# Patient Record
Sex: Female | Born: 1935 | Race: White | Hispanic: No | Smoking: Former smoker
Health system: Southern US, Community
[De-identification: ages and names within clinical notes are randomized; demographics above are authoritative.]

## PROBLEM LIST (undated history)

## (undated) DIAGNOSIS — K219 Gastro-esophageal reflux disease without esophagitis: Secondary | ICD-10-CM

## (undated) DIAGNOSIS — I714 Abdominal aortic aneurysm, without rupture, unspecified: Secondary | ICD-10-CM

## (undated) DIAGNOSIS — I10 Essential (primary) hypertension: Secondary | ICD-10-CM

## (undated) DIAGNOSIS — I2510 Atherosclerotic heart disease of native coronary artery without angina pectoris: Secondary | ICD-10-CM

## (undated) HISTORY — PX: CHOLECYSTECTOMY: SHX55

## (undated) HISTORY — DX: Atherosclerotic heart disease of native coronary artery without angina pectoris: I25.10

## (undated) HISTORY — DX: Gastro-esophageal reflux disease without esophagitis: K21.9

## (undated) HISTORY — DX: Abdominal aortic aneurysm, without rupture, unspecified: I71.40

## (undated) HISTORY — PX: SMALL BOWEL REPAIR: SHX6447

## (undated) HISTORY — DX: Abdominal aortic aneurysm, without rupture: I71.4

---

## 2013-08-25 ENCOUNTER — Other Ambulatory Visit: Payer: Self-pay | Admitting: Family Medicine

## 2013-08-25 DIAGNOSIS — I714 Abdominal aortic aneurysm, without rupture, unspecified: Secondary | ICD-10-CM

## 2013-08-30 ENCOUNTER — Ambulatory Visit
Admission: RE | Admit: 2013-08-30 | Discharge: 2013-08-30 | Disposition: A | Discharge status: Home / Self Care | Payer: Medicare Other | Source: Ambulatory Visit | Attending: Family Medicine | Admitting: Family Medicine

## 2013-08-30 DIAGNOSIS — I714 Abdominal aortic aneurysm, without rupture, unspecified: Secondary | ICD-10-CM

## 2014-08-01 ENCOUNTER — Other Ambulatory Visit: Payer: Self-pay | Admitting: Family Medicine

## 2014-08-01 DIAGNOSIS — I714 Abdominal aortic aneurysm, without rupture, unspecified: Secondary | ICD-10-CM

## 2014-08-05 ENCOUNTER — Ambulatory Visit
Admission: RE | Admit: 2014-08-05 | Discharge: 2014-08-05 | Disposition: A | Discharge status: Home / Self Care | Payer: Medicare Other | Source: Ambulatory Visit | Attending: Family Medicine | Admitting: Family Medicine

## 2014-08-05 ENCOUNTER — Other Ambulatory Visit: Payer: Self-pay | Admitting: Family Medicine

## 2014-08-05 DIAGNOSIS — I714 Abdominal aortic aneurysm, without rupture, unspecified: Secondary | ICD-10-CM

## 2014-08-09 ENCOUNTER — Telehealth: Payer: Self-pay | Admitting: Internal Medicine

## 2014-08-09 NOTE — Telephone Encounter (Signed)
Received records from [facility] for appointment with Dr Rennis GoldenHilty on 09/08/14.  Records given to [employer] [facility] (medical records) for Dr [facility]'s schedule on 09/08/14. lp

## 2014-08-18 ENCOUNTER — Encounter: Payer: Self-pay | Admitting: Vascular Surgery

## 2014-09-05 ENCOUNTER — Encounter: Payer: Self-pay | Admitting: Vascular Surgery

## 2014-09-06 ENCOUNTER — Encounter: Payer: Self-pay | Admitting: Vascular Surgery

## 2014-09-06 ENCOUNTER — Ambulatory Visit (INDEPENDENT_AMBULATORY_CARE_PROVIDER_SITE_OTHER): Payer: Medicare Other | Admitting: Vascular Surgery

## 2014-09-06 VITALS — BP 178/80 | HR 49 | Ht 61.0 in | Wt 137.6 lb

## 2014-09-06 DIAGNOSIS — I714 Abdominal aortic aneurysm, without rupture, unspecified: Secondary | ICD-10-CM | POA: Insufficient documentation

## 2014-09-06 NOTE — Addendum Note (Signed)
Addended by: Sharee PimpleMCCHESNEY, MARILYN K on: 09/06/2014 03:01 PM   Modules accepted: Orders

## 2014-09-06 NOTE — Progress Notes (Signed)
Subjective:     Patient ID: Krystal Patrick, female   DOB: 08-09-1920, 81 y.o.   MRN: [id number]  HPI this 81 year old female was referred for evaluation of abdominal aortic aneurysm. This has been followed on ultrasound exams for the past 2 years. Apparently the diameter has increased from 3.1 cm in 2015 to 4.5 cm in April 2016. She had previously had an ultrasound in [place] which revealed the aneurysm to be 3.1 cm. She has no recent history of increasing abdominal pain. She does have chronic back pain due to osteoarthritis. She ambulates with a walker.  Past Medical History  Diagnosis Date  . AAA (abdominal aortic aneurysm)   . GERD (gastroesophageal reflux disease)   . CAD (coronary artery disease)     History  Substance Use Topics  . Smoking status: Former Smoker    Types: Cigarettes    Quit date: 09/05/1989  . Smokeless tobacco: Not on file  . Alcohol Use: No    Family History  Problem Relation Age of Onset  . Heart disease Father   . Heart attack Father   . Diabetes Daughter   . Hypertension Daughter     Allergies  Allergen Reactions  . Penicillins Rash     Current outpatient prescriptions:  .  alendronate (FOSAMAX) 70 MG tablet, Take 70 mg by mouth once a week. Take with a full glass of water on an empty stomach., Disp: , Rfl:  .  amLODipine (NORVASC) 5 MG tablet, Take 5 mg by mouth daily., Disp: , Rfl:  .  atorvastatin (LIPITOR) 10 MG tablet, Take 10 mg by mouth daily., Disp: , Rfl:  .  citalopram (CELEXA) 10 MG tablet, Take 10 mg by mouth daily. 1 or 2 tablets daily for depression, Disp: , Rfl:  .  furosemide (LASIX) 20 MG tablet, Take 20 mg by mouth daily., Disp: , Rfl:  .  lisinopril (PRINIVIL,ZESTRIL) 40 MG tablet, Take 40 mg by mouth daily., Disp: , Rfl:  .  metoprolol succinate (TOPROL-XL) 25 MG 24 hr tablet, Take 25 mg by mouth daily., Disp: , Rfl:   Filed Vitals:   09/06/14 0952  BP: 178/80  Pulse: 49  Height:  (1.549 m)  Weight: 137 lb 9.6 oz  (62.415 kg)  SpO2: 96%    Body mass index is 26.01 kg/(m^2).         Review of Systems denies chest pain. It is unstable with ambulation. Has not smoked in 25 years. Has no history of lateralizing weakness, a fascia, amaurosis fugax, diplopia, or syncope. Has 2 remote myocardial infarctions many years ago but has not had stents or surgery. Other systems negative and a complete review of systems     Objective:   Physical Exam BP 178/80 mmHg  Pulse 49  Ht  (1.549 m)  Wt 137 lb 9.6 oz (62.415 kg)  BMI 26.01 kg/m2  SpO2 96%  Gen.-alert and oriented x3 in no apparent distress-elderly-walks with a walker HEENT normal for age Lungs no rhonchi or wheezing Cardiovascular regular rhythm no murmurs carotid pulses 3+ palpable no bruits audible Abdomen soft nontender no palpable masses Musculoskeletal free of  major deformities Skin clear -no rashes Neurologic normal Lower extremities 3+ femoral and dorsalis pedis pulses palpable bilaterally with no edema  Today I reviewed the abdominal ultrasound which was performed a few weeks ago and compared this to the results of the study done in April 2015. Ultrasound now reveals aneurysm to be 4.5 cm in maximum diameter and  the report states that it was 3.1 cm in April 2015.       Assessment:     4.5 cm abdominal aortic aneurysm by ultrasound-has enlarged from 3.1 cm one year ago according to previous ultrasound report Coronary artery disease with remote history of 2 myocardial infarctions Ambulates with walker because of chronic back discomfort and osteoarthritis    Plan:     Discuss situation at length with patient and her daughter Have decided to have her return in 6 months with duplex scan of abdominal aortic aneurysm in our office and see nurse practitioner to look for any enlargement Have not recommended any surgical treatment but patient has stated that she does not want open surgery which I would agree with If enlargement  occurs to a significant amount may obtain a CT angiogram with contrast to see if she is candidate for stent graft but we'll have to await to see the results of duplex scan in 6 months Patient and daughter are in agreement with this plan

## 2014-09-08 ENCOUNTER — Ambulatory Visit: Payer: Medicare Other | Admitting: Internal Medicine

## 2015-02-08 ENCOUNTER — Other Ambulatory Visit: Payer: Self-pay | Admitting: Family Medicine

## 2015-02-08 DIAGNOSIS — G459 Transient cerebral ischemic attack, unspecified: Secondary | ICD-10-CM

## 2015-02-09 ENCOUNTER — Ambulatory Visit
Admission: RE | Admit: 2015-02-09 | Discharge: 2015-02-09 | Disposition: A | Discharge status: Home / Self Care | Payer: Medicare Other | Source: Ambulatory Visit | Attending: Family Medicine | Admitting: Family Medicine

## 2015-02-09 DIAGNOSIS — G459 Transient cerebral ischemic attack, unspecified: Secondary | ICD-10-CM

## 2015-02-12 ENCOUNTER — Ambulatory Visit
Admission: RE | Admit: 2015-02-12 | Discharge: 2015-02-12 | Disposition: A | Discharge status: Home / Self Care | Payer: Medicare Other | Source: Ambulatory Visit | Attending: Family Medicine | Admitting: Family Medicine

## 2015-02-12 DIAGNOSIS — G459 Transient cerebral ischemic attack, unspecified: Secondary | ICD-10-CM

## 2015-02-16 ENCOUNTER — Encounter: Payer: Self-pay | Admitting: Family

## 2015-02-21 ENCOUNTER — Encounter: Payer: Self-pay | Admitting: Vascular Surgery

## 2015-02-21 ENCOUNTER — Ambulatory Visit (INDEPENDENT_AMBULATORY_CARE_PROVIDER_SITE_OTHER): Payer: Medicare Other | Admitting: Vascular Surgery

## 2015-02-21 VITALS — BP 170/67 | HR 72 | Temp 98.6°F | Resp 14 | Ht 63.0 in | Wt 130.0 lb

## 2015-02-21 DIAGNOSIS — G459 Transient cerebral ischemic attack, unspecified: Secondary | ICD-10-CM

## 2015-02-21 NOTE — Progress Notes (Signed)
Subjective:     Patient ID: Krystal Patrick, female   DOB: 21-May-1920, 81 y.o.   MRN: [id number]  HPI This 81 year old female is known to me having seen her recently for Krystal  Small abdominal aortic aneurysm. She is due to return next month for further ultrasound exam. Several days ago she had an episode of transient Krystal Patrick with garbled speech lasting only Krystal few minutes according to her daughter. She had no lateralizing weakness or loss of vision or syncopal episodes or other symptoms. Her symptoms cleared completely. She was taking one aspirin per day at that time. She has no history of previous stroke or TIA. She had an MRI scan which revealed no evidence of acute stroke. She also had carotid ultrasound which revealed some mild bilateral carotid occlusive disease in the 20-50% range.  Past Medical History  Diagnosis Date  . AAA (abdominal aortic aneurysm) (HCC)   . GERD (gastroesophageal reflux disease)   . CAD (coronary artery disease)     Social History  Substance Use Topics  . Smoking status: Former Smoker    Types: Cigarettes    Quit date: 09/05/1989  . Smokeless tobacco: Never Used  . Alcohol Use: No    Family History  Problem Relation Age of Onset  . Heart disease Father   . Heart attack Father   . Diabetes Daughter   . Hypertension Daughter     Allergies  Allergen Reactions  . Penicillins Rash     Current outpatient prescriptions:  .  alendronate (FOSAMAX) 70 MG tablet, Take 70 mg by mouth once Krystal week. Take with Krystal full glass of water on an empty stomach., Disp: , Rfl:  .  amLODipine (NORVASC) 5 MG tablet, Take 5 mg by mouth daily., Disp: , Rfl:  .  atorvastatin (LIPITOR) 10 MG tablet, Take 10 mg by mouth daily., Disp: , Rfl:  .  citalopram (CELEXA) 10 MG tablet, Take 10 mg by mouth daily. 1 or 2 tablets daily for depression, Disp: , Rfl:  .  furosemide (LASIX) 20 MG tablet, Take 20 mg by mouth daily., Disp: , Rfl:  .  lisinopril (PRINIVIL,ZESTRIL) 40 MG  tablet, Take 40 mg by mouth daily., Disp: , Rfl:  .  metoprolol succinate (TOPROL-XL) 25 MG 24 hr tablet, Take 25 mg by mouth daily., Disp: , Rfl:   Filed Vitals:   02/21/15 1144 02/21/15 1147  BP: 140/101 170/67  Pulse: 53 72  Temp: 98.6 F (37 C)   Resp: 14   Height:  (1.6 m)   Weight: 130 lb (58.968 kg)   SpO2: 97%     Body mass index is 23.03 kg/(m^2).            Review of Systems Denies chest pain, dyspnea on exertion, PND, orthopnea, hemoptysis     Objective:   Physical Exam BP 170/67 mmHg  Pulse 72  Temp(Src) 98.6 F (37 C)  Resp 14  Ht  (1.6 m)  Wt 130 lb (58.968 kg)  BMI 23.03 kg/m2  SpO2 97%  Gen.-alert and oriented x3 in no apparent distress HEENT normal for age Lungs no rhonchi or wheezing Cardiovascular regular rhythm no murmurs carotid pulses 3+ palpable no bruits audible Abdomen soft nontender no palpable masses Musculoskeletal free of  major deformities Skin clear -no rashes Neurologic normal Lower extremities 3+ femoral and dorsalis pedis pulses palpable bilaterally with no edema   today I reviewed the clinical records supplied by Dr. Jeannetta Nap including the report  of the carotid ultrasound and the MRI study.   the results of these is outlined in the history of present illness      Assessment:      suspected left brain TIA lasting only Krystal few minutes which consisted of garbled speech which rapidly cleared -etiology unknown Mild bilateral carotid occlusive disease   Small abdominal aortic aneurysm    Plan:      certainly not Krystal lot of carotid occlusive disease to implicate left carotid stenosis and this left brain TIA. It is possible that she embolized Krystal small piece of debris from plaque in her left carotid artery which is not severely stenotic   I would recommend continuing daily aspirin. If she has another TIA I would add Plavix 75 mg pe day.  She will return to our office in 1 month for continued follow-up of the small  abdominal aortic aneurysm.

## 2015-03-07 ENCOUNTER — Encounter: Payer: Medicare Other | Admitting: Vascular Surgery

## 2015-03-10 ENCOUNTER — Encounter: Payer: Self-pay | Admitting: Family

## 2015-03-14 ENCOUNTER — Ambulatory Visit (INDEPENDENT_AMBULATORY_CARE_PROVIDER_SITE_OTHER): Payer: Medicare Other | Admitting: Family

## 2015-03-14 ENCOUNTER — Encounter: Payer: Self-pay | Admitting: Family

## 2015-03-14 ENCOUNTER — Ambulatory Visit (HOSPITAL_COMMUNITY)
Admission: RE | Admit: 2015-03-14 | Discharge: 2015-03-14 | Disposition: A | Discharge status: Home / Self Care | Payer: Medicare Other | Source: Ambulatory Visit | Attending: Family | Admitting: Family

## 2015-03-14 VITALS — BP 185/80 | HR 47 | Ht 63.0 in | Wt 131.0 lb

## 2015-03-14 DIAGNOSIS — I714 Abdominal aortic aneurysm, without rupture, unspecified: Secondary | ICD-10-CM

## 2015-03-14 DIAGNOSIS — G459 Transient cerebral ischemic attack, unspecified: Secondary | ICD-10-CM

## 2015-03-14 NOTE — Progress Notes (Signed)
[facility] [place]  Established Abdominal Aortic Aneurysm  History of Present Illness  Krystal Patrick is a 81 y.o. ([birth year]) female patient of Dr. Hart Rochester who is monitored for a small abdominal aortic aneurysm. She returns today for follow up. She last saw Dr. Hart Rochester in October 2016.  Several days prior to this visit she had an episode of transient ischemic attack with garbled speech lasting only a few minutes according to her daughter. She had no lateralizing weakness or loss of vision or syncopal episodes or other symptoms. Her symptoms cleared completely. She was taking one aspirin per day at that time. She has no history of previous stroke or TIA. She had an MRI scan which revealed no evidence of acute stroke. She also had carotid ultrasound which revealed some mild bilateral carotid occlusive disease in the 20-50% range.  Pt and daughter in law deny any subsequent TIA's.  Daughter in law states her mother in law's blood pressure runs 140/60 approximately. Pt has osteoporosis and chronic back pain from this. She denies abdominal pain.  Pt Diabetic: No Pt smoker: former smoker, quit about 1980  She takes a daily ASA and a statin.  Past Medical History  Diagnosis Date  . AAA (abdominal aortic aneurysm) (HCC)   . GERD (gastroesophageal reflux disease)   . CAD (coronary artery disease)    Past Surgical History  Procedure Laterality Date  . Cholecystectomy    . Small bowel repair     Social History Social History   Social History  . Marital Status: Widowed    Spouse Name: N/A  . Number of Children: N/A  . Years of Education: N/A   Occupational History  . Not on file.   Social History Main Topics  . Smoking status: Former Smoker    Types: Cigarettes    Quit date: 09/05/1989  . Smokeless tobacco: Never Used  . Alcohol Use: No  . Drug Use: No  . Sexual Activity: Not on file   Other Topics Concern  . Not on file   Social History Narrative    Family History Family History  Problem Relation Age of Onset  . Heart disease Father   . Heart attack Father   . Diabetes Daughter   . Hypertension Daughter     Current Outpatient Prescriptions on File Prior to Visit  Medication Sig Dispense Refill  . alendronate (FOSAMAX) 70 MG tablet Take 70 mg by mouth once a week. Take with a full glass of water on an empty stomach.    Marland Kitchen amLODipine (NORVASC) 5 MG tablet Take 5 mg by mouth daily.    Marland Kitchen atorvastatin (LIPITOR) 10 MG tablet Take 10 mg by mouth daily.    . citalopram (CELEXA) 10 MG tablet Take 10 mg by mouth daily. 1 or 2 tablets daily for depression    . furosemide (LASIX) 20 MG tablet Take 20 mg by mouth daily.    Marland Kitchen lisinopril (PRINIVIL,ZESTRIL) 40 MG tablet Take 40 mg by mouth daily.    . metoprolol succinate (TOPROL-XL) 25 MG 24 hr tablet Take 25 mg by mouth daily.     No current facility-administered medications on file prior to visit.   Allergies  Allergen Reactions  . Penicillins Rash    ROS: See HPI for pertinent positives and negatives.  Physical Examination  Filed Vitals:   03/14/15 0840  BP: 219/85  Pulse: 47  Height:  (1.6 m)  Weight: 131 lb (59.421 kg)  SpO2: 95%  Body mass index is 23.21 kg/(m^2).  General: A&O x 3, WD. Appears considerably younger than stated age.  Pulmonary: Sym exp, good air movt, CTAB, no rales, rhonchi, or wheezing.  Cardiac: RRR, Nl S1, S2, no detected murmur.   Carotid Bruits Right Left   Negative Negative   Aorta is not palpable Radial pulses are 2+ palpable and =.                          VASCULAR EXAM:                                                                                                         LE Pulses Right Left       FEMORAL  2+ palpable  2+ palpable        POPLITEAL  not palpable   not palpable       POSTERIOR TIBIAL  not palpable   2+ palpable        DORSALIS PEDIS      ANTERIOR TIBIAL not palpable  not palpable      Gastrointestinal:  soft, NTND, -G/R, - HSM, - masses palpated, - CVAT B.  Musculoskeletal: M/S 4/5 throughout extremities without ischemic changes.  Neurologic: CN 2-12 intact except is hard of hearing, Pain and light touch intact in extremities are intact, Motor exam as listed above.   Non-Invasive Vascular Imaging  AAA Duplex (03/14/2015) ABDOMINAL AORTA DUPLEX EVALUATION    INDICATION: Evaluation of known abdominal aortic aneurysm    PREVIOUS INTERVENTION(S):     DUPLEX EXAM:     LOCATION DIAMETER AP (cm) DIAMETER TRANSVERSE (cm) VELOCITIES (cm/sec)  Aorta Proximal 2.63 2.09 66  Aorta Mid 3.44 3.36 126  Aorta Distal 3.09 3.17 32  Right Common Iliac Artery 0.9 1.29 176  Left Common Iliac Artery 1.0 0.8 126    Previous max aortic diameter:   Date:      ADDITIONAL FINDINGS: Difficult exam due to significant bowel gas.    IMPRESSION: Abdominal aortic aneurysm measuring 3.44 cm AP x 3.36 cm in the largest transverse measurement.      Compared to the previous exam:  There are no previous exam.     Medical Decision Making  The patient is a 81 y.o. female who presents with asymptomatic small AAA, largest diameter measuring 3.44 cm.  At pt's October 2016 visit she had a carotid duplex performed.  Dr. Hart Rochester indicated there was not a lot of carotid occlusive disease to implicate left carotid stenosis and this left brain TIA. It is possible that she embolized a small piece of debris from plaque in her left carotid artery which is not severely stenotic.  Dr. Hart Rochester recommended continuing daily aspirin. If she has another TIA he would add Plavix 75 mg pe day. No further carotid studies recommended.  I advised pt and her daughter in law that pt needs to see her PCP ASAP re her elevated blood pressure.   Based on this patient's exam and diagnostic studies, the patient will follow up in  1 year  with the following studies: AAA duplex.  Consideration for repair of AAA would be made when the size is 5.5  cm, growth > 1 cm/yr, and symptomatic status.  I emphasized the importance of maximal medical management including strict control of blood pressure, blood glucose, and lipid levels, antiplatelet agents, obtaining regular exercise, and continued cessation of smoking.   The patient was given information about AAA including signs, symptoms, treatment, and how to minimize the risk of enlargement and rupture of aneurysms.    The patient was advised to call 911 should the patient experience sudden onset abdominal or back pain.   Thank you for allowing us to participate in this patient's care.  Charisse MarchSuzanne Nickel, RN, MSN, FNP-C [facility] [place] Office: [phone]  Clinic Physician: Hart RochesterLawson  03/14/2015, 8:50 AM    \

## 2015-03-14 NOTE — Patient Instructions (Signed)
Abdominal Aortic Aneurysm An aneurysm is a weakened or damaged part of an artery wall that bulges from the normal force of blood pumping through the body. An abdominal aortic aneurysm is an aneurysm that occurs in the lower part of the aorta, the main artery of the body.  The major concern with an abdominal aortic aneurysm is that it can enlarge and burst (rupture) or blood can flow between the layers of the wall of the aorta through a tear (aorticdissection). Both of these conditions can cause bleeding inside the body and can be life threatening unless diagnosed and treated promptly. CAUSES  The exact cause of an abdominal aortic aneurysm is unknown. Some contributing factors are:   A hardening of the arteries caused by the buildup of fat and other substances in the lining of a blood vessel (arteriosclerosis).  Inflammation of the walls of an artery (arteritis).   Connective tissue diseases, such as Marfan syndrome.   Abdominal trauma.   An infection, such as syphilis or staphylococcus, in the wall of the aorta (infectious aortitis) caused by bacteria. RISK FACTORS  Risk factors that contribute to an abdominal aortic aneurysm may include:  Age older than 60 years.   High blood pressure (hypertension).  Female gender.  Ethnicity (white race).  Obesity.  Family history of aneurysm (first degree relatives only).  Tobacco use. PREVENTION  The following healthy lifestyle habits may help decrease your risk of abdominal aortic aneurysm:  Quitting smoking. Smoking can raise your blood pressure and cause arteriosclerosis.  Limiting or avoiding alcohol.  Keeping your blood pressure, blood sugar level, and cholesterol levels within normal limits.  Decreasing your salt intake. In somepeople, too much salt can raise blood pressure and increase your risk of abdominal aortic aneurysm.  Eating a diet low in saturated fats and cholesterol.  Increasing your fiber intake by including  whole grains, vegetables, and fruits in your diet. Eating these foods may help lower blood pressure.  Maintaining a healthy weight.  Staying physically active and exercising regularly. SYMPTOMS  The symptoms of abdominal aortic aneurysm may vary depending on the size and rate of growth of the aneurysm.Most grow slowly and do not have any symptoms. When symptoms do occur, they may include:  Pain (abdomen, side, lower back, or groin). The pain may vary in intensity. A sudden onset of severe pain may indicate that the aneurysm has ruptured.  Feeling full after eating only small amounts of food.  Nausea or vomiting or both.  Feeling a pulsating lump in the abdomen.  Feeling faint or passing out. DIAGNOSIS  Since most unruptured abdominal aortic aneurysms have no symptoms, they are often discovered during diagnostic exams for other conditions. An aneurysm may be found during the following procedures:  Ultrasonography (A one-time screening for abdominal aortic aneurysm by ultrasonography is also recommended for all men aged 65-75 years who have ever smoked).  X-ray exams.  A computed tomography (CT).  Magnetic resonance imaging (MRI).  Angiography or arteriography. TREATMENT  Treatment of an abdominal aortic aneurysm depends on the size of your aneurysm, your age, and risk factors for rupture. Medication to control blood pressure and pain may be used to manage aneurysms smaller than 6 cm. Regular monitoring for enlargement may be recommended by your caregiver if:  The aneurysm is 3-4 cm in size (an annual ultrasonography may be recommended).  The aneurysm is 4-4.5 cm in size (an ultrasonography every 6 months may be recommended).  The aneurysm is larger than 4.5 cm in   size (your caregiver may ask that you be examined by a vascular surgeon). If your aneurysm is larger than 6 cm, surgical repair may be recommended. There are two main methods for repair of an aneurysm:   Endovascular  repair (a minimally invasive surgery). This is done most often.  Open repair. This method is used if an endovascular repair is not possible.   This information is not intended to replace advice given to you by your health care provider. Make sure you discuss any questions you have with your health care provider.   Document Released: 01/30/2005 Document Revised: 08/17/2012 Document Reviewed: 05/22/2012 Elsevier Interactive Patient Education 2016 [employer]  

## 2016-03-13 ENCOUNTER — Encounter: Payer: Self-pay | Admitting: Family

## 2016-03-15 ENCOUNTER — Encounter: Payer: Self-pay | Admitting: Family

## 2016-03-19 ENCOUNTER — Ambulatory Visit (INDEPENDENT_AMBULATORY_CARE_PROVIDER_SITE_OTHER): Payer: Medicare Other | Admitting: Family

## 2016-03-19 ENCOUNTER — Encounter: Payer: Self-pay | Admitting: Family

## 2016-03-19 ENCOUNTER — Ambulatory Visit: Payer: Medicare Other | Admitting: Family

## 2016-03-19 ENCOUNTER — Ambulatory Visit (HOSPITAL_COMMUNITY)
Admission: RE | Admit: 2016-03-19 | Discharge: 2016-03-19 | Disposition: A | Discharge status: Home / Self Care | Payer: Medicare Other | Source: Ambulatory Visit | Attending: Family | Admitting: Family

## 2016-03-19 ENCOUNTER — Other Ambulatory Visit (HOSPITAL_COMMUNITY): Payer: Medicare Other

## 2016-03-19 VITALS — BP 139/69 | HR 61 | Temp 97.7°F | Resp 18 | Ht 63.0 in | Wt 128.8 lb

## 2016-03-19 DIAGNOSIS — I714 Abdominal aortic aneurysm, without rupture, unspecified: Secondary | ICD-10-CM

## 2016-03-19 DIAGNOSIS — G459 Transient cerebral ischemic attack, unspecified: Secondary | ICD-10-CM | POA: Diagnosis present

## 2016-03-19 NOTE — Addendum Note (Signed)
Addended by: Sharee PimpleMCCHESNEY, MARILYN K on: 03/19/2016 04:27 PM   Modules accepted: Orders

## 2016-03-19 NOTE — Progress Notes (Signed)
[facility]   CC: Follow up Abdominal Aortic Aneurysm  History of Present Illness  Krystal SavoyBetty Patrick is a 82 y.o. (Jul 23, 1920) female patient of Dr. Hart RochesterLawson who is monitored for a small abdominal aortic aneurysm. She returns today for follow up. She last saw Dr. Hart RochesterLawson in October 2016.  Several days prior to this visit she had an episode of transient ischemic attack with garbled speech lasting only a few minutes according to her daughter. She had no lateralizing weakness or loss of vision or syncopal episodes or other symptoms. Her symptoms cleared completely. She was taking one aspirin per day at that time. She has no history of previous stroke or TIA. She had an MRI scan which revealed no evidence of acute stroke. She also had carotid ultrasound which revealed some mild bilateral carotid occlusive disease in the 20-50% range.  Pt and daughter in law deny any subsequent TIA's.  Daughter in law states her mother in law's blood pressure runs 140/60 approximately. Pt has osteoporosis and chronic back pain from this. She denies abdominal pain.  Pt Diabetic: No Pt smoker: former smoker, quit about 1980  She takes a daily ASA and a statin.    Past Medical History:  Diagnosis Date  . AAA (abdominal aortic aneurysm) (HCC)   . CAD (coronary artery disease)   . GERD (gastroesophageal reflux disease)    Past Surgical History:  Procedure Laterality Date  . CHOLECYSTECTOMY    . SMALL BOWEL REPAIR     Social History Social History   Social History  . Marital status: Widowed    Spouse name: N/A  . Number of children: N/A  . Years of education: N/A   Occupational History  . Not on file.   Social History Main Topics  . Smoking status: Former Smoker    Types: Cigarettes    Quit date: 09/05/1989  . Smokeless tobacco: Never Used  . Alcohol use No  . Drug use: No  . Sexual activity: Not on file   Other Topics Concern  . Not on file   Social History  Narrative  . No narrative on file   Family History Family History  Problem Relation Age of Onset  . Heart disease Father   . Heart attack Father   . Diabetes Daughter   . Hypertension Daughter     Current Outpatient Prescriptions on File Prior to Visit  Medication Sig Dispense Refill  . alendronate (FOSAMAX) 70 MG tablet Take 70 mg by mouth once a week. Take with a full glass of water on an empty stomach.    Marland Kitchen. amLODipine (NORVASC) 5 MG tablet Take 5 mg by mouth daily.    Marland Kitchen. atorvastatin (LIPITOR) 10 MG tablet Take 10 mg by mouth daily.    . citalopram (CELEXA) 10 MG tablet Take 10 mg by mouth daily. 1 or 2 tablets daily for depression    . furosemide (LASIX) 20 MG tablet Take 20 mg by mouth daily.    Marland Kitchen. lisinopril (PRINIVIL,ZESTRIL) 40 MG tablet Take 40 mg by mouth daily.    . metoprolol succinate (TOPROL-XL) 25 MG 24 hr tablet Take 25 mg by mouth daily.     No current facility-administered medications on file prior to visit.    Allergies  Allergen Reactions  . Penicillins Rash    ROS: See HPI for pertinent positives and negatives.  Physical Examination  Vitals:   03/19/16 0936  BP: 139/69  Pulse: 61  Resp: 18  Temp: 97.7  F (36.5 C)  TempSrc: Oral  SpO2: 96%  Weight: 128 lb 12.8 oz (58.4 kg)  Height: 5\' 3"  (1.6 m)   Body mass index is 22.82 kg/m.  General: A&O x 3, WD. Appears considerably younger than stated age.  Pulmonary: Sym exp, respirations are non labored, good air movt, CTAB, no rales, rhonchi, or wheezing.  Cardiac: RRR, Nl S1, S2, no detected murmur.   Carotid Bruits Right Left   positive Negative   Aorta is not palpable Radial pulses are 2+ palpable and =.                          VASCULAR EXAM:                                                                                                                                           LE Pulses Right Left       FEMORAL  2+ palpable  2+ palpable       POPLITEAL  not palpable  not palpable        POSTERIOR TIBIAL  not palpable  1+ palpable       DORSALIS PEDIS      ANTERIOR TIBIAL not palpable not palpable     Gastrointestinal: soft, NTND, -G/R, - HSM, - masses palpated, - CVAT B.  Musculoskeletal: M/S 4/5 throughout extremities without ischemic changes.  Neurologic: CN 2-12 intact except is hard of hearing, Pain and light touch intact in extremities are intact, Motor exam as listed above.    Non-Invasive Vascular Imaging  AAA Duplex (03/19/2016)  Previous size: 3.44 cm (Date: 03-14-15)  Current size:  3.5 cm (Date: 03-19-16) Decreased visualization due to overlying bowel gas and patient body habitus.   No significant stenosis of the abdominal aorta and bilateral proximal common iliac arteries. Normal diameters of the bilateral CIA's.   Medical Decision Making  The patient is a 82 y.o. female who presents with asymptomatic AAA with no increase in size, largest diameter of abdominal aorta is 3.5 cm, based on limited visualization. Pt and daughter in las with that pt return in a year instead of 18 months.   At pt's October 2016 visit she had a carotid duplex performed.  Dr. Hart RochesterLawson indicated there was not a lot of carotid occlusive disease to implicate left carotid stenosis and this left brain TIA. It is possible that she embolized a small piece of debris from plaque in her left carotid artery which is not severely stenotic.  Dr. Hart RochesterLawson recommended continuing daily aspirin. If she has another TIA he would add Plavix 75 mg pe day. No further carotid studies recommended.    Based on this patient's exam and diagnostic studies, the patient will follow up in 1 year  with the following studies: AAA duplex.  Consideration for repair of AAA would be made when the size  is 5.0 cm, growth > 1 cm/yr, and symptomatic status.  I emphasized the importance of maximal medical management including strict control of blood pressure, blood glucose, and lipid levels, antiplatelet agents,  obtaining regular exercise, and continued cessation of smoking.   The patient was given information about AAA including signs, symptoms, treatment, and how to minimize the risk of enlargement and rupture of aneurysms.    The patient was advised to call 911 should the patient experience sudden onset abdominal or back pain.   Thank you for allowing [place] to participate in this patient's care.  Charisse March, RN, MSN, FNP-C [facility] Office: [phone]  Clinic Physician: Edilia Bo on call  03/19/2016, 9:40 AM

## 2016-03-19 NOTE — Patient Instructions (Addendum)

## 2016-04-08 ENCOUNTER — Ambulatory Visit
Admission: RE | Admit: 2016-04-08 | Discharge: 2016-04-08 | Disposition: A | Discharge status: Home / Self Care | Payer: Medicare Other | Source: Ambulatory Visit | Attending: Family Medicine | Admitting: Family Medicine

## 2016-04-08 ENCOUNTER — Other Ambulatory Visit: Payer: Self-pay | Admitting: Family Medicine

## 2016-04-08 DIAGNOSIS — M549 Dorsalgia, unspecified: Secondary | ICD-10-CM

## 2017-03-25 ENCOUNTER — Encounter: Payer: Self-pay | Admitting: Family

## 2017-03-25 ENCOUNTER — Ambulatory Visit (HOSPITAL_COMMUNITY)
Admission: RE | Admit: 2017-03-25 | Discharge: 2017-03-25 | Disposition: A | Discharge status: Home / Self Care | Payer: Medicare Other | Source: Ambulatory Visit | Attending: Family | Admitting: Family

## 2017-03-25 ENCOUNTER — Ambulatory Visit (INDEPENDENT_AMBULATORY_CARE_PROVIDER_SITE_OTHER): Payer: Medicare Other | Admitting: Family

## 2017-03-25 VITALS — BP 180/76 | HR 53 | Temp 97.3°F | Resp 17 | Wt 132.9 lb

## 2017-03-25 DIAGNOSIS — I714 Abdominal aortic aneurysm, without rupture, unspecified: Secondary | ICD-10-CM

## 2017-03-25 DIAGNOSIS — I7409 Other arterial embolism and thrombosis of abdominal aorta: Secondary | ICD-10-CM | POA: Diagnosis not present

## 2017-03-25 NOTE — Progress Notes (Signed)
[facility] [place]   CC: Follow up Abdominal Aortic Aneurysm  History of Present Illness  Krystal Patrick is a 83 y.o. (December 20, 1920) female whom Dr. Hart RochesterLawson had been monitoring for a small abdominal aortic aneurysm. She returns today for follow up. She last saw Dr. Hart RochesterLawson in October 2016. Several days prior to this visit she had an episode of transient ischemic attack with garbled speech lasting only a few minutes according to her daughter. She had no lateralizing weakness or loss of vision or syncopal episodes or other symptoms. Her symptoms cleared completely. She was taking one aspirin per day at that time. She has no history of previous stroke or TIA. She had an MRI scan which revealed no evidence of acute stroke. She also had carotid ultrasound which revealed some mild bilateral carotid occlusive disease in the 20-50% range.   Pt and daughter in law deny any subsequent TIA's.  Daughter in law states her mother in law's blood pressure runs 140/60 approximately. Pt states she feels anxious today.  Pt has osteoporosis and chronic back pain from this. She denies abdominal pain.  Pt Diabetic: No Pt smoker: former smoker, quit about 1980  She takes a daily ASA and a statin   Past Medical History:  Diagnosis Date  . AAA (abdominal aortic aneurysm) (HCC)   . CAD (coronary artery disease)   . GERD (gastroesophageal reflux disease)    Past Surgical History:  Procedure Laterality Date  . CHOLECYSTECTOMY    . SMALL BOWEL REPAIR     Social History Social History   Socioeconomic History  . Marital status: Widowed    Spouse name: Not on file  . Number of children: Not on file  . Years of education: Not on file  . Highest education level: Not on file  Social Needs  . Financial resource strain: Not on file  . Food insecurity - worry: Not on file  . Food insecurity - inability: Not on file  . Transportation needs - medical: Not on file  . Transportation  needs - non-medical: Not on file  Occupational History  . Not on file  Tobacco Use  . Smoking status: Former Smoker    Types: Cigarettes    Last attempt to quit: 09/05/1989    Years since quitting: 27.5  . Smokeless tobacco: Never Used  Substance and Sexual Activity  . Alcohol use: No    Alcohol/week: 0.0 oz  . Drug use: No  . Sexual activity: Not on file  Other Topics Concern  . Not on file  Social History Narrative  . Not on file   Family History Family History  Problem Relation Age of Onset  . Heart disease Father   . Heart attack Father   . Diabetes Daughter   . Hypertension Daughter     Current Outpatient Medications on File Prior to Visit  Medication Sig Dispense Refill  . alendronate (FOSAMAX) 70 MG tablet Take 70 mg by mouth once a week. Take with a full glass of water on an empty stomach.    Marland Kitchen. amLODipine (NORVASC) 5 MG tablet Take 5 mg by mouth daily.    Marland Kitchen. atorvastatin (LIPITOR) 10 MG tablet Take 10 mg by mouth daily.    . citalopram (CELEXA) 10 MG tablet Take 10 mg by mouth daily. 1 or 2 tablets daily for depression    . furosemide (LASIX) 20 MG tablet Take 20 mg by mouth daily.    Marland Kitchen. lisinopril (PRINIVIL,ZESTRIL) 40 MG tablet Take  40 mg by mouth daily.    . metoprolol succinate (TOPROL-XL) 25 MG 24 hr tablet Take 25 mg by mouth daily.     No current facility-administered medications on file prior to visit.    Allergies  Allergen Reactions  . Penicillins Rash    ROS: See HPI for pertinent positives and negatives.  Physical Examination  Vitals:   03/25/17 0843 03/25/17 0849  BP: (!) 170/80 (!) 180/76  Pulse: (!) 53   Resp: 17   Temp: (!) 97.3 F (36.3 C)   TempSrc: Oral   SpO2: 97%   Weight: 132 lb 14.4 oz (60.3 kg)    Body mass index is 23.54 kg/m.  General: A&O x 3, WD. Appears considerably younger than stated age.  Pulmonary: Sym exp, respirations are non labored, good air movt, CTAB, few scattered rales in bases, no rhonchi, or  wheezing.  Cardiac: RRR, Nl S1, S2, no detected murmur.   Carotid Bruits Right Left   negative Negative   Abdominal aortic pulse is not palpable Radial pulses are 2+ palpable and =.   VASCULAR EXAM:  LE Pulses Right Left  FEMORAL 2+ palpable 2+ palpable  POPLITEAL not palpable not palpable  POSTERIOR TIBIAL 1+palpable 1+ palpable  DORSALIS PEDIS ANTERIOR TIBIAL 2+ palpable not palpable     Gastrointestinal: soft, NTND, -G/R, - HSM, - masses palpated, - CVAT B.  Musculoskeletal: M/S 4/5 throughout extremities without ischemic changes.  Skin: No rashes, no cellulitis, no ulcers noted.   Neurologic: CN 2-12 intact except is hard of hearing, Pain and light touch intact in extremities are intact, Motor exam as listed above    DATA  AAA Duplex (03/25/2017):  Previous size: 3.5 cm (Date: 03-19-16) Decreased visualization due to overlying bowel gas and patient body habitus.   No significant stenosis of the abdominal aorta and bilateral proximal common iliac arteries. Normal diameters of the bilateral CIA's.    Current size:  3.1 cm (Date: 03/25/17), Right CIA: 1.1; Left CIA: 1.1. Decreased visualization of the abdominal vasculature due to overlying bowel gas and patient body habitus. Thrombus visualized in the distal aorta.   No significant change compared to the exam on 03-19-16.   Medical Decision Making  The patient is a 83 y.o. female who presents with asymptomatic small AAA with no increase in size, based on limited visualization, 3.1 cm today. .  Pt reports she has had a cough "for a long time", denies dyspnea, lung sounds with scattered rales in bases. I advised pt and her daughter that pt see her PCP about her lung congestion, cough, and elevated blood pressure.    Based on this patient's exam and diagnostic studies, the patient will follow up in 1 year  with the following studies: AAA  duplex.  Consideration for repair of AAA would be made when the size is 5.0 cm, growth > 1 cm/yr, and symptomatic status.        The patient was given information about AAA including signs, symptoms, treatment, and how to minimize the risk of enlargement and rupture of aneurysms.    I emphasized the importance of maximal medical management including strict control of blood pressure, blood glucose, and lipid levels, antiplatelet agents, obtaining regular exercise, and continued  cessation of smoking.   The patient was advised to call 911 should the patient experience sudden onset abdominal or back pain.   Thank you for allowing [place] to participate in this patient's care.  Rosalita Chessman Shaunak Kreis, RN, MSN, FNP-C [facility] [place] Office: [phone]  Clinic Physician: Myra GianottiBrabham on call  03/25/2017, 8:55 AM

## 2017-03-25 NOTE — Patient Instructions (Addendum)
Before your next abdominal ultrasound:  Take two Extra-Strength Gas-X capsules at bedtime the night before the test. Take another two Extra-Strength Gas-X capsules 3 hours before the test.  Avoid gas forming foods the day before the test.       Abdominal Aortic Aneurysm Blood pumps away from the heart through tubes (blood vessels) called arteries. Aneurysms are weak or damaged places in the wall of an artery. It bulges out like a balloon. An abdominal aortic aneurysm happens in the main artery of the body (aorta). It can burst or tear, causing bleeding inside the body. This is an emergency. It needs treatment right away. What are the causes? The exact cause is unknown. Things that could cause this problem include:  Fat and other substances building up in the lining of a tube.  Swelling of the walls of a blood vessel.  Certain tissue diseases.  Belly (abdominal) trauma.  An infection in the main artery of the body.  What increases the risk? There are things that make it more likely for you to have an aneurysm. These include:  Being over the age of 81 years old.  Having high blood pressure (hypertension).  Being a female.  Being white.  Being very overweight (obese).  Having a family history of aneurysm.  Using tobacco products.  What are the signs or symptoms? Symptoms depend on the size of the aneurysm and how fast it grows. There may not be symptoms. If symptoms occur, they can include:  Pain (belly, side, lower back, or groin).  Feeling full after eating a small amount of food.  Feeling sick to your stomach (nauseous), throwing up (vomiting), or both.  Feeling a lump in your belly that feels like it is beating (pulsating).  Feeling like you will pass out (faint).  How is this treated?  Medicine to control blood pressure and pain.  Imaging tests to see if the aneurysm gets bigger.  Surgery. How is this prevented? To lessen your chance of getting this  condition:  Stop smoking. Stop chewing tobacco.  Limit or avoid alcohol.  Keep your blood pressure, blood sugar, and cholesterol within normal limits.  Eat less salt.  Eat foods low in saturated fats and cholesterol. These are found in animal and whole dairy products.  Eat more fiber. Fiber is found in whole grains, vegetables, and fruits.  Keep a healthy weight.  Stay active and exercise often.  This information is not intended to replace advice given to you by your health care provider. Make sure you discuss any questions you have with your health care provider. Document Released: 08/17/2012 Document Revised: 09/28/2015 Document Reviewed: 05/22/2012 Elsevier Interactive Patient Education  2017 [employer]  

## 2017-05-30 ENCOUNTER — Emergency Department (HOSPITAL_COMMUNITY): Payer: Medicare Other

## 2017-05-30 ENCOUNTER — Encounter (HOSPITAL_COMMUNITY): Payer: Self-pay

## 2017-05-30 ENCOUNTER — Inpatient Hospital Stay (HOSPITAL_COMMUNITY)
Admission: EM | Admit: 2017-05-30 | Discharge: 2017-06-03 | DRG: 689 | Disposition: A | Discharge status: Home Health | Payer: Medicare Other | Attending: Internal Medicine | Admitting: Internal Medicine

## 2017-05-30 DIAGNOSIS — R269 Unspecified abnormalities of gait and mobility: Secondary | ICD-10-CM | POA: Diagnosis present

## 2017-05-30 DIAGNOSIS — N390 Urinary tract infection, site not specified: Secondary | ICD-10-CM | POA: Diagnosis not present

## 2017-05-30 DIAGNOSIS — I160 Hypertensive urgency: Secondary | ICD-10-CM | POA: Diagnosis present

## 2017-05-30 DIAGNOSIS — Z880 Allergy status to penicillin: Secondary | ICD-10-CM

## 2017-05-30 DIAGNOSIS — E875 Hyperkalemia: Secondary | ICD-10-CM | POA: Diagnosis present

## 2017-05-30 DIAGNOSIS — F329 Major depressive disorder, single episode, unspecified: Secondary | ICD-10-CM | POA: Diagnosis present

## 2017-05-30 DIAGNOSIS — I10 Essential (primary) hypertension: Secondary | ICD-10-CM | POA: Diagnosis present

## 2017-05-30 DIAGNOSIS — I714 Abdominal aortic aneurysm, without rupture: Secondary | ICD-10-CM | POA: Diagnosis present

## 2017-05-30 DIAGNOSIS — M545 Low back pain: Secondary | ICD-10-CM | POA: Diagnosis present

## 2017-05-30 DIAGNOSIS — L8990 Pressure ulcer of unspecified site, unspecified stage: Secondary | ICD-10-CM

## 2017-05-30 DIAGNOSIS — B9689 Other specified bacterial agents as the cause of diseases classified elsewhere: Secondary | ICD-10-CM | POA: Diagnosis present

## 2017-05-30 DIAGNOSIS — Z9181 History of falling: Secondary | ICD-10-CM

## 2017-05-30 DIAGNOSIS — N3000 Acute cystitis without hematuria: Secondary | ICD-10-CM | POA: Diagnosis not present

## 2017-05-30 DIAGNOSIS — Z79899 Other long term (current) drug therapy: Secondary | ICD-10-CM

## 2017-05-30 DIAGNOSIS — F32A Depression, unspecified: Secondary | ICD-10-CM | POA: Diagnosis present

## 2017-05-30 DIAGNOSIS — R110 Nausea: Secondary | ICD-10-CM | POA: Diagnosis present

## 2017-05-30 DIAGNOSIS — Z7982 Long term (current) use of aspirin: Secondary | ICD-10-CM

## 2017-05-30 DIAGNOSIS — G9341 Metabolic encephalopathy: Secondary | ICD-10-CM | POA: Diagnosis not present

## 2017-05-30 DIAGNOSIS — R52 Pain, unspecified: Secondary | ICD-10-CM

## 2017-05-30 DIAGNOSIS — I2510 Atherosclerotic heart disease of native coronary artery without angina pectoris: Secondary | ICD-10-CM | POA: Diagnosis present

## 2017-05-30 DIAGNOSIS — K219 Gastro-esophageal reflux disease without esophagitis: Secondary | ICD-10-CM | POA: Insufficient documentation

## 2017-05-30 DIAGNOSIS — M810 Age-related osteoporosis without current pathological fracture: Secondary | ICD-10-CM | POA: Diagnosis present

## 2017-05-30 DIAGNOSIS — Z87891 Personal history of nicotine dependence: Secondary | ICD-10-CM

## 2017-05-30 HISTORY — DX: Essential (primary) hypertension: I10

## 2017-05-30 LAB — DIFFERENTIAL
Basophils Absolute: 0 10*3/uL (ref 0.0–0.1)
Basophils Relative: 0 %
Eosinophils Absolute: 0.1 10*3/uL (ref 0.0–0.7)
Eosinophils Relative: 1 %
Lymphocytes Relative: 26 %
Lymphs Abs: 2.6 10*3/uL (ref 0.7–4.0)
Monocytes Absolute: 0.8 10*3/uL (ref 0.1–1.0)
Monocytes Relative: 8 %
Neutro Abs: 6.4 10*3/uL (ref 1.7–7.7)
Neutrophils Relative %: 65 %

## 2017-05-30 LAB — COMPREHENSIVE METABOLIC PANEL
ALT: 9 U/L — ABNORMAL LOW (ref 14–54)
AST: 18 U/L (ref 15–41)
Albumin: 4 g/dL (ref 3.5–5.0)
Alkaline Phosphatase: 75 U/L (ref 38–126)
Anion gap: 13 (ref 5–15)
BUN: 16 mg/dL (ref 6–20)
CO2: 21 mmol/L — ABNORMAL LOW (ref 22–32)
Calcium: 9.6 mg/dL (ref 8.9–10.3)
Chloride: 102 mmol/L (ref 101–111)
Creatinine, Ser: 0.81 mg/dL (ref 0.44–1.00)
GFR calc Af Amer: >60 mL/min (ref >60)
GFR calc non Af Amer: >60 mL/min (ref >60)
Glucose, Bld: 133 mg/dL — ABNORMAL HIGH (ref 65–99)
Potassium: 4 mmol/L (ref 3.5–5.1)
Sodium: 136 mmol/L (ref 135–145)
Total Bilirubin: 0.7 mg/dL (ref 0.3–1.2)
Total Protein: 7.5 g/dL (ref 6.5–8.1)

## 2017-05-30 LAB — URINALYSIS, ROUTINE W REFLEX MICROSCOPIC
Bilirubin Urine: NEGATIVE
Glucose, UA: NEGATIVE mg/dL
Hgb urine dipstick: NEGATIVE
Ketones, ur: NEGATIVE mg/dL
Nitrite: NEGATIVE
Protein, ur: NEGATIVE mg/dL
Specific Gravity, Urine: 1.009 (ref 1.005–1.030)
pH: 6 (ref 5.0–8.0)

## 2017-05-30 LAB — CBC
HCT: 48.7 % — ABNORMAL HIGH (ref 36.0–46.0)
Hemoglobin: 15.9 g/dL — ABNORMAL HIGH (ref 12.0–15.0)
MCH: 30.8 pg (ref 26.0–34.0)
MCHC: 32.6 g/dL (ref 30.0–36.0)
MCV: 94.2 fL (ref 78.0–100.0)
Platelets: 281 10*3/uL (ref 150–400)
RBC: 5.17 MIL/uL — ABNORMAL HIGH (ref 3.87–5.11)
RDW: 13.4 % (ref 11.5–15.5)
WBC: 9.9 10*3/uL (ref 4.0–10.5)

## 2017-05-30 LAB — PROTIME-INR
INR: 0.94
Prothrombin Time: 12.5 seconds (ref 11.4–15.2)

## 2017-05-30 LAB — I-STAT CHEM 8, ED
BUN: 18 mg/dL (ref 6–20)
Calcium, Ion: 1.2 mmol/L (ref 1.15–1.40)
Chloride: 101 mmol/L (ref 101–111)
Creatinine, Ser: 0.8 mg/dL (ref 0.44–1.00)
Glucose, Bld: 135 mg/dL — ABNORMAL HIGH (ref 65–99)
HCT: 49 % — ABNORMAL HIGH (ref 36.0–46.0)
Hemoglobin: 16.7 g/dL — ABNORMAL HIGH (ref 12.0–15.0)
Potassium: 4 mmol/L (ref 3.5–5.1)
Sodium: 139 mmol/L (ref 135–145)
TCO2: 26 mmol/L (ref 22–32)

## 2017-05-30 LAB — APTT: aPTT: 31 seconds (ref 24–36)

## 2017-05-30 LAB — I-STAT TROPONIN, ED: Troponin i, poc: 0.01 ng/mL (ref 0.00–0.08)

## 2017-05-30 MED ORDER — LABETALOL HCL 5 MG/ML IV SOLN
20.0000 mg | Freq: Once | INTRAVENOUS | Status: AC
  Administered 2017-05-30: 20 mg via INTRAVENOUS
  Filled 2017-05-30: qty 4

## 2017-05-30 MED ORDER — POLYVINYL ALCOHOL 1.4 % OP SOLN: 1.0000 [drp] | Freq: Four times a day (QID) | OPHTHALMIC | Status: DC | PRN

## 2017-05-30 MED ORDER — ACETAMINOPHEN 650 MG RE SUPP: 650.0000 mg | Freq: Four times a day (QID) | RECTAL | Status: DC | PRN

## 2017-05-30 MED ORDER — METOPROLOL SUCCINATE ER 25 MG PO TB24
25.0000 mg | ORAL_TABLET | Freq: Two times a day (BID) | ORAL | Status: DC
  Administered 2017-06-01 – 2017-06-03 (×5): 25 mg via ORAL
  Filled 2017-05-30 (×7): qty 1

## 2017-05-30 MED ORDER — ASPIRIN EC 81 MG PO TBEC
81.0000 mg | DELAYED_RELEASE_TABLET | Freq: Every day | ORAL | Status: DC
  Administered 2017-06-01 – 2017-06-03 (×3): 81 mg via ORAL
  Filled 2017-05-30 (×4): qty 1

## 2017-05-30 MED ORDER — VITAMIN D 1000 UNITS PO TABS
1000.0000 [IU] | ORAL_TABLET | Freq: Every day | ORAL | Status: DC
  Administered 2017-06-01 – 2017-06-03 (×3): 1000 [IU] via ORAL
  Filled 2017-05-30 (×4): qty 1

## 2017-05-30 MED ORDER — CITALOPRAM HYDROBROMIDE 10 MG PO TABS
10.0000 mg | ORAL_TABLET | Freq: Two times a day (BID) | ORAL | Status: DC
  Administered 2017-06-01 – 2017-06-03 (×5): 10 mg via ORAL
  Filled 2017-05-30 (×6): qty 1

## 2017-05-30 MED ORDER — ACETAMINOPHEN 325 MG PO TABS
650.0000 mg | ORAL_TABLET | Freq: Four times a day (QID) | ORAL | Status: DC | PRN
  Administered 2017-06-01 – 2017-06-03 (×4): 650 mg via ORAL
  Filled 2017-05-30 (×4): qty 2

## 2017-05-30 MED ORDER — LISINOPRIL 20 MG PO TABS
40.0000 mg | ORAL_TABLET | Freq: Every day | ORAL | Status: DC
  Filled 2017-05-30: qty 2

## 2017-05-30 MED ORDER — ONDANSETRON HCL 4 MG/2ML IJ SOLN: 4.0000 mg | Freq: Four times a day (QID) | INTRAMUSCULAR | Status: DC | PRN

## 2017-05-30 MED ORDER — POLYETHYLENE GLYCOL 3350 17 G PO PACK
17.0000 g | PACK | Freq: Every day | ORAL | Status: DC | PRN
  Administered 2017-06-02: 17 g via ORAL
  Filled 2017-05-30: qty 1

## 2017-05-30 MED ORDER — ZOLPIDEM TARTRATE 5 MG PO TABS: 5.0000 mg | ORAL_TABLET | Freq: Every evening | ORAL | Status: DC | PRN

## 2017-05-30 MED ORDER — TRAMADOL HCL 50 MG PO TABS
50.0000 mg | ORAL_TABLET | Freq: Four times a day (QID) | ORAL | Status: DC | PRN
  Administered 2017-06-02 – 2017-06-03 (×2): 50 mg via ORAL
  Filled 2017-05-30 (×2): qty 1

## 2017-05-30 MED ORDER — CIPROFLOXACIN IN D5W 400 MG/200ML IV SOLN: 400.0000 mg | Freq: Once | INTRAVENOUS | Status: DC

## 2017-05-30 MED ORDER — SODIUM CHLORIDE 0.9 % IV BOLUS (SEPSIS)
1000.0000 mL | Freq: Once | INTRAVENOUS | Status: AC
  Administered 2017-05-30: 1000 mL via INTRAVENOUS

## 2017-05-30 MED ORDER — DEXTROSE 5 % IV SOLN
1.0000 g | Freq: Three times a day (TID) | INTRAVENOUS | Status: DC
  Administered 2017-05-31 – 2017-06-02 (×8): 1 g via INTRAVENOUS
  Filled 2017-05-30 (×10): qty 1

## 2017-05-30 MED ORDER — HYDRALAZINE HCL 20 MG/ML IJ SOLN
5.0000 mg | INTRAMUSCULAR | Status: DC | PRN
  Administered 2017-05-31 – 2017-06-02 (×2): 5 mg via INTRAVENOUS
  Filled 2017-05-30 (×2): qty 1

## 2017-05-30 MED ORDER — CAPSAICIN 0.025 % EX CREA
1.0000 "application " | TOPICAL_CREAM | Freq: Every day | CUTANEOUS | Status: DC
  Administered 2017-06-01 – 2017-06-03 (×3): 1 via TOPICAL
  Filled 2017-05-30: qty 60

## 2017-05-30 MED ORDER — CYANOCOBALAMIN 500 MCG PO TABS
500.0000 ug | ORAL_TABLET | Freq: Every day | ORAL | Status: DC
  Administered 2017-06-01 – 2017-06-03 (×3): 500 ug via ORAL
  Filled 2017-05-30 (×3): qty 1

## 2017-05-30 MED ORDER — ONDANSETRON HCL 4 MG PO TABS: 4.0000 mg | ORAL_TABLET | Freq: Four times a day (QID) | ORAL | Status: DC | PRN

## 2017-05-30 MED ORDER — ENOXAPARIN SODIUM 40 MG/0.4ML ~~LOC~~ SOLN
40.0000 mg | SUBCUTANEOUS | Status: DC
  Administered 2017-05-31 – 2017-06-03 (×4): 40 mg via SUBCUTANEOUS
  Filled 2017-05-30 (×4): qty 0.4

## 2017-05-30 MED ORDER — FUROSEMIDE 20 MG PO TABS
20.0000 mg | ORAL_TABLET | ORAL | Status: DC
  Administered 2017-06-02: 20 mg via ORAL
  Filled 2017-05-30: qty 1

## 2017-05-30 NOTE — ED Notes (Signed)
EDP aware of pt BP 

## 2017-05-30 NOTE — ED Notes (Signed)
EDP notified pt BP still elevated

## 2017-05-30 NOTE — ED Notes (Signed)
Notified Dr. Juleen ChinaKohut about pt's pressure at this time.

## 2017-05-30 NOTE — ED Notes (Signed)
Pt states "is someone singing? Sounds like a man singing." No singing happening at this time

## 2017-05-30 NOTE — H&P (Signed)
History and Physical    Krystal SavoyBetty Patrick ZOX:[id number] DOB: 23-Sep-1920 DOA: 05/30/2017  Referring MD/NP/PA:   PCP: Kaleen MaskElkins, Wilson Oliver, MD   Patient coming from:  The patient is coming from home.  At baseline, pt is partially dependent for most of ADL. \\  Chief Complaint: Altered mental state, hallucination  HPI: Krystal SavoyBetty Patrick is a 84 y.o. female with medical history significant of hypertension, GERD, depression, CAD, AAA, who presents with altered mental status, hallucination.  Patient has AMS and is unable to provide accurate medical history, therefore, most of the history is obtained by discussing the case with ED physician, per EMS report, and with the nursing staff.  Per report, pt was confused in this AM. Per Triage notes," amily noticed pt having trouble using fork this morning, having new onset gait disturbance, and auditory and visual hallucinations". Pt reports she couldn't figure out why the "firemen were wearing lipstick and cowboy hats." LSN was 8 PM last night per EMS. When I saw pt in ED, she is confused. She knows her own name. She denies any pain anywhere. Patient does not have facial droop, slurred speech. Patient moves all extremities normally. No active cough, respiratory distress, nausea, vomiting, diarrhea noted. Not sure if patient has any symptoms of UTI. Per EMS, pt had elevated blood pressure 230/100, which improved to 140/80 after giving 20 mg of labetalol IV in ED.   ED Course: pt was found to have positive urinalysis with moderate amount of leukocytes, WBC 9.9, electrolytes renal function okay, temperature normal, bradycardia, O2 sat 90-93% on room air. CT head is negative for acute intracranial abnormalities.  Review of Systems: Could not be reviewed accurately due to altered mental status.   Allergy:  Allergies  Allergen Reactions  . Penicillins Rash    Past Medical History:  Diagnosis Date  . AAA (abdominal aortic aneurysm) (HCC)   . CAD  (coronary artery disease)   . GERD (gastroesophageal reflux disease)   . Hypertension     Past Surgical History:  Procedure Laterality Date  . CHOLECYSTECTOMY    . SMALL BOWEL REPAIR      Social History:  reports that she quit smoking about 27 years ago. Her smoking use included cigarettes. she has never used smokeless tobacco. She reports that she does not drink alcohol or use drugs.  Family History:  Family History  Problem Relation Age of Onset  . Heart disease Father   . Heart attack Father   . Diabetes Daughter   . Hypertension Daughter      Prior to Admission medications   Medication Sig Start Date End Date Taking? Authorizing Provider  aspirin EC 81 MG tablet Take 81 mg by mouth daily.   Yes [provider]  capsaicin (ARTHRITIS PAIN RELIEF) 0.025 % cream Apply 1 application topically daily.   Yes [provider]  carboxymethylcellul-glycerin ([employer] LUBRICATING/DRY EYE) 0.5-0.9 % ophthalmic solution Place 1 drop into both eyes as needed for dry eyes.   Yes [provider]  Cholecalciferol (VITAMIN D3) 10000 units TABS Take 1,000 Units by mouth daily.   Yes [provider]  citalopram (CELEXA) 10 MG tablet Take 10 mg by mouth 2 (two) times daily. 1 or 2 tablets daily for depression    Yes [provider]  furosemide (LASIX) 20 MG tablet Take 20 mg by mouth 3 (three) times a week.    Yes [provider]  lisinopril (PRINIVIL,ZESTRIL) 40 MG tablet Take 40 mg by mouth daily.  Yes [provider]  metoprolol succinate (TOPROL-XL) 25 MG 24 hr tablet Take 25 mg by mouth 2 (two) times daily.    Yes [provider]  traMADol (ULTRAM) 50 MG tablet Take 50 mg by mouth every 6 (six) hours as needed.   Yes [provider]  vitamin B-12 (CYANOCOBALAMIN) 500 MCG tablet Take 500 mcg by mouth daily.   Yes [provider]    Physical Exam: Vitals:   05/30/17 2220 05/30/17 2240 05/30/17 2304 05/30/17  2309  BP:   (!) 175/109   Pulse: 67 65 69 65  Resp: 18 16 19  (!) 27  Temp:      TempSrc:      SpO2: 94% 93% 92% 95%  Weight:      Height:       General: Not in acute distress HEENT:       Eyes: PERRL, EOMI, no scleral icterus.       ENT: No discharge from the ears and nose, no pharynx injection, no tonsillar enlargement.        Neck: No JVD, no bruit, no mass felt. Heme: No neck lymph node enlargement. Cardiac: S1/S2, RRR, No murmurs, No gallops or rubs. Respiratory: No rales, wheezing, rhonchi or rubs. GI: Soft, nondistended, nontender, no organomegaly, BS present. GU: No hematuria Ext: No pitting leg edema bilaterally. 2+DP/PT pulse bilaterally. Musculoskeletal: No joint deformities, No joint redness or warmth, no limitation of ROM in spin. Skin: No rashes.  Neuro: confused, knows her own name, not oriented X3, cranial nerves II-XII grossly intact, moves all extremities normally. Psych: Patient is not psychotic, no suicidal or hemocidal ideation.  Labs on Admission: I have personally reviewed following labs and imaging studies  CBC: Recent Labs  Lab 05/30/17 1614 05/30/17 1629  WBC 9.9  --   NEUTROABS 6.4  --   HGB 15.9* 16.7*  HCT 48.7* 49.0*  MCV 94.2  --   PLT 281  --    Basic Metabolic Panel: Recent Labs  Lab 05/30/17 1614 05/30/17 1629  NA 136 139  K 4.0 4.0  CL 102 101  CO2 21*  --   GLUCOSE 133* 135*  BUN 16 18  CREATININE 0.81 0.80  CALCIUM 9.6  --    GFR: Estimated Creatinine Clearance: 35.6 mL/min (by C-G formula based on SCr of 0.8 mg/dL). Liver Function Tests: Recent Labs  Lab 05/30/17 1614  AST 18  ALT 9*  ALKPHOS 75  BILITOT 0.7  PROT 7.5  ALBUMIN 4.0   No results for input(s): LIPASE, AMYLASE in the last 168 hours. No results for input(s): AMMONIA in the last 168 hours. Coagulation Profile: Recent Labs  Lab 05/30/17 1614  INR 0.94   Cardiac Enzymes: No results for input(s): CKTOTAL, CKMB, CKMBINDEX, TROPONINI in the last 168  hours. BNP (last 3 results) No results for input(s): PROBNP in the last 8760 hours. HbA1C: No results for input(s): HGBA1C in the last 72 hours. CBG: No results for input(s): GLUCAP in the last 168 hours. Lipid Profile: No results for input(s): CHOL, HDL, LDLCALC, TRIG, CHOLHDL, LDLDIRECT in the last 72 hours. Thyroid Function Tests: No results for input(s): TSH, T4TOTAL, FREET4, T3FREE, THYROIDAB in the last 72 hours. Anemia Panel: No results for input(s): VITAMINB12, FOLATE, FERRITIN, TIBC, IRON, RETICCTPCT in the last 72 hours. Urine analysis:    Component Value Date/Time   COLORURINE YELLOW 05/30/2017 1748   APPEARANCEUR CLEAR 05/30/2017 1748   LABSPEC 1.009 05/30/2017 1748   PHURINE 6.0 05/30/2017  1748   GLUCOSEU NEGATIVE 05/30/2017 1748   HGBUR NEGATIVE 05/30/2017 1748   BILIRUBINUR NEGATIVE 05/30/2017 1748   KETONESUR NEGATIVE 05/30/2017 1748   PROTEINUR NEGATIVE 05/30/2017 1748   NITRITE NEGATIVE 05/30/2017 1748   LEUKOCYTESUR MODERATE (A) 05/30/2017 1748   Sepsis Labs: @LABRCNTIP (procalcitonin:4,lacticidven:4) )No results found for this or any previous visit (from the past 240 hour(s)).   Radiological Exams on Admission: Ct Head Wo Contrast  Result Date: 05/30/2017 CLINICAL DATA:  Hypertension with neurologic deficits. EXAM: CT HEAD WITHOUT CONTRAST TECHNIQUE: Contiguous axial images were obtained from the base of the skull through the vertex without intravenous contrast. COMPARISON:  MRI of the brain 02/12/2015 FINDINGS: Brain: No evidence of acute infarction, hemorrhage, hydrocephalus, extra-axial collection or mass lesion/mass effect. Moderate brain parenchymal volume loss and deep white matter microangiopathy. Vascular: Calcific atherosclerotic disease of the intra cavernous internal carotid arteries. Skull: Normal. Negative for fracture or focal lesion. Sinuses/Orbits: Right maxillary and ethmoid sinusitis. Other: None. IMPRESSION: No acute intracranial abnormality.  Atrophy, chronic microvascular disease. Electronically Signed   By: Ted Mcalpine M.D.   On: 05/30/2017 17:01     EKG: Independently reviewed. Sinus rhythm, QTC 465, LAD, biphasic T-wave in V2 to V3  Assessment/Plan Principal Problem:   UTI (urinary tract infection) Active Problems:   Hypertension   CAD (coronary artery disease)   Acute metabolic encephalopathy   Hypertensive urgency   Depression   UTI (urinary tract infection): Urinalysis is positive with moderate amount of leukocyte.  -  Place on med-surg bed for obs -  Aztreo but givennam by IV - Follow up results of urine and blood cx and amend antibiotic regimen if needed per sensitivity results - prn Zofran for nausea  Acute metabolic encephalopathy and hallucination: CT head is negative for acute intracranial abnormalities. Most likely due to UTI. Potential differential diagnosiss is TIA/stroke, but patient moves all extremities normally. No focal neurological findings on physical examination. Low suspicion for stroke. Will treat patient as UTI now, if no improvement, will consider to get MRI of her brain. -Antibiotics as above for UTI -Frequent neuro checks  Hypertensive urgency:  pt had elevated blood pressure 230/100, which improved to 140/80 after giving 20 mg of labetalol IV in ED.  -will continue home metoprolol, Lasix, lisinopril -IV hydralazine when necessary  Hx of CAD (coronary artery disease): No CP -continue aspirin and Lipitor  Depression: -Continue Celexa  DVT ppx: SQ Lovenox Code Status: Full code (cannot be discussed due to altered mental status, will temporarily put full code. This will need to be readdressed in the morning with family). Family Communication: None at bed side. Disposition Plan:  Anticipate discharge back to previous home environment Consults called:  none Admission status: Obs / tele    Date of Service 05/30/2017    Lorretta Harp [facility] Pager [phone]  If  7PM-7AM, please contact night-coverage [email or web address] Password [facility] 05/30/2017, 11:18 PM

## 2017-05-30 NOTE — ED Notes (Signed)
Upon rounding, pt not in room. Pt in CT at this time.

## 2017-05-30 NOTE — ED Notes (Signed)
Pt reports she took her BP medication this AM

## 2017-05-30 NOTE — ED Triage Notes (Signed)
Pt from home via EMS for HTN and neuro deficits. LSN 8 PM last night. Per EMS, family noticed pt having trouble using fork this morning, having new onset gait disturbance, and auditory and visual hallucinations. Pt reports she couldn't figure out why the "firemen were wearing lipstick and cowboy hats." Pt A&Ox4 despite hallucinations. EMS VS: 230/100, 64 HR, 16 RR, 98% on RA, CBG 135.

## 2017-05-30 NOTE — ED Notes (Signed)
Pt reports she is still hearing music

## 2017-05-31 DIAGNOSIS — I10 Essential (primary) hypertension: Secondary | ICD-10-CM | POA: Diagnosis not present

## 2017-05-31 DIAGNOSIS — E119 Type 2 diabetes mellitus without complications: Secondary | ICD-10-CM | POA: Diagnosis not present

## 2017-05-31 DIAGNOSIS — N3000 Acute cystitis without hematuria: Secondary | ICD-10-CM | POA: Diagnosis not present

## 2017-05-31 DIAGNOSIS — I2510 Atherosclerotic heart disease of native coronary artery without angina pectoris: Secondary | ICD-10-CM

## 2017-05-31 DIAGNOSIS — R262 Difficulty in walking, not elsewhere classified: Secondary | ICD-10-CM

## 2017-05-31 DIAGNOSIS — I160 Hypertensive urgency: Secondary | ICD-10-CM

## 2017-05-31 DIAGNOSIS — K219 Gastro-esophageal reflux disease without esophagitis: Secondary | ICD-10-CM | POA: Diagnosis not present

## 2017-05-31 DIAGNOSIS — N390 Urinary tract infection, site not specified: Secondary | ICD-10-CM | POA: Diagnosis not present

## 2017-05-31 DIAGNOSIS — L8990 Pressure ulcer of unspecified site, unspecified stage: Secondary | ICD-10-CM

## 2017-05-31 DIAGNOSIS — G9341 Metabolic encephalopathy: Secondary | ICD-10-CM | POA: Diagnosis not present

## 2017-05-31 DIAGNOSIS — E669 Obesity, unspecified: Secondary | ICD-10-CM

## 2017-05-31 LAB — BASIC METABOLIC PANEL
Anion gap: 11 (ref 5–15)
BUN: 12 mg/dL (ref 6–20)
CHLORIDE: 105 mmol/L (ref 101–111)
CO2: 20 mmol/L — ABNORMAL LOW (ref 22–32)
Calcium: 8.7 mg/dL — ABNORMAL LOW (ref 8.9–10.3)
Creatinine, Ser: 0.72 mg/dL (ref 0.44–1.00)
GFR calc Af Amer: >60 mL/min (ref >60)
GFR calc non Af Amer: >60 mL/min (ref >60)
GLUCOSE: 123 mg/dL — AB (ref 65–99)
POTASSIUM: 5.9 mmol/L — AB (ref 3.5–5.1)
Sodium: 136 mmol/L (ref 135–145)

## 2017-05-31 LAB — CBC
HEMATOCRIT: 45 % (ref 36.0–46.0)
Hemoglobin: 15.2 g/dL — ABNORMAL HIGH (ref 12.0–15.0)
MCH: 31.5 pg (ref 26.0–34.0)
MCHC: 33.8 g/dL (ref 30.0–36.0)
MCV: 93.2 fL (ref 78.0–100.0)
Platelets: 280 10*3/uL (ref 150–400)
RBC: 4.83 MIL/uL (ref 3.87–5.11)
RDW: 13.5 % (ref 11.5–15.5)
WBC: 10.2 10*3/uL (ref 4.0–10.5)

## 2017-05-31 LAB — CBG MONITORING, ED: Glucose-Capillary: 137 mg/dL — ABNORMAL HIGH (ref 65–99)

## 2017-05-31 MED ORDER — SODIUM POLYSTYRENE SULFONATE 15 GM/60ML PO SUSP
15.0000 g | Freq: Once | ORAL | Status: DC
  Filled 2017-05-31 (×3): qty 60

## 2017-05-31 NOTE — Progress Notes (Signed)
Patient refuses to wear cardiac monitor as ordered.  Becomes more and more agitated.  Have elected not to place it on her until her mentation clears.  Daughter in attendance and has spoken with Dr. Margo AyeHall.  She states questions were answered to her satisfaction.

## 2017-05-31 NOTE — Progress Notes (Signed)
Paged NP Blount regarding Kexalate enema. Will await response.

## 2017-05-31 NOTE — Progress Notes (Signed)
PROGRESS NOTE  Krystal Patrick ZOX:[id number] DOB: 1920-09-24 DOA: 05/30/2017 PCP: Kaleen Mask, MD  HPI/Recap of past 24 hours: Krystal Patrick is a 84 y.o. female with medical history significant of hypertension, GERD, depression, CAD, AAA, who presents with altered mental status, auditory and visual hallucinations. Admitted for acute metabolic encephalopathy 2/2 to UTI, poa.  05/31/17: seen with daughter at her bedside. Still very confused. Unable to obtain ROS.   Assessment/Plan: Principal Problem:   UTI (urinary tract infection) Active Problems:   Hypertension   CAD (coronary artery disease)   Acute metabolic encephalopathy   Hypertensive urgency   Depression  UTI (urinary tract infection), poa:  -  U/A positive - urine cx, blood cx in process - IV aztreonam -allergy to pCN  Acute metabolic encephalopathy/auditory and visual hallucination:  -CT head is negative for acute intracranial abnormalities.  -Most likely due to UTI.  -Will treat patient as UTI now, if no improvement, will consider to get MRI of her brain. -Antibiotics as above for UTI -Frequent neuro checks -keep NPO to prevent aspiration in the setting of acute encephalopathy  Hypertensive urgency:  -elevated blood pressure 230/100, which improved to 140/80 after giving 20 mg of labetalol IV in ED.  -will continue home metoprolol, Lasix, lisinopril -IV hydralazine prn  Hyperkalemia -K+ 5.9 -EKG no peaked Twaves -kayexalate rectally -BMP am  Hx of CAD (coronary artery disease): -continue aspirin and Lipitor  Depression: -Continue Celexa   Code Status: full   Family Communication: daughter at bedside. All questions answered to her satisfaction.  Disposition Plan: will stay another midnight to continue IV antibiotics.    Consultants:  none  Procedures:  none  Antimicrobials:  azetreonam  DVT prophylaxis:  sq lovenox  Objective: Vitals:   05/30/17 2304 05/30/17  2309 05/31/17 0603 05/31/17 0700  BP: (!) 175/109  (!) 185/75 (!) 176/80  Pulse: 69 65    Resp: 19 (!) 27 19 19   Temp:      TempSrc:      SpO2: 92% 95%    Weight:      Height:        Intake/Output Summary (Last 24 hours) at 05/31/2017 0917 Last data filed at 05/30/2017 2141 Gross per 24 hour  Intake 999 ml  Output -  Net 999 ml   Filed Weights   05/30/17 1607  Weight: 59.9 kg (132 lb)    Exam:   General:  84 yo CF WD WN confused   Cardiovascular: RRR no rubs or gallops   Respiratory: CTA no wheezes or rales   Abdomen: soft NT ND NBS x4  Musculoskeletal: non focal  Skin: no rash  Psychiatry: unable to assess due to confusion    Data Reviewed: CBC: Recent Labs  Lab 05/30/17 1614 05/30/17 1629 05/31/17 0336  WBC 9.9  --  10.2  NEUTROABS 6.4  --   --   HGB 15.9* 16.7* 15.2*  HCT 48.7* 49.0* 45.0  MCV 94.2  --  93.2  PLT 281  --  280   Basic Metabolic Panel: Recent Labs  Lab 05/30/17 1614 05/30/17 1629 05/31/17 0336  NA 136 139 136  K 4.0 4.0 5.9*  CL 102 101 105  CO2 21*  --  20*  GLUCOSE 133* 135* 123*  BUN 16 18 12   CREATININE 0.81 0.80 0.72  CALCIUM 9.6  --  8.7*   GFR: Estimated Creatinine Clearance: 35.6 mL/min (by C-G formula based on SCr of 0.72 mg/dL). Liver Function Tests: Recent Labs  Lab 05/30/17 1614  AST 18  ALT 9*  ALKPHOS 75  BILITOT 0.7  PROT 7.5  ALBUMIN 4.0   No results for input(s): LIPASE, AMYLASE in the last 168 hours. No results for input(s): AMMONIA in the last 168 hours. Coagulation Profile: Recent Labs  Lab 05/30/17 1614  INR 0.94   Cardiac Enzymes: No results for input(s): CKTOTAL, CKMB, CKMBINDEX, TROPONINI in the last 168 hours. BNP (last 3 results) No results for input(s): PROBNP in the last 8760 hours. HbA1C: No results for input(s): HGBA1C in the last 72 hours. CBG: No results for input(s): GLUCAP in the last 168 hours. Lipid Profile: No results for input(s): CHOL, HDL, LDLCALC, TRIG, CHOLHDL,  LDLDIRECT in the last 72 hours. Thyroid Function Tests: No results for input(s): TSH, T4TOTAL, FREET4, T3FREE, THYROIDAB in the last 72 hours. Anemia Panel: No results for input(s): VITAMINB12, FOLATE, FERRITIN, TIBC, IRON, RETICCTPCT in the last 72 hours. Urine analysis:    Component Value Date/Time   COLORURINE YELLOW 05/30/2017 1748   APPEARANCEUR CLEAR 05/30/2017 1748   LABSPEC 1.009 05/30/2017 1748   PHURINE 6.0 05/30/2017 1748   GLUCOSEU NEGATIVE 05/30/2017 1748   HGBUR NEGATIVE 05/30/2017 1748   BILIRUBINUR NEGATIVE 05/30/2017 1748   KETONESUR NEGATIVE 05/30/2017 1748   PROTEINUR NEGATIVE 05/30/2017 1748   NITRITE NEGATIVE 05/30/2017 1748   LEUKOCYTESUR MODERATE (A) 05/30/2017 1748   Sepsis Labs: @LABRCNTIP (procalcitonin:4,lacticidven:4)  )No results found for this or any previous visit (from the past 240 hour(s)).    Studies: Ct Head Wo Contrast  Result Date: 05/30/2017 CLINICAL DATA:  Hypertension with neurologic deficits. EXAM: CT HEAD WITHOUT CONTRAST TECHNIQUE: Contiguous axial images were obtained from the base of the skull through the vertex without intravenous contrast. COMPARISON:  MRI of the brain 02/12/2015 FINDINGS: Brain: No evidence of acute infarction, hemorrhage, hydrocephalus, extra-axial collection or mass lesion/mass effect. Moderate brain parenchymal volume loss and deep white matter microangiopathy. Vascular: Calcific atherosclerotic disease of the intra cavernous internal carotid arteries. Skull: Normal. Negative for fracture or focal lesion. Sinuses/Orbits: Right maxillary and ethmoid sinusitis. Other: None. IMPRESSION: No acute intracranial abnormality. Atrophy, chronic microvascular disease. Electronically Signed   By: Ted Mcalpineobrinka  Dimitrova M.D.   On: 05/30/2017 17:01    Scheduled Meds: . aspirin EC  81 mg Oral Daily  . capsaicin  1 application Topical Daily  . cholecalciferol  1,000 Units Oral Daily  . citalopram  10 mg Oral BID  . enoxaparin  (LOVENOX) injection  40 mg Subcutaneous Q24H  . [START ON 06/02/2017] furosemide  20 mg Oral Once per day on Mon Wed Fri  . lisinopril  40 mg Oral Daily  . metoprolol succinate  25 mg Oral BID  . vitamin B-12  500 mcg Oral Daily    Continuous Infusions: . aztreonam Stopped (05/31/17 0518)     LOS: 0 days     Darlin Droparole N Hall, MD [facility] Pager [phone]  If 7PM-7AM, please contact night-coverage [email or web address] Password [email or web address] 05/31/2017, 9:17 AM

## 2017-05-31 NOTE — Progress Notes (Signed)
NP Blount returned page. She said that if patient is refusing and that it might cause agitation, the enema could wait till tomorrow. "It is not that critical" Patient is resting. Will continue to closely monitor patient.

## 2017-05-31 NOTE — Progress Notes (Addendum)
Patient increasingly restless / delusional / hallucinating.  Incontinent of urine / able to have small stool in bedpan.  Purewick in use with good effect.  Patient has very poor memory and thinks she is at her home.  Calls out for her daughter, who was here, but has since left.  MD aware of her delirium.  Is being treated with antibiotics.  Bed alarm on for safety and tele-monitor sitter will be placed in room.  Bed in low position.

## 2017-05-31 NOTE — Progress Notes (Signed)
Received report on patient, who has been restless, agitated and has delirium.  Spent much time with patient and established good repor. Patient does not see well, and cannot use call bell effectively, unless lying on her side and presses the one on the bed. She actually called with an appropriate request. She forgets easily, so she is constantly needing to be redirected and calmed down. Purewick removed from her, she does "not like it between my legs." she is incontinent. Cleaned up, dryed. Foot care done. Clean linen. Encouragement. When I explained she needed an enema, she said "oh, no. I don't want that" It has not come from pharmacy. Will speak to MD to see if this can be held till morning, as she probably sundowns at night and it could make it worse. Patient safety maintained with tele sitter and bed alarm on. Sitting close to room, also.

## 2017-05-31 NOTE — ED Notes (Signed)
Pt assisted off bedpan. Pt repositioned in bed

## 2017-05-31 NOTE — ED Notes (Signed)
Pt moved to Pod F for holding. Noted that IV to R AC infiltrated with normal saline, IV removed. Pt also c/o that sheets are wet. Purewick found on the floor. Pericare completed, linens changed, new purewick in place. Pt moved over to a hospital bed. Bed alarm set

## 2017-06-01 DIAGNOSIS — G9341 Metabolic encephalopathy: Secondary | ICD-10-CM | POA: Diagnosis present

## 2017-06-01 DIAGNOSIS — Z9181 History of falling: Secondary | ICD-10-CM | POA: Diagnosis not present

## 2017-06-01 DIAGNOSIS — M545 Low back pain: Secondary | ICD-10-CM | POA: Diagnosis present

## 2017-06-01 DIAGNOSIS — N3000 Acute cystitis without hematuria: Secondary | ICD-10-CM | POA: Diagnosis not present

## 2017-06-01 DIAGNOSIS — Z87891 Personal history of nicotine dependence: Secondary | ICD-10-CM | POA: Diagnosis not present

## 2017-06-01 DIAGNOSIS — M810 Age-related osteoporosis without current pathological fracture: Secondary | ICD-10-CM | POA: Diagnosis present

## 2017-06-01 DIAGNOSIS — Z880 Allergy status to penicillin: Secondary | ICD-10-CM | POA: Diagnosis not present

## 2017-06-01 DIAGNOSIS — F329 Major depressive disorder, single episode, unspecified: Secondary | ICD-10-CM | POA: Diagnosis present

## 2017-06-01 DIAGNOSIS — I714 Abdominal aortic aneurysm, without rupture: Secondary | ICD-10-CM | POA: Diagnosis present

## 2017-06-01 DIAGNOSIS — B9689 Other specified bacterial agents as the cause of diseases classified elsewhere: Secondary | ICD-10-CM | POA: Diagnosis present

## 2017-06-01 DIAGNOSIS — N390 Urinary tract infection, site not specified: Secondary | ICD-10-CM | POA: Diagnosis present

## 2017-06-01 DIAGNOSIS — I2510 Atherosclerotic heart disease of native coronary artery without angina pectoris: Secondary | ICD-10-CM | POA: Diagnosis present

## 2017-06-01 DIAGNOSIS — I10 Essential (primary) hypertension: Secondary | ICD-10-CM | POA: Diagnosis present

## 2017-06-01 DIAGNOSIS — L8990 Pressure ulcer of unspecified site, unspecified stage: Secondary | ICD-10-CM | POA: Diagnosis present

## 2017-06-01 DIAGNOSIS — K219 Gastro-esophageal reflux disease without esophagitis: Secondary | ICD-10-CM | POA: Diagnosis present

## 2017-06-01 DIAGNOSIS — Z7982 Long term (current) use of aspirin: Secondary | ICD-10-CM | POA: Diagnosis not present

## 2017-06-01 DIAGNOSIS — Z79899 Other long term (current) drug therapy: Secondary | ICD-10-CM | POA: Diagnosis not present

## 2017-06-01 DIAGNOSIS — R269 Unspecified abnormalities of gait and mobility: Secondary | ICD-10-CM | POA: Diagnosis present

## 2017-06-01 DIAGNOSIS — I160 Hypertensive urgency: Secondary | ICD-10-CM | POA: Diagnosis present

## 2017-06-01 DIAGNOSIS — R52 Pain, unspecified: Secondary | ICD-10-CM | POA: Diagnosis not present

## 2017-06-01 DIAGNOSIS — E875 Hyperkalemia: Secondary | ICD-10-CM | POA: Diagnosis present

## 2017-06-01 DIAGNOSIS — R110 Nausea: Secondary | ICD-10-CM | POA: Diagnosis present

## 2017-06-01 LAB — URINE CULTURE: Culture: 50000 — AB

## 2017-06-01 LAB — BASIC METABOLIC PANEL
Anion gap: 14 (ref 5–15)
BUN: 17 mg/dL (ref 6–20)
CHLORIDE: 102 mmol/L (ref 101–111)
CO2: 20 mmol/L — ABNORMAL LOW (ref 22–32)
CREATININE: 0.97 mg/dL (ref 0.44–1.00)
Calcium: 9.1 mg/dL (ref 8.9–10.3)
GFR calc Af Amer: 56 mL/min — ABNORMAL LOW (ref >60)
GFR, EST NON AFRICAN AMERICAN: 48 mL/min — AB (ref >60)
GLUCOSE: 132 mg/dL — AB (ref 65–99)
Potassium: 3.5 mmol/L (ref 3.5–5.1)
SODIUM: 136 mmol/L (ref 135–145)

## 2017-06-01 MED ORDER — KETOROLAC TROMETHAMINE 15 MG/ML IJ SOLN: 15.0000 mg | Freq: Once | INTRAMUSCULAR | Status: DC

## 2017-06-01 MED ORDER — SODIUM CHLORIDE 0.9% FLUSH
3.0000 mL | Freq: Two times a day (BID) | INTRAVENOUS | Status: DC
  Administered 2017-06-01 – 2017-06-03 (×4): 3 mL via INTRAVENOUS

## 2017-06-01 MED ORDER — LISINOPRIL 10 MG PO TABS
10.0000 mg | ORAL_TABLET | Freq: Every day | ORAL | Status: DC
  Administered 2017-06-01 – 2017-06-03 (×3): 10 mg via ORAL
  Filled 2017-06-01 (×3): qty 1

## 2017-06-01 MED ORDER — KETOROLAC TROMETHAMINE 15 MG/ML IJ SOLN
7.5000 mg | Freq: Once | INTRAMUSCULAR | Status: AC
  Administered 2017-06-01: 7.5 mg via INTRAVENOUS
  Filled 2017-06-01: qty 1

## 2017-06-01 MED ORDER — SODIUM CHLORIDE 0.9% FLUSH: 3.0000 mL | INTRAVENOUS | Status: DC | PRN

## 2017-06-01 NOTE — Progress Notes (Signed)
On initial assessment, patient found to be alert and oriented to self, person, place, and situation.  Daughter in attendance at bedside.  Patient states she remembers "clouds coming up from the earth" yesterday and that she was fearful.  Otherwise, she remembers little else.  Pleasant and talkative with nurse and daughter.  States she is hungry.  MAEW.  NIH scale = 1 at this time.

## 2017-06-01 NOTE — Progress Notes (Signed)
Upon assessment, patient is quite uncomfortable, tylenol last given approximately 6pm. Paged NP Blount. Awaiting call back.

## 2017-06-01 NOTE — ED Provider Notes (Signed)
[facility] PROGRESSIVE CARE Provider Note   CSN: [id number] Arrival date & time: 05/30/17  1559     History   Chief Complaint Chief Complaint  Patient presents with  . Hypertension  . Gait Problem    HPI Krystal Patrick is a 84 y.o. female.  HPI   Patient has AMS and is unable to provide accurate medical history, therefore, most of the history is obtained by discussing the case with ED physician, per EMS report, and with the nursing staff.  Per report, pt was confused in this AM. Per Triage notes," family noticed pt having trouble using fork this morning, having new onset gait disturbance, and auditory and visual hallucinations". Pt reports she couldn't figure out why the "firemen were wearing lipstick and cowboy hats." LSN was 8 PM last night per EMS. When I saw pt in ED, she is confused. She knows her own name. She denies any pain anywhere    Past Medical History:  Diagnosis Date  . AAA (abdominal aortic aneurysm) (HCC)   . CAD (coronary artery disease)   . GERD (gastroesophageal reflux disease)   . Hypertension     Patient Active Problem List   Diagnosis Date Noted  . Pressure injury of skin 05/31/2017  . UTI (urinary tract infection) 05/30/2017  . Acute metabolic encephalopathy 05/30/2017  . Hypertensive urgency 05/30/2017  . Depression 05/30/2017  . Hypertension   . CAD (coronary artery disease)   . GERD (gastroesophageal reflux disease)   . AAA (abdominal aortic aneurysm) without rupture (HCC) 09/06/2014    Past Surgical History:  Procedure Laterality Date  . CHOLECYSTECTOMY    . SMALL BOWEL REPAIR      OB History    No data available       Home Medications    Prior to Admission medications   Medication Sig Start Date End Date Taking? Authorizing Provider  aspirin EC 81 MG tablet Take 81 mg by mouth daily.   Yes [provider]  capsaicin (ARTHRITIS PAIN RELIEF) 0.025 % cream Apply 1 application topically daily.   Yes [provider]  carboxymethylcellul-glycerin ([employer] LUBRICATING/DRY EYE) 0.5-0.9 % ophthalmic solution Place 1 drop into both eyes as needed for dry eyes.   Yes [provider]  Cholecalciferol (VITAMIN D3) 10000 units TABS Take 1,000 Units by mouth daily.   Yes [provider]  citalopram (CELEXA) 10 MG tablet Take 10 mg by mouth 2 (two) times daily. 1 or 2 tablets daily for depression    Yes [provider]  furosemide (LASIX) 20 MG tablet Take 20 mg by mouth 3 (three) times a week.    Yes [provider]  lisinopril (PRINIVIL,ZESTRIL) 40 MG tablet Take 40 mg by mouth daily.   Yes [provider]  metoprolol succinate (TOPROL-XL) 25 MG 24 hr tablet Take 25 mg by mouth 2 (two) times daily.    Yes [provider]  traMADol (ULTRAM) 50 MG tablet Take 50 mg by mouth every 6 (six) hours as needed.   Yes [provider]  vitamin B-12 (CYANOCOBALAMIN) 500 MCG tablet Take 500 mcg by mouth daily.   Yes [provider]    Family History Family History  Problem Relation Age of Onset  . Heart disease Father   . Heart attack Father   . Diabetes Daughter   . Hypertension Daughter     Social History Social History   Tobacco Use  . Smoking status: Former Smoker    Types:  Cigarettes    Last attempt to quit: 09/05/1989    Years since quitting: 27.7  . Smokeless tobacco: Never Used  Substance Use Topics  . Alcohol use: No    Alcohol/week: 0.0 oz  . Drug use: No     Allergies   Penicillins   Review of Systems Review of Systems  All systems reviewed and negative, other than as noted in HPI.  Physical Exam Updated Vital Signs BP (!) 151/110 (BP Location: Left Arm) Comment: let rn know   Pulse 76   Temp 98.2 F (36.8 C) (Oral)   Resp 18   Ht 5\' 3"  (1.6 m)   Wt 59.9 kg (132 lb)   SpO2 96%   BMI 23.38 kg/m   Physical Exam  Constitutional: She appears well-developed and well-nourished. No distress.  HENT:  Head:  Normocephalic and atraumatic.  Eyes: Conjunctivae are normal. Right eye exhibits no discharge. Left eye exhibits no discharge.  Neck: Neck supple.  Cardiovascular: Normal rate, regular rhythm and normal heart sounds. Exam reveals no gallop and no friction rub.  No murmur heard. Pulmonary/Chest: Effort normal and breath sounds normal. No respiratory distress.  Abdominal: Soft. She exhibits no distension. There is no tenderness.  Musculoskeletal: She exhibits no edema or tenderness.  Neurological: She is alert. No cranial nerve deficit. She exhibits normal muscle tone. Coordination normal.  Disoriented to time  Skin: Skin is warm and dry.  Psychiatric: Her behavior is normal. Thought content normal.  Nursing note and vitals reviewed.    ED Treatments / Results  Labs (all labs ordered are listed, but only abnormal results are displayed) Labs Reviewed  URINE CULTURE - Abnormal; Notable for the following components:      Result Value   Culture 50,000 COLONIES/mL CORYNEBACTERIUM SPECIES (*)    All other components within normal limits  CBC - Abnormal; Notable for the following components:   RBC 5.17 (*)    Hemoglobin 15.9 (*)    HCT 48.7 (*)    All other components within normal limits  COMPREHENSIVE METABOLIC PANEL - Abnormal; Notable for the following components:   CO2 21 (*)    Glucose, Bld 133 (*)    ALT 9 (*)    All other components within normal limits  URINALYSIS, ROUTINE W REFLEX MICROSCOPIC - Abnormal; Notable for the following components:   Leukocytes, UA MODERATE (*)    Bacteria, UA RARE (*)    Squamous Epithelial / LPF 0-5 (*)    All other components within normal limits  BASIC METABOLIC PANEL - Abnormal; Notable for the following components:   Potassium 5.9 (*)    CO2 20 (*)    Glucose, Bld 123 (*)    Calcium 8.7 (*)    All other components within normal limits  CBC - Abnormal; Notable for the following components:   Hemoglobin 15.2 (*)    All other components  within normal limits  BASIC METABOLIC PANEL - Abnormal; Notable for the following components:   CO2 20 (*)    Glucose, Bld 132 (*)    GFR calc non Af Amer 48 (*)    GFR calc Af Amer 56 (*)    All other components within normal limits  CBG MONITORING, ED - Abnormal; Notable for the following components:   Glucose-Capillary 137 (*)    All other components within normal limits  I-STAT CHEM 8, ED - Abnormal; Notable for the following components:   Glucose, Bld 135 (*)    Hemoglobin 16.7 (*)  HCT 49.0 (*)    All other components within normal limits  CULTURE, BLOOD (ROUTINE X 2)  CULTURE, BLOOD (ROUTINE X 2)  PROTIME-INR  APTT  DIFFERENTIAL  I-STAT TROPONIN, ED    EKG  EKG Interpretation  Date/Time:  Friday May 30 2017 16:01:53 EST Ventricular Rate:  68 PR Interval:    QRS Duration: 101 QT Interval:  437 QTC Calculation: 465 R Axis:   -25 Text Interpretation:  Sinus rhythm Consider left atrial enlargement Left ventricular hypertrophy No STEMI. No old tracing for comparison.  Confirmed by Alona BeneLong, Joshua [phone]([id number]) on 05/31/2017 9:57:01 AM       Radiology No results found.  Procedures Procedures (including critical care time)  Medications Ordered in ED Medications  aspirin EC tablet 81 mg (81 mg Oral Given 06/01/17 0944)  capsaicin (ZOSTRIX) 0.025 % cream 1 application (1 application Topical Given 06/01/17 0900)  polyvinyl alcohol (LIQUIFILM TEARS) 1.4 % ophthalmic solution 1 drop (not administered)  cholecalciferol (VITAMIN D) tablet 1,000 Units (1,000 Units Oral Given 06/01/17 0943)  citalopram (CELEXA) tablet 10 mg (10 mg Oral Given 06/01/17 1748)  furosemide (LASIX) tablet 20 mg (not administered)  metoprolol succinate (TOPROL-XL) 24 hr tablet 25 mg (25 mg Oral Given 06/01/17 1748)  traMADol (ULTRAM) tablet 50 mg (not administered)  cyanocobalamin tablet 500 mcg (500 mcg Oral Given 06/01/17 0943)  enoxaparin (LOVENOX) injection 40 mg (40 mg Subcutaneous Given 06/01/17  0944)  acetaminophen (TYLENOL) tablet 650 mg (650 mg Oral Given 06/01/17 1748)    Or  acetaminophen (TYLENOL) suppository 650 mg ( Rectal See Alternative 06/01/17 1748)  ondansetron (ZOFRAN) tablet 4 mg (not administered)    Or  ondansetron (ZOFRAN) injection 4 mg (not administered)  polyethylene glycol (MIRALAX / GLYCOLAX) packet 17 g (not administered)  hydrALAZINE (APRESOLINE) injection 5 mg (5 mg Intravenous Given 05/31/17 0613)  zolpidem (AMBIEN) tablet 5 mg (not administered)  aztreonam (AZACTAM) 1 g in dextrose 5 % 50 mL IVPB (0 g Intravenous Stopped 06/01/17 1708)  sodium chloride flush (NS) 0.9 % injection 3 mL (3 mLs Intravenous Given 06/01/17 0945)  sodium chloride flush (NS) 0.9 % injection 3 mL (not administered)  lisinopril (PRINIVIL,ZESTRIL) tablet 10 mg (10 mg Oral Given 06/01/17 0943)  labetalol (NORMODYNE,TRANDATE) injection 20 mg (20 mg Intravenous Given 05/30/17 1908)  sodium chloride 0.9 % bolus 1,000 mL (0 mLs Intravenous Stopped 05/30/17 2141)     Initial Impression / Assessment and Plan / ED Course  I have reviewed the triage vital signs and the nursing notes.  Pertinent labs & imaging results that were available during my care of the patient were reviewed by me and considered in my medical decision making (see chart for details).     89yF with confusion. May be from uti. Consider hypertensive urgency. W/u fairly unremarkable otherwise.   Final Clinical Impressions(s) / ED Diagnoses   Final diagnoses:  Essential hypertension  Gastroesophageal reflux disease without esophagitis  Confusion UTI  ED Discharge Orders    None       Raeford RazorKohut, Bereket Gernert, MD 06/01/17 2209

## 2017-06-01 NOTE — Progress Notes (Signed)
PROGRESS NOTE  Krystal Patrick ZOX:[id number] DOB: May 23, 1920 DOA: 05/30/2017 PCP: Kaleen Mask, MD  HPI/Recap of past 24 hours: Krystal Patrick is a 84 y.o. female with medical history significant of hypertension, GERD, depression, CAD, AAA, who presents with altered mental status, auditory and visual hallucinations. Admitted for acute metabolic encephalopathy 2/2 to UTI, poa.  06/01/17: seen and examined. Alert and oriented x 3. No new complaints. Passed bedside swallow eval by speech therapist.   Assessment/Plan: Principal Problem:   UTI (urinary tract infection) Active Problems:   Hypertension   CAD (coronary artery disease)   Acute metabolic encephalopathy   Hypertensive urgency   Depression   Pressure injury of skin  UTI (urinary tract infection), poa:  -  U/A positive - urine cx, blood cx in process - IV aztreonam -allergy to pCN  Acute metabolic encephalopathy/auditory and visual hallucination, resolved:  -CT head is negative for acute intracranial abnormalities.  -Most likely due to UTI.  -Antibiotics as above for UTI -Frequent neuro checks -resume diet  Hypertensive urgency, improving:  -elevated blood pressure 230/100, which improved to 140/80 after giving 20 mg of labetalol IV in ED.  -will continue home metoprolol, Lasix, lisinopril -IV hydralazine prn  Hyperkalemia, resolved -K+ 3.5 from 5.9 -EKG no peaked Twaves -BMP am  Hx of CAD (coronary artery disease): -continue aspirin and Lipitor  Depression: -Continue Celexa   Code Status: full   Family Communication: daughter at bedside. All questions answered to her satisfaction.  Disposition Plan: will stay another midnight to continue IV antibiotics.    Consultants:  none  Procedures:  none  Antimicrobials:  azetreonam day#2  DVT prophylaxis:  sq lovenox  Objective: Vitals:   05/31/17 1407 05/31/17 1810 05/31/17 2132 06/01/17 0115  BP: (!) 167/79 129/76 (!)  154/83 (!) 90/58  Pulse: 86 87 92 80  Resp: 18 16 18 16   Temp: 97.9 F (36.6 C) 99.1 F (37.3 C) 97.6 F (36.4 C)   TempSrc: Oral Axillary Oral   SpO2: 93% 93% 97% 96%  Weight:      Height:        Intake/Output Summary (Last 24 hours) at 06/01/2017 0806 Last data filed at 05/31/2017 1452 Gross per 24 hour  Intake 150 ml  Output 650 ml  Net -500 ml   Filed Weights   05/30/17 1607  Weight: 59.9 kg (132 lb)    Exam: 06/01/17 patient seen and examined.   General:  84 yo CF WD WN confused   Cardiovascular: RRR no rubs or gallops   Respiratory: CTA no wheezes or rales   Abdomen: soft NT ND NBS x4  Musculoskeletal: non focal  Skin: no rash  Psychiatry: unable to assess due to confusion    Data Reviewed: CBC: Recent Labs  Lab 05/30/17 1614 05/30/17 1629 05/31/17 0336  WBC 9.9  --  10.2  NEUTROABS 6.4  --   --   HGB 15.9* 16.7* 15.2*  HCT 48.7* 49.0* 45.0  MCV 94.2  --  93.2  PLT 281  --  280   Basic Metabolic Panel: Recent Labs  Lab 05/30/17 1614 05/30/17 1629 05/31/17 0336  NA 136 139 136  K 4.0 4.0 5.9*  CL 102 101 105  CO2 21*  --  20*  GLUCOSE 133* 135* 123*  BUN 16 18 12   CREATININE 0.81 0.80 0.72  CALCIUM 9.6  --  8.7*   GFR: Estimated Creatinine Clearance: 35.6 mL/min (by C-G formula based on SCr of 0.72 mg/dL). Liver Function  Tests: Recent Labs  Lab 05/30/17 1614  AST 18  ALT 9*  ALKPHOS 75  BILITOT 0.7  PROT 7.5  ALBUMIN 4.0   No results for input(s): LIPASE, AMYLASE in the last 168 hours. No results for input(s): AMMONIA in the last 168 hours. Coagulation Profile: Recent Labs  Lab 05/30/17 1614  INR 0.94   Cardiac Enzymes: No results for input(s): CKTOTAL, CKMB, CKMBINDEX, TROPONINI in the last 168 hours. BNP (last 3 results) No results for input(s): PROBNP in the last 8760 hours. HbA1C: No results for input(s): HGBA1C in the last 72 hours. CBG: Recent Labs  Lab 05/31/17 1113  GLUCAP 137*   Lipid Profile: No  results for input(s): CHOL, HDL, LDLCALC, TRIG, CHOLHDL, LDLDIRECT in the last 72 hours. Thyroid Function Tests: No results for input(s): TSH, T4TOTAL, FREET4, T3FREE, THYROIDAB in the last 72 hours. Anemia Panel: No results for input(s): VITAMINB12, FOLATE, FERRITIN, TIBC, IRON, RETICCTPCT in the last 72 hours. Urine analysis:    Component Value Date/Time   COLORURINE YELLOW 05/30/2017 1748   APPEARANCEUR CLEAR 05/30/2017 1748   LABSPEC 1.009 05/30/2017 1748   PHURINE 6.0 05/30/2017 1748   GLUCOSEU NEGATIVE 05/30/2017 1748   HGBUR NEGATIVE 05/30/2017 1748   BILIRUBINUR NEGATIVE 05/30/2017 1748   KETONESUR NEGATIVE 05/30/2017 1748   PROTEINUR NEGATIVE 05/30/2017 1748   NITRITE NEGATIVE 05/30/2017 1748   LEUKOCYTESUR MODERATE (A) 05/30/2017 1748   Sepsis Labs: @LABRCNTIP (procalcitonin:4,lacticidven:4)  )No results found for this or any previous visit (from the past 240 hour(s)).    Studies: No results found.  Scheduled Meds: . aspirin EC  81 mg Oral Daily  . capsaicin  1 application Topical Daily  . cholecalciferol  1,000 Units Oral Daily  . citalopram  10 mg Oral BID  . cyanocobalamin  500 mcg Oral Daily  . enoxaparin (LOVENOX) injection  40 mg Subcutaneous Q24H  . [START ON 06/02/2017] furosemide  20 mg Oral Once per day on Mon Wed Fri  . lisinopril  40 mg Oral Daily  . metoprolol succinate  25 mg Oral BID  . sodium chloride flush  3 mL Intravenous Q12H    Continuous Infusions: . aztreonam 1 g (06/01/17 0736)     LOS: 0 days     Darlin Droparole N Lorelei Heikkila, MD [facility] Pager [phone]  If 7PM-7AM, please contact night-coverage [email or web address] Password [email or web address] 06/01/2017, 8:06 AM

## 2017-06-01 NOTE — Evaluation (Signed)
Clinical/Bedside Swallow Evaluation Patient Details  Name: Krystal Patrick MRN: [id number] Date of Birth: 02/18/1921  Today's Date: 06/01/2017 Time: SLP Start Time (ACUTE ONLY): 0855 SLP Stop Time (ACUTE ONLY): 0908 SLP Time Calculation (min) (ACUTE ONLY): 13 min  Past Medical History:  Past Medical History:  Diagnosis Date  . AAA (abdominal aortic aneurysm) (HCC)   . CAD (coronary artery disease)   . GERD (gastroesophageal reflux disease)   . Hypertension    Past Surgical History:  Past Surgical History:  Procedure Laterality Date  . CHOLECYSTECTOMY    . SMALL BOWEL REPAIR     HPI:  Krystal Patrick a 84 y.o.femalewith medical history significant ofhypertension, GERD, depression, CAD, AAA, who presents with altered mental status,auditory and visualhallucinations.Head CT with no acute findings. Admitted for acute metabolic encephalopathy 2/2 to UTI, poa. Per RN pt with ? aspiration yesterday. Referred for swallow evaluation.   Assessment / Plan / Recommendation Clinical Impression    Patient presents with oropharyngeal swallow which appears at bedside to be within functional limits with adequate airway protection. No overt signs of aspiration observed despite challenging with consecutive straw sips of thin liquids in excess of 3oz. Mastication, clearance adequate for solids. Pt, family deny history of dysphagia. Recommend regular diet with thin liquids, medications whole with liquid. No further skilled ST needs identified. She is at mild aspiration risk due to history of GERD. Will s/o.   SLP Visit Diagnosis: Dysphagia, unspecified (R13.10)    Aspiration Risk  Mild aspiration risk    Diet Recommendation Regular;Thin liquid   Liquid Administration via: Cup;Straw Medication Administration: Whole meds with liquid Supervision: Patient able to self feed Compensations: Slow rate;Small sips/bites Postural Changes: Seated upright at 90 degrees    Other  Recommendations  Oral Care Recommendations: Oral care BID   Follow up Recommendations None      Frequency and Duration            Prognosis Prognosis for Safe Diet Advancement: Good      Swallow Study   General Date of Onset: 05/30/17 HPI: Krystal Patrick a 84 y.o.femalewith medical history significant ofhypertension, GERD, depression, CAD, AAA, who presents with altered mental status,auditory and visualhallucinations.Head CT with no acute findings. Admitted for acute metabolic encephalopathy 2/2 to UTI, poa. Per RN pt with ? aspiration yesterday. Referred for swallow evaluation. Type of Study: Bedside Swallow Evaluation Previous Swallow Assessment: none on file Diet Prior to this Study: NPO Temperature Spikes Noted: No Respiratory Status: Room air History of Recent Intubation: No Behavior/Cognition: Alert;Cooperative;Pleasant mood Oral Cavity Assessment: Within Functional Limits Oral Care Completed by SLP: No Oral Cavity - Dentition: Adequate natural dentition Vision: Functional for self-feeding Self-Feeding Abilities: Able to feed self;Needs set up Patient Positioning: Upright in bed Baseline Vocal Quality: Normal Volitional Cough: Strong Volitional Swallow: Able to elicit    Oral/Motor/Sensory Function Overall Oral Motor/Sensory Function: Within functional limits   Ice Chips Ice chips: Not tested   Thin Liquid Thin Liquid: Within functional limits Presentation: Cup;Self Fed;Straw    Nectar Thick Nectar Thick Liquid: Not tested   Honey Thick Honey Thick Liquid: Not tested   Puree Puree: Within functional limits Presentation: Self Fed;Spoon   Solid   GO   Solid: Within functional limits Presentation: Self Fed       Rondel BatonMary Beth Clella Mckeel, MS, CCC-SLP Speech-Language Pathologist [phone]  Krystal Patrick 06/01/2017,9:11 AM

## 2017-06-02 ENCOUNTER — Inpatient Hospital Stay (HOSPITAL_COMMUNITY): Payer: Medicare Other

## 2017-06-02 DIAGNOSIS — R52 Pain, unspecified: Secondary | ICD-10-CM

## 2017-06-02 LAB — GLUCOSE, CAPILLARY: Glucose-Capillary: 79 mg/dL (ref 65–99)

## 2017-06-02 MED ORDER — CEPHALEXIN 250 MG PO CAPS
250.0000 mg | ORAL_CAPSULE | Freq: Two times a day (BID) | ORAL | Status: DC
  Administered 2017-06-02 – 2017-06-03 (×3): 250 mg via ORAL
  Filled 2017-06-02 (×3): qty 1

## 2017-06-02 MED ORDER — DICLOFENAC SODIUM 1 % TD GEL
2.0000 g | Freq: Three times a day (TID) | TRANSDERMAL | Status: DC
  Administered 2017-06-02 – 2017-06-03 (×4): 2 g via TOPICAL
  Filled 2017-06-02: qty 100

## 2017-06-02 NOTE — Plan of Care (Signed)
  Progressing Education: Knowledge of General Education information will improve 06/02/2017 1005 - Progressing by Quentin CornwallMadison, Nzinga Ferran, RN Health Behavior/Discharge Planning: Ability to manage health-related needs will improve 06/02/2017 1005 - Progressing by Quentin CornwallMadison, Elaijah Munoz, RN Clinical Measurements: Ability to maintain clinical measurements within normal limits will improve 06/02/2017 1005 - Progressing by Quentin CornwallMadison, Cashius Grandstaff, RN Will remain free from infection 06/02/2017 1005 - Progressing by Quentin CornwallMadison, Naomii Kreger, RN Diagnostic test results will improve 06/02/2017 1005 - Progressing by Quentin CornwallMadison, Randie Bloodgood, RN Respiratory complications will improve 06/02/2017 1005 - Progressing by Quentin CornwallMadison, Adyn Serna, RN Cardiovascular complication will be avoided 06/02/2017 1005 - Progressing by Quentin CornwallMadison, Duvall Comes, RN Activity: Risk for activity intolerance will decrease 06/02/2017 1005 - Progressing by Quentin CornwallMadison, Shanyn Preisler, RN Nutrition: Adequate nutrition will be maintained 06/02/2017 1005 - Progressing by Quentin CornwallMadison, Itai Barbian, RN Coping: Level of anxiety will decrease 06/02/2017 1005 - Progressing by Quentin CornwallMadison, Morgane Joerger, RN Elimination: Will not experience complications related to bowel motility 06/02/2017 1005 - Progressing by Quentin CornwallMadison, Fount Bahe, RN Will not experience complications related to urinary retention 06/02/2017 1005 - Progressing by Quentin CornwallMadison, Anushree Dorsi, RN Pain Managment: General experience of comfort will improve 06/02/2017 1005 - Progressing by Quentin CornwallMadison, Myrene Bougher, RN Safety: Ability to remain free from injury will improve 06/02/2017 1005 - Progressing by Quentin CornwallMadison, Blue Winther, RN Skin Integrity: Risk for impaired skin integrity will decrease 06/02/2017 1005 - Progressing by Quentin CornwallMadison, Delante Karapetyan, RN

## 2017-06-02 NOTE — Progress Notes (Signed)
PROGRESS NOTE  Krystal Patrick XBJ:[id number] DOB: [birth year] DOA: 05/30/2017 PCP: Kaleen Mask, MD  HPI/Recap of past 24 hours: Krystal Patrick is a 84 y.o. female with medical history significant of hypertension, GERD, depression, CAD, AAA, who presents with altered mental status, auditory and visual hallucinations. Admitted for acute metabolic encephalopathy 2/2 to UTI, poa.  06/02/17: Reports persistent generalized weakness and fatigue. Also reports lower back pain. Per her daughter she fell twice prior to her presentation on 04/21/17 and 05/15/17, landed on her buttocks. Hx of osteoporosis. lumbar xray and pelvic xray. Voltaren gel.   Assessment/Plan: Principal Problem:   UTI (urinary tract infection) Active Problems:   Hypertension   CAD (coronary artery disease)   Acute metabolic encephalopathy   Hypertensive urgency   Depression   Pressure injury of skin  UTI (urinary tract infection), poa:  -  U/A positive - urine cx grew 50,000 colonies corynebacterim species  - blood cx x2 no growth - switch antibiotic aztreonam to po keflex - allergy to pCN  Acute metabolic encephalopathy/auditory and visual hallucination, resolved:  -CT head is negative for acute intracranial abnormalities.  -Most likely due to UTI.  -Antibiotics as above for UTI -Frequent neuro checks -resume diet  Intractable lower back pain 2/2 to fall -lumbar and pelvic xrays -voltaren gel as needed -not interested in aggressive management if fracture present  Hypertensive urgency, improving:  -elevated blood pressure 230/100, which improved to 140/80 after giving 20 mg of labetalol IV in ED.  -will continue home metoprolol, Lasix, lisinopril -IV hydralazine prn  Hyperkalemia, resolved -K+ 3.5 from 5.9 -EKG no peaked Twaves -BMP am  Hx of CAD (coronary artery disease): -continue aspirin and Lipitor  Depression: -Continue Celexa  Ambulatory dysfunction -PT evaluate and  treat -fall precaution  Generalized weakness/fatigue -OOB to chair -PT -increase oral intake   Code Status: full   Family Communication: daughter at bedside. All questions answered to her satisfaction.  Disposition Plan: will stay another midnight to continue antibiotics, increase oral intake.    Consultants:  none  Procedures:  none  Antimicrobials:  Completed 2 days of azetreonam  Po keflex  DVT prophylaxis:  sq lovenox  Objective: Vitals:   06/01/17 2217 06/02/17 0124 06/02/17 0500 06/02/17 0646  BP: (!) 152/73 (!) 198/73 (!) 151/107 (!) 102/53  Pulse: 60 (!) 58 66   Resp: 18 16 16    Temp: 98.2 F (36.8 C) 98.2 F (36.8 C) 98.5 F (36.9 C)   TempSrc: Oral Oral Oral   SpO2: 97% 97% 95%   Weight:      Height:        Intake/Output Summary (Last 24 hours) at 06/02/2017 0833 Last data filed at 06/02/2017 0053 Gross per 24 hour  Intake 150 ml  Output 100 ml  Net 50 ml   Filed Weights   05/30/17 1607  Weight: 59.9 kg (132 lb)    Exam: 06/02/17 patient seen and examined.   General:  84 yo CF WD WN A&O x3  Cardiovascular: RRR no rubs or gallops   Respiratory: CTA no wheezes or rales   Abdomen: soft NT ND NBS x4  Musculoskeletal: non focal  Skin: no rash  Psychiatry: mood is appropriate for condition and setting   Data Reviewed: CBC: Recent Labs  Lab 05/30/17 1614 05/30/17 1629 05/31/17 0336  WBC 9.9  --  10.2  NEUTROABS 6.4  --   --   HGB 15.9* 16.7* 15.2*  HCT 48.7* 49.0* 45.0  MCV 94.2  --  93.2  PLT 281  --  280   Basic Metabolic Panel: Recent Labs  Lab 05/30/17 1614 05/30/17 1629 05/31/17 0336 06/01/17 0928  NA 136 139 136 136  K 4.0 4.0 5.9* 3.5  CL 102 101 105 102  CO2 21*  --  20* 20*  GLUCOSE 133* 135* 123* 132*  BUN 16 18 12 17   CREATININE 0.81 0.80 0.72 0.97  CALCIUM 9.6  --  8.7* 9.1   GFR: Estimated Creatinine Clearance: 29.3 mL/min (by C-G formula based on SCr of 0.97 mg/dL). Liver Function  Tests: Recent Labs  Lab 05/30/17 1614  AST 18  ALT 9*  ALKPHOS 75  BILITOT 0.7  PROT 7.5  ALBUMIN 4.0   No results for input(s): LIPASE, AMYLASE in the last 168 hours. No results for input(s): AMMONIA in the last 168 hours. Coagulation Profile: Recent Labs  Lab 05/30/17 1614  INR 0.94   Cardiac Enzymes: No results for input(s): CKTOTAL, CKMB, CKMBINDEX, TROPONINI in the last 168 hours. BNP (last 3 results) No results for input(s): PROBNP in the last 8760 hours. HbA1C: No results for input(s): HGBA1C in the last 72 hours. CBG: Recent Labs  Lab 05/31/17 1113 06/02/17 0605  GLUCAP 137* 79   Lipid Profile: No results for input(s): CHOL, HDL, LDLCALC, TRIG, CHOLHDL, LDLDIRECT in the last 72 hours. Thyroid Function Tests: No results for input(s): TSH, T4TOTAL, FREET4, T3FREE, THYROIDAB in the last 72 hours. Anemia Panel: No results for input(s): VITAMINB12, FOLATE, FERRITIN, TIBC, IRON, RETICCTPCT in the last 72 hours. Urine analysis:    Component Value Date/Time   COLORURINE YELLOW 05/30/2017 1748   APPEARANCEUR CLEAR 05/30/2017 1748   LABSPEC 1.009 05/30/2017 1748   PHURINE 6.0 05/30/2017 1748   GLUCOSEU NEGATIVE 05/30/2017 1748   HGBUR NEGATIVE 05/30/2017 1748   BILIRUBINUR NEGATIVE 05/30/2017 1748   KETONESUR NEGATIVE 05/30/2017 1748   PROTEINUR NEGATIVE 05/30/2017 1748   NITRITE NEGATIVE 05/30/2017 1748   LEUKOCYTESUR MODERATE (A) 05/30/2017 1748   Sepsis Labs: @LABRCNTIP (procalcitonin:4,lacticidven:4)  ) Recent Results (from the past 240 hour(s))  Urine culture     Status: Abnormal   Collection Time: 05/30/17  7:40 PM  Result Value Ref Range Status   Specimen Description URINE, CLEAN CATCH  Final   Special Requests NONE  Final   Culture 50,000 COLONIES/mL CORYNEBACTERIUM SPECIES (A)  Final   Report Status 06/01/2017 FINAL  Final  Culture, blood (Routine X 2) w Reflex to ID Panel     Status: None (Preliminary result)   Collection Time: 05/30/17 11:06  PM  Result Value Ref Range Status   Specimen Description BLOOD RIGHT WRIST  Final   Special Requests   Final    BOTTLES DRAWN AEROBIC AND ANAEROBIC Blood Culture adequate volume   Culture NO GROWTH 1 DAY  Final   Report Status PENDING  Incomplete  Culture, blood (Routine X 2) w Reflex to ID Panel     Status: None (Preliminary result)   Collection Time: 05/30/17 11:12 PM  Result Value Ref Range Status   Specimen Description BLOOD LEFT ANTECUBITAL  Final   Special Requests   Final    BOTTLES DRAWN AEROBIC AND ANAEROBIC Blood Culture adequate volume   Culture NO GROWTH 1 DAY  Final   Report Status PENDING  Incomplete      Studies: No results found.  Scheduled Meds: . aspirin EC  81 mg Oral Daily  . capsaicin  1 application Topical Daily  . cholecalciferol  1,000 Units Oral Daily  .  citalopram  10 mg Oral BID  . cyanocobalamin  500 mcg Oral Daily  . diclofenac sodium  2 g Topical TID  . enoxaparin (LOVENOX) injection  40 mg Subcutaneous Q24H  . furosemide  20 mg Oral Once per day on Mon Wed Fri  . lisinopril  10 mg Oral Daily  . metoprolol succinate  25 mg Oral BID  . sodium chloride flush  3 mL Intravenous Q12H    Continuous Infusions: . aztreonam 1 g (06/02/17 0831)     LOS: 1 day     Darlin Droparole N Audra Bellard, MD [facility] Pager [phone]  If 7PM-7AM, please contact night-coverage [email or web address] Password [email or web address] 06/02/2017, 8:33 AM

## 2017-06-02 NOTE — Progress Notes (Signed)
NP Blount ordered 7.5 mg IV toradol. No K Pad. Toradol given, without much relief. Blood pressure was high, and I feel it was d/t her back pain. Warm pack, pillow, reposition and tylenol given again when it was due. Patient sleeping soundly. Will continue to monitor.

## 2017-06-03 MED ORDER — DICLOFENAC SODIUM 1 % TD GEL: 2.0000 g | Freq: Three times a day (TID) | TRANSDERMAL | 0 refills | Status: DC

## 2017-06-03 MED ORDER — CEPHALEXIN 250 MG PO CAPS: 250.0000 mg | ORAL_CAPSULE | Freq: Two times a day (BID) | ORAL | 0 refills | Status: AC

## 2017-06-03 NOTE — Discharge Summary (Signed)
Discharge Summary  Krystal SavoyBetty Patrick BJY:[id number] DOB: June 01, 1920  PCP: Kaleen MaskElkins, Wilson Oliver, MD  Admit date: 05/30/2017 Discharge date: 06/03/2017  Time spent: 25 minutes   Recommendations for Outpatient Follow-up:  1. Follow up with PCP post hospitalization 2. Take your medications as prescribed 3. Fall precaution 4. Home health with PT 5. Continue yearly monitoring of your aortic aneurysm by your PCP 6. Wheel chair for ambulatory dysfunction   Discharge Diagnoses:  Active Hospital Problems   Diagnosis Date Noted  . UTI (urinary tract infection) 05/30/2017  . Pressure injury of skin 05/31/2017  . Acute metabolic encephalopathy 05/30/2017  . Hypertensive urgency 05/30/2017  . Depression 05/30/2017  . Hypertension   . CAD (coronary artery disease)     Resolved Hospital Problems  No resolved problems to display.    Discharge Condition: stable  Diet recommendation: resume previous diet   Vitals:   06/03/17 0930 06/03/17 1453  BP: (!) 118/50 (!) 155/55  Pulse: 62 (!) 57  Resp: 18 18  Temp: 97.7 F (36.5 C) (!) 97.5 F (36.4 C)  SpO2: 95% 93%    History of present illness:  Krystal RhodesBetty Bloomingburgis a 84 y.o.femalewith medical history significant ofhypertension, GERD, depression, CAD, AAA, who presents with altered mental status, auditory and visual hallucinations. Admitted for acute metabolic encephalopathy 2/2 to UTI, poa.  06/02/17: Reports persistent generalized weakness and fatigue. Also reports lower back pain. Per her daughter she fell twice prior to her presentation on 04/21/17 and 05/15/17, landed on her buttocks. Hx of osteoporosis. lumbar xray and pelvic xray negative for any acute fracture. Voltaren gel.  On the day of discharge the patient was hemodynamically stable. She was tolerating a diet well without nausea or vomiting. She will need to follow up with her PCP post hospitalization. She was evaluated by PT who recommended Home health PT.   Hospital  Course:  Principal Problem:   UTI (urinary tract infection) Active Problems:   Hypertension   CAD (coronary artery disease)   Acute metabolic encephalopathy   Hypertensive urgency   Depression   Pressure injury of skin  UTI (urinary tract infection), poa: - U/A positive - urine cx grew 50,000 colonies corynebacterim species  - blood cx x2 no growth - switch antibiotic aztreonam to po keflex - allergy to pCN  Acute metabolic encephalopathy/auditory and visual hallucination, resolved:  -CT head is negative for acute intracranial abnormalities.  -Most likely due to UTI.  -Antibiotics as above for UTI -Frequent neuro checks -resume diet  Intractable lower back pain 2/2 to fall -lumbar and pelvic xrays negative for any acute fracture -voltaren gel as needed -not interested in aggressive management if fracture present  Infrarenal abdominal aortic aneurysm measuring 3.9 cm in anterior-posterior dimension. -daughter is aware of this and reports (06/03/17) that the patient has U/S yearly to monitor.  Hypertensive urgency, improving:  -elevated blood pressure 230/100, which improved to 140/80after giving 20 mg of labetalol IVin ED.  -improving -will continue homemetoprolol, Lasix, lisinopril -Follow up with PCP  Hyperkalemia, resolved -K+ 3.5 from 5.9 -EKG no peaked Twaves -BMP am  Hx ofCAD (coronary artery disease): -continueaspirin and Lipitor  Depression: -Continue Celexa  Ambulatory dysfunction -PT evaluate and treat -fall precaution  Generalized weakness/fatigue/ambulatory dysfunction -OOB to chair -PT -wheelchair for ambulation   Procedures:  none  Consultations:  none  Discharge Exam: BP (!) 155/55 (BP Location: Right Arm)   Pulse (!) 57   Temp (!) 97.5 F (36.4 C) (Oral)   Resp 18   Ht  5\' 3"  (1.6 m)   Wt 59.9 kg (132 lb)   SpO2 93%   BMI 23.38 kg/m   General: 84 yo CF WD WN NAD A&O x3 Cardiovascular: RRR no rubs or gallops    Respiratory: CTA no wheezes or rales   Discharge Instructions You were cared for by a hospitalist during your hospital stay. If you have any questions about your discharge medications or the care you received while you were in the hospital after you are discharged, you can call the unit and asked to speak with the hospitalist on call if the hospitalist that took care of you is not available. Once you are discharged, your primary care physician will handle any further medical issues. Please note that NO REFILLS for any discharge medications will be authorized once you are discharged, as it is imperative that you return to your primary care physician (or establish a relationship with a primary care physician if you do not have one) for your aftercare needs so that they can reassess your need for medications and monitor your lab values.   Allergies as of 06/03/2017      Reactions   Penicillins Rash      Medication List    STOP taking these medications   traMADol 50 MG tablet Commonly known as:  ULTRAM     TAKE these medications   ARTHRITIS PAIN RELIEF 0.025 % cream Generic drug:  capsaicin Apply 1 application topically daily.   aspirin EC 81 MG tablet Take 81 mg by mouth daily.   cephALEXin 250 MG capsule Commonly known as:  KEFLEX Take 1 capsule (250 mg total) by mouth every 12 (twelve) hours for 5 days.   citalopram 10 MG tablet Commonly known as:  CELEXA Take 10 mg by mouth 2 (two) times daily. 1 or 2 tablets daily for depression   [employer] LUBRICATING/DRY EYE 0.5-0.9 % ophthalmic solution Generic drug:  carboxymethylcellul-glycerin Place 1 drop into both eyes as needed for dry eyes.   diclofenac sodium 1 % Gel Commonly known as:  VOLTAREN Apply 2 g topically 3 (three) times daily.   furosemide 20 MG tablet Commonly known as:  LASIX Take 20 mg by mouth 3 (three) times a week.   lisinopril 40 MG tablet Commonly known as:  PRINIVIL,ZESTRIL Take 40 mg by mouth daily.    metoprolol succinate 25 MG 24 hr tablet Commonly known as:  TOPROL-XL Take 25 mg by mouth 2 (two) times daily.   vitamin B-12 500 MCG tablet Commonly known as:  CYANOCOBALAMIN Take 500 mcg by mouth daily.   Vitamin D3 10000 units Tabs Take 1,000 Units by mouth daily.            Durable Medical Equipment  (From admission, onward)        Start     Ordered   06/03/17 1610  For home use only DME standard manual wheelchair with seat cushion  Once    Comments:  Patient suffers from ambulatory dysfunction which impairs their ability to perform daily activities like ADLs in the home.  A walking aid will not resolve issue with performing activities of daily living. A wheelchair will allow patient to safely perform daily activities. Patient can safely propel the wheelchair in the home or has a caregiver who can provide assistance.  Accessories: elevating leg rests (ELRs), wheel locks, extensions and anti-tippers.   06/03/17 1610   06/03/17 1132  For home use only DME 3 n 1  Once  06/03/17 1132   06/03/17 1132  For home use only DME lightweight manual wheelchair with seat cushion  Once    Comments:  Patient suffers from weakness which impairs their ability to perform daily activities like walking in the home.  A walker will not resolve  issue with performing activities of daily living. A wheelchair will allow patient to safely perform daily activities. Patient is not able to propel themselves in the home using a standard weight wheelchair due to weakness. Patient can self propel in the lightweight wheelchair.  Accessories: elevating leg rests (ELRs), wheel locks, extensions and anti-tippers.   06/03/17 1132     Allergies  Allergen Reactions  . Penicillins Rash   Follow-up Information    [facility] - Dme Follow up.   Why:  3/1 and wheelchair to be delivered to room prior to DC Contact information: [address] [place] [address] [phone]         [facility] Follow up.   Specialty:  Home Health Services Why:  for home health services will contact you in 1-2 days to schedule first apt.  Contact information: [address] [place] [address] [phone]        Kaleen Mask, MD. Schedule an appointment as soon as possible for a visit in 1 day(s).   Specialty:  Family Medicine Why:  you need to make appointment in the next day so that he can sign home health orders. He requires an office visit prior to allowing patients to start home health.  Contact information: [address] [place] [address] [phone]            The results of significant diagnostics from this hospitalization (including imaging, microbiology, ancillary and laboratory) are listed below for reference.    Significant Diagnostic Studies: Dg Lumbar Spine 2-3 Views  Result Date: 06/02/2017 CLINICAL DATA:  Status post fall.  Right-sided pain. EXAM: LUMBAR SPINE - 2-3 VIEW COMPARISON:  None. FINDINGS: There are 5 nonrib bearing lumbar-type vertebral bodies. The vertebral body heights are maintained. There is minimal grade 1 anterolisthesis of L4 on L5. There is no spondylolysis. There is no acute fracture. There is mild degenerative disc disease with disc height loss at L4-5 and L5-S1. There is bilateral facet arthropathy at L4-5 and L5-S1. The SI joints are unremarkable. There is abdominal aortic atherosclerosis. Infrarenal abdominal aortic aneurysm measuring 3.9 cm in anterior-posterior dimension. IMPRESSION: 1.  No acute osseous injury of the lumbar spine. 2. Infrarenal abdominal aortic aneurysm measuring 3.9 cm in anterior-posterior dimension. Electronically Signed   By: Elige Ko   On: 06/02/2017 13:11   Ct Head Wo Contrast  Result Date: 05/30/2017 CLINICAL DATA:  Hypertension with neurologic deficits. EXAM: CT HEAD WITHOUT CONTRAST TECHNIQUE: Contiguous axial images were obtained from the base of  the skull through the vertex without intravenous contrast. COMPARISON:  MRI of the brain 02/12/2015 FINDINGS: Brain: No evidence of acute infarction, hemorrhage, hydrocephalus, extra-axial collection or mass lesion/mass effect. Moderate brain parenchymal volume loss and deep white matter microangiopathy. Vascular: Calcific atherosclerotic disease of the intra cavernous internal carotid arteries. Skull: Normal. Negative for fracture or focal lesion. Sinuses/Orbits: Right maxillary and ethmoid sinusitis. Other: None. IMPRESSION: No acute intracranial abnormality. Atrophy, chronic microvascular disease. Electronically Signed   By: Ted Mcalpine M.D.   On: 05/30/2017 17:01   Dg Pelvis Portable  Result Date: 06/02/2017 CLINICAL DATA:  Patient is complaining of right sided iliac crest  and lumbar region pain. The patient has fallen twice in the past 2 weeks. EXAM: PORTABLE PELVIS 1-2 VIEWS COMPARISON:  None in PACs FINDINGS: The bones are subjectively osteopenic. No acute pelvic fracture is observed. Visualization of the sacrum is limited due to overlying bowel gas and stool. The pubic rami are intact. The hip joint spaces are reasonably well-maintained. No acute fracture of either hip is observed. IMPRESSION: There is diffuse osteopenia. No acute fracture or dislocation is observed. Electronically Signed   By: David  [place] M.D.   On: 06/02/2017 13:10    Microbiology: Recent Results (from the past 240 hour(s))  Urine culture     Status: Abnormal   Collection Time: 05/30/17  7:40 PM  Result Value Ref Range Status   Specimen Description URINE, CLEAN CATCH  Final   Special Requests NONE  Final   Culture 50,000 COLONIES/mL CORYNEBACTERIUM SPECIES (A)  Final   Report Status 06/01/2017 FINAL  Final  Culture, blood (Routine X 2) w Reflex to ID Panel     Status: None (Preliminary result)   Collection Time: 05/30/17 11:06 PM  Result Value Ref Range Status   Specimen Description BLOOD RIGHT WRIST  Final    Special Requests   Final    BOTTLES DRAWN AEROBIC AND ANAEROBIC Blood Culture adequate volume   Culture NO GROWTH 3 DAYS  Final   Report Status PENDING  Incomplete  Culture, blood (Routine X 2) w Reflex to ID Panel     Status: None (Preliminary result)   Collection Time: 05/30/17 11:12 PM  Result Value Ref Range Status   Specimen Description BLOOD LEFT ANTECUBITAL  Final   Special Requests   Final    BOTTLES DRAWN AEROBIC AND ANAEROBIC Blood Culture adequate volume   Culture NO GROWTH 3 DAYS  Final   Report Status PENDING  Incomplete     Labs: Basic Metabolic Panel: Recent Labs  Lab 05/30/17 1614 05/30/17 1629 05/31/17 0336 06/01/17 0928  NA 136 139 136 136  K 4.0 4.0 5.9* 3.5  CL 102 101 105 102  CO2 21*  --  20* 20*  GLUCOSE 133* 135* 123* 132*  BUN 16 18 12 17   CREATININE 0.81 0.80 0.72 0.97  CALCIUM 9.6  --  8.7* 9.1   Liver Function Tests: Recent Labs  Lab 05/30/17 1614  AST 18  ALT 9*  ALKPHOS 75  BILITOT 0.7  PROT 7.5  ALBUMIN 4.0   No results for input(s): LIPASE, AMYLASE in the last 168 hours. No results for input(s): AMMONIA in the last 168 hours. CBC: Recent Labs  Lab 05/30/17 1614 05/30/17 1629 05/31/17 0336  WBC 9.9  --  10.2  NEUTROABS 6.4  --   --   HGB 15.9* 16.7* 15.2*  HCT 48.7* 49.0* 45.0  MCV 94.2  --  93.2  PLT 281  --  280   Cardiac Enzymes: No results for input(s): CKTOTAL, CKMB, CKMBINDEX, TROPONINI in the last 168 hours. BNP: BNP (last 3 results) No results for input(s): BNP in the last 8760 hours.  ProBNP (last 3 results) No results for input(s): PROBNP in the last 8760 hours.  CBG: Recent Labs  Lab 05/31/17 1113 06/02/17 0605  GLUCAP 137* 79       Signed:  Darlin Drop, MD [facility] 06/03/2017, 4:11 PM

## 2017-06-03 NOTE — Progress Notes (Signed)
Spoke w daughter, [facility] [place]. Paged Dr Margo AyeHall, requested cosign for DME and DME necessity note.

## 2017-06-03 NOTE — Plan of Care (Signed)
  Progressing Education: Knowledge of General Education information will improve 06/03/2017 1123 - Progressing by Quentin CornwallMadison, Katena Petitjean, RN Health Behavior/Discharge Planning: Ability to manage health-related needs will improve 06/03/2017 1123 - Progressing by Quentin CornwallMadison, Keith Felten, RN Clinical Measurements: Ability to maintain clinical measurements within normal limits will improve 06/03/2017 1123 - Progressing by Quentin CornwallMadison, Krystal Delduca, RN Will remain free from infection 06/03/2017 1123 - Progressing by Quentin CornwallMadison, Malique Driskill, RN Diagnostic test results will improve 06/03/2017 1123 - Progressing by Quentin CornwallMadison, Zakayla Martinec, RN Respiratory complications will improve 06/03/2017 1123 - Progressing by Quentin CornwallMadison, Navid Lenzen, RN Cardiovascular complication will be avoided 06/03/2017 1123 - Progressing by Quentin CornwallMadison, Ludger Bones, RN Activity: Risk for activity intolerance will decrease 06/03/2017 1123 - Progressing by Quentin CornwallMadison, Ojani Berenson, RN Nutrition: Adequate nutrition will be maintained 06/03/2017 1123 - Progressing by Quentin CornwallMadison, Phillip Sandler, RN Coping: Level of anxiety will decrease 06/03/2017 1123 - Progressing by Quentin CornwallMadison, Jhan Conery, RN Elimination: Will not experience complications related to bowel motility 06/03/2017 1123 - Progressing by Quentin CornwallMadison, Tobi Leinweber, RN Will not experience complications related to urinary retention 06/03/2017 1123 - Progressing by Quentin CornwallMadison, Eaven Schwager, RN Pain Managment: General experience of comfort will improve 06/03/2017 1123 - Progressing by Quentin CornwallMadison, Onesimo Lingard, RN Safety: Ability to remain free from injury will improve 06/03/2017 1123 - Progressing by Quentin CornwallMadison, Charlaine Utsey, RN Skin Integrity: Risk for impaired skin integrity will decrease 06/03/2017 1123 - Progressing by Quentin CornwallMadison, Inri Sobieski, RN

## 2017-06-03 NOTE — Care Management (Addendum)
    Durable Medical Equipment  (From admission, onward)        Start     Ordered   06/03/17 1132  For home use only DME 3 n 1  Once     06/03/17 1132   06/03/17 1132  For home use only DME lightweight manual wheelchair with seat cushion  Once    Comments:  Patient suffers from weakness which impairs their ability to perform daily activities like walking in the home.  A walker will not resolve  issue with performing activities of daily living. A wheelchair will allow patient to safely perform daily activities. Patient is not able to propel themselves in the home using a standard weight wheelchair due to weakness. Patient can self propel in the lightweight wheelchair.  Accessories: elevating leg rests (ELRs), wheel locks, extensions and anti-tippers.   06/03/17 1132

## 2017-06-03 NOTE — Evaluation (Signed)
Physical Therapy Evaluation Patient Details Name: Krystal Patrick MRN: [id number] DOB: Aug 09, 1920 Today's Date: 06/03/2017   History of Present Illness  : Krystal Patrick is a 84 y.o. female with medical history significant of hypertension, GERD, depression, CAD, AAA, who presents with altered mental status, hallucination.. Pt found to have a UTI.  Clinical Impression  Pt admitted with above. Pt with no further hallucinations. Pt functioning near baseline. Pt with good home set up and support. Acute PT to follow.    Follow Up Recommendations Home health PT;Supervision - Intermittent    Equipment Recommendations  None recommended by PT    Recommendations for Other Services       Precautions / Restrictions Precautions Precautions: Fall Precaution Comments: macular degeneration Restrictions Weight Bearing Restrictions: No      Mobility  Bed Mobility Overal bed mobility: Modified Independent             General bed mobility comments: HOB flat, pushed up from sidelying  Transfers Overall transfer level: Needs assistance Equipment used: Rolling walker (2 wheeled) Transfers: Sit to/from Stand Sit to Stand: Min guard         General transfer comment: v/cs to push up from bed not walker, increased time but steady  Ambulation/Gait Ambulation/Gait assistance: Min guard Ambulation Distance (Feet): 100 Feet Assistive device: Rolling walker (2 wheeled) Gait Pattern/deviations: Step-through pattern;Decreased stride length Gait velocity: slow Gait velocity interpretation: Below normal speed for age/gender General Gait Details: v/c;s to stay in walker, no SOB or LOB  Stairs            Wheelchair Mobility    Modified Rankin (Stroke Patients Only)       Balance Overall balance assessment: Needs assistance Sitting-balance support: Feet supported;No upper extremity supported Sitting balance-Leahy Scale: Good Sitting balance - Comments: able to cross legs  and pull socks up   Standing balance support: Bilateral upper extremity supported Standing balance-Leahy Scale: Fair                               Pertinent Vitals/Pain Pain Assessment: No/denies pain    Home Living Family/patient expects to be discharged to:: Private residence Living Arrangements: Children(daughter) Available Help at Discharge: Family;Available 24 hours/day Type of Home: House Home Access: Stairs to enter Entrance Stairs-Rails: Right;Left;Can reach both Entrance Stairs-Number of Steps: 2 Home Layout: Two level;Able to live on main level with bedroom/bathroom Home Equipment: Dan Humphreys - 2 wheels;Walker - 4 wheels;Grab bars - toilet;Grab bars - tub/shower      Prior Function Level of Independence: Independent with assistive device(s)         Comments: rolling walker.      Hand Dominance   Dominant Hand: Right    Extremity/Trunk Assessment   Upper Extremity Assessment Upper Extremity Assessment: (wfl for age)    Lower Extremity Assessment Lower Extremity Assessment: (wfl of age)    Cervical / Trunk Assessment Cervical / Trunk Assessment: Kyphotic  Communication   Communication: No difficulties  Cognition Arousal/Alertness: Awake/alert Behavior During Therapy: WFL for tasks assessed/performed Overall Cognitive Status: Within Functional Limits for tasks assessed                                        General Comments      Exercises     Assessment/Plan    PT Assessment Patient needs continued PT services  PT Problem List Decreased strength;Decreased range of motion;Decreased activity tolerance;Decreased balance;Decreased mobility;Decreased cognition;Decreased coordination;Decreased knowledge of use of DME;Decreased safety awareness       PT Treatment Interventions DME instruction;Gait training;Therapeutic activities;Stair training;Functional mobility training;Therapeutic exercise;Balance training;Neuromuscular  re-education    PT Goals (Current goals can be found in the Care Plan section)  Acute Rehab PT Goals Patient Stated Goal: home PT Goal Formulation: With patient Time For Goal Achievement: 06/17/17 Potential to Achieve Goals: Good    Frequency Min 3X/week   Barriers to discharge        Co-evaluation               AM-PAC PT "6 Clicks" Daily Activity  Outcome Measure Difficulty turning over in bed (including adjusting bedclothes, sheets and blankets)?: None Difficulty moving from lying on back to sitting on the side of the bed? : None Difficulty sitting down on and standing up from a chair with arms (e.g., wheelchair, bedside commode, etc,.)?: A Little Help needed moving to and from a bed to chair (including a wheelchair)?: A Little Help needed walking in hospital room?: A Little Help needed climbing 3-5 steps with a railing? : A Little 6 Click Score: 20    End of Session Equipment Utilized During Treatment: Gait belt Activity Tolerance: Patient tolerated treatment well Patient left: in chair;with call bell/phone within reach;with nursing/sitter in room Nurse Communication: Mobility status PT Visit Diagnosis: Unsteadiness on feet (R26.81)    Time: [id number] PT Time Calculation (min) (ACUTE ONLY): 17 min   Charges:   PT Evaluation $PT Eval Moderate Complexity: 1 Mod     PT G CodesLewis Shock:        Krystal Patrick, PT, DPT Pager #: [phone] Office #: [phone]   Krystal Patrick M [name] 06/03/2017, 9:11 AM

## 2017-06-03 NOTE — Progress Notes (Signed)
Pt discharged home with daughter. HH arranged by CM. Pt to [facility] equipment tomorrow [facility] [address] per daughter request. Lupita LeashDonna with [facility] knows and stated she will make sure equipment is there tomorrow for her to pick up. All belongings sent with patient. AVS given to and reviewed with patient and daughter, scripts ready at their pharmacy. VSS. BP (!) 155/55 (BP Location: Right Arm)   Pulse (!) 57   Temp (!) 97.5 F (36.4 C) (Oral)   Resp 18   Ht 5\' 3"  (1.6 m)   Wt 59.9 kg (132 lb)   SpO2 93%   BMI 23.38 kg/m

## 2017-06-03 NOTE — Care Management Note (Signed)
Case Management Note  Patient Details  Name: Krystal Patrick MRN: [id number] Date of Birth: 1920-07-30  Subjective/Objective:                 Spoke w patient and daughter at bedside. They would like to use [facility], have used them in the past. Would also like 3/1 and light WC. Discussed PD care resources on Medicare website. [facility]  Awaiting HH orders.    Action/Plan:   Expected Discharge Date:                  Expected Discharge Plan:  Home w Home Health Services  In-House Referral:     Discharge planning Services  CM Consult  Post Acute Care Choice:  Durable Medical Equipment, Home Health Choice offered to:  Patient, Adult Children  DME Arranged:  3-N-1, Lightweight manual wheelchair with seat cushion DME Agency:  [facility]  HH Arranged:    HH Agency:  [facility]  Status of Service:  Completed, signed off  If discussed at Long Length of Stay Meetings, dates discussed:    Additional Comments:  Lawerance SabalDebbie Benigna Delisi, RN 06/03/2017, 11:36 AM

## 2017-06-03 NOTE — Discharge Instructions (Signed)

## 2017-06-05 LAB — CULTURE, BLOOD (ROUTINE X 2)
CULTURE: NO GROWTH
CULTURE: NO GROWTH
SPECIAL REQUESTS: ADEQUATE
Special Requests: ADEQUATE

## 2017-06-30 ENCOUNTER — Encounter (HOSPITAL_COMMUNITY): Payer: Self-pay

## 2017-06-30 ENCOUNTER — Other Ambulatory Visit: Payer: Self-pay

## 2017-06-30 ENCOUNTER — Emergency Department (HOSPITAL_COMMUNITY)
Admission: EM | Admit: 2017-06-30 | Discharge: 2017-06-30 | Disposition: A | Discharge status: Home / Self Care | Payer: Medicare Other | Attending: Emergency Medicine | Admitting: Emergency Medicine

## 2017-06-30 ENCOUNTER — Ambulatory Visit (HOSPITAL_COMMUNITY)
Admission: EM | Admit: 2017-06-30 | Discharge: 2017-06-30 | Disposition: A | Discharge status: Home / Self Care | Payer: Medicare Other | Source: Home / Self Care

## 2017-06-30 DIAGNOSIS — Z87891 Personal history of nicotine dependence: Secondary | ICD-10-CM | POA: Insufficient documentation

## 2017-06-30 DIAGNOSIS — I10 Essential (primary) hypertension: Secondary | ICD-10-CM | POA: Insufficient documentation

## 2017-06-30 DIAGNOSIS — Z9049 Acquired absence of other specified parts of digestive tract: Secondary | ICD-10-CM | POA: Diagnosis not present

## 2017-06-30 DIAGNOSIS — Z7982 Long term (current) use of aspirin: Secondary | ICD-10-CM | POA: Insufficient documentation

## 2017-06-30 DIAGNOSIS — Z79899 Other long term (current) drug therapy: Secondary | ICD-10-CM | POA: Insufficient documentation

## 2017-06-30 DIAGNOSIS — F329 Major depressive disorder, single episode, unspecified: Secondary | ICD-10-CM | POA: Insufficient documentation

## 2017-06-30 DIAGNOSIS — I2510 Atherosclerotic heart disease of native coronary artery without angina pectoris: Secondary | ICD-10-CM | POA: Diagnosis not present

## 2017-06-30 DIAGNOSIS — G4751 Confusional arousals: Secondary | ICD-10-CM | POA: Diagnosis present

## 2017-06-30 DIAGNOSIS — R8271 Bacteriuria: Secondary | ICD-10-CM | POA: Diagnosis not present

## 2017-06-30 LAB — URINALYSIS, ROUTINE W REFLEX MICROSCOPIC
Bilirubin Urine: NEGATIVE
Glucose, UA: NEGATIVE mg/dL
Ketones, ur: NEGATIVE mg/dL
NITRITE: NEGATIVE
PH: 5 (ref 5.0–8.0)
Protein, ur: NEGATIVE mg/dL
SPECIFIC GRAVITY, URINE: 1.01 (ref 1.005–1.030)

## 2017-06-30 LAB — CBC
HEMATOCRIT: 45 % (ref 36.0–46.0)
Hemoglobin: 14.6 g/dL (ref 12.0–15.0)
MCH: 30.2 pg (ref 26.0–34.0)
MCHC: 32.4 g/dL (ref 30.0–36.0)
MCV: 93.2 fL (ref 78.0–100.0)
Platelets: 305 10*3/uL (ref 150–400)
RBC: 4.83 MIL/uL (ref 3.87–5.11)
RDW: 13.8 % (ref 11.5–15.5)
WBC: 9.8 10*3/uL (ref 4.0–10.5)

## 2017-06-30 LAB — BASIC METABOLIC PANEL
Anion gap: 13 (ref 5–15)
BUN: 15 mg/dL (ref 6–20)
CO2: 24 mmol/L (ref 22–32)
Calcium: 9.9 mg/dL (ref 8.9–10.3)
Chloride: 98 mmol/L — ABNORMAL LOW (ref 101–111)
Creatinine, Ser: 0.8 mg/dL (ref 0.44–1.00)
GFR calc Af Amer: >60 mL/min (ref >60)
GFR calc non Af Amer: >60 mL/min (ref >60)
GLUCOSE: 105 mg/dL — AB (ref 65–99)
POTASSIUM: 4.4 mmol/L (ref 3.5–5.1)
Sodium: 135 mmol/L (ref 135–145)

## 2017-06-30 MED ORDER — SODIUM CHLORIDE 0.9 % IV BOLUS (SEPSIS)
1000.0000 mL | Freq: Once | INTRAVENOUS | Status: AC
  Administered 2017-06-30: 1000 mL via INTRAVENOUS

## 2017-06-30 NOTE — ED Provider Notes (Signed)
[facility] EMERGENCY DEPARTMENT Provider Note   CSN: [id number] Arrival date & time: 06/30/17  1050     History   Chief Complaint Chief Complaint  Patient presents with  . Recurrent UTI    HPI Krystal Patrick is a 84 y.o. female.  HPI 84 year old female presents to the emergency department with complaints of confusion.  Daughter reports that she has been somewhat confused for the past month.  Daughter believes this is due to recurrent urinary tract infections.  She has been on 2 rounds of antibiotics and reports no significant change in her mental status.  She reports that she is having some auditory and visual hallucinations.  She recently saw a dog running across the floor in the emergency department.  No new medications besides antibiotics.  Patient has been seen by her primary care physician.  Last ER visit in May 30, 2017 had a urine culture performed which demonstrated no evidence of urinary tract infection.  No recent change in her medications otherwise.  No recent head injury.    Past Medical History:  Diagnosis Date  . AAA (abdominal aortic aneurysm) (HCC)   . CAD (coronary artery disease)   . GERD (gastroesophageal reflux disease)   . Hypertension     Patient Active Problem List   Diagnosis Date Noted  . Pressure injury of skin 05/31/2017  . UTI (urinary tract infection) 05/30/2017  . Acute metabolic encephalopathy 05/30/2017  . Hypertensive urgency 05/30/2017  . Depression 05/30/2017  . Hypertension   . CAD (coronary artery disease)   . GERD (gastroesophageal reflux disease)   . AAA (abdominal aortic aneurysm) without rupture (HCC) 09/06/2014    Past Surgical History:  Procedure Laterality Date  . CHOLECYSTECTOMY    . SMALL BOWEL REPAIR      OB History    No data available       Home Medications    Prior to Admission medications   Medication Sig Start Date End Date Taking? Authorizing Provider  acetaminophen (TYLENOL)  650 MG CR tablet Take 650 mg by mouth every 8 (eight) hours as needed for pain.   Yes [provider]  aspirin EC 81 MG tablet Take 81 mg by mouth daily.   Yes [provider]  capsaicin (ARTHRITIS PAIN RELIEF) 0.025 % cream Apply 1 application topically daily.   Yes [provider]  carboxymethylcellul-glycerin ([employer] LUBRICATING/DRY EYE) 0.5-0.9 % ophthalmic solution Place 1 drop into both eyes daily.    Yes [provider]  cholecalciferol (VITAMIN D) 1000 units tablet Take 1,000 Units by mouth daily.   Yes [provider]  citalopram (CELEXA) 10 MG tablet Take 10 mg by mouth 2 (two) times daily. 1 or 2 tablets daily for depression    Yes [provider]  furosemide (LASIX) 20 MG tablet Take 20 mg by mouth 3 (three) times a week. Monday, wednesday, and friday   Yes [provider]  lisinopril (PRINIVIL,ZESTRIL) 40 MG tablet Take 40 mg by mouth daily.   Yes [provider]  metoprolol succinate (TOPROL-XL) 25 MG 24 hr tablet Take 25 mg by mouth 2 (two) times daily.    Yes [provider]  nitrofurantoin, macrocrystal-monohydrate, (MACROBID) 100 MG capsule Take 100 mg by mouth 2 (two) times daily. 06/17/17  Yes [provider]  traMADol (ULTRAM) 50 MG tablet Take 50 mg by mouth at bedtime.   Yes [provider]  vitamin B-12 (CYANOCOBALAMIN) 500 MCG tablet Take 500 mcg by mouth  daily.   Yes [provider]  Cholecalciferol (VITAMIN D3) 10000 units TABS Take 1,000 Units by mouth daily.    [provider]  diclofenac sodium (VOLTAREN) 1 % GEL Apply 2 g topically 3 (three) times daily. Patient not taking: Reported on 06/30/2017 06/03/17   Darlin Drop, DO    Family History Family History  Problem Relation Age of Onset  . Heart disease Father   . Heart attack Father   . Diabetes Daughter   . Hypertension Daughter     Social History Social History   Tobacco Use  . Smoking  status: Former Smoker    Types: Cigarettes    Last attempt to quit: 09/05/1989    Years since quitting: 27.8  . Smokeless tobacco: Never Used  Substance Use Topics  . Alcohol use: No    Alcohol/week: 0.0 oz  . Drug use: No     Allergies   Penicillins   Review of Systems Review of Systems  All other systems reviewed and are negative.    Physical Exam Updated Vital Signs BP (!) 206/70 (BP Location: Left Arm)   Pulse (!) 57   Temp 97.7 F (36.5 C) (Oral)   Resp 20   Wt 59.9 kg (132 lb)   SpO2 95%   BMI 23.38 kg/m   Physical Exam  Constitutional: She is oriented to person, place, and time. She appears well-developed and well-nourished. No distress.  HENT:  Head: Normocephalic and atraumatic.  Eyes: EOM are normal.  Neck: Normal range of motion.  Cardiovascular: Normal rate, regular rhythm and normal heart sounds.  Pulmonary/Chest: Effort normal and breath sounds normal.  Abdominal: Soft. She exhibits no distension. There is no tenderness.  Musculoskeletal: Normal range of motion.  Neurological: She is alert and oriented to person, place, and time.  Skin: Skin is warm and dry.  Psychiatric: She has a normal mood and affect. Judgment normal.  Nursing note and vitals reviewed.    ED Treatments / Results  Labs (all labs ordered are listed, but only abnormal results are displayed) Labs Reviewed  URINALYSIS, ROUTINE W REFLEX MICROSCOPIC - Abnormal; Notable for the following components:      Result Value   APPearance CLOUDY (*)    Hgb urine dipstick SMALL (*)    Leukocytes, UA LARGE (*)    Bacteria, UA FEW (*)    Squamous Epithelial / LPF TOO NUMEROUS TO COUNT (*)    Non Squamous Epithelial 0-5 (*)    All other components within normal limits  URINE CULTURE  CBC  BASIC METABOLIC PANEL    EKG  EKG Interpretation None       Radiology No results found.  Procedures Procedures (including critical care time)  Medications Ordered in ED Medications    sodium chloride 0.9 % bolus 1,000 mL (1,000 mLs Intravenous New Bag/Given 06/30/17 1634)     Initial Impression / Assessment and Plan / ED Course  I have reviewed the triage vital signs and the nursing notes.  Pertinent labs & imaging results that were available during my care of the patient were reviewed by me and considered in my medical decision making (see chart for details).     Patient is overall well-appearing.  She has normal neurologic exam.  She has no focal deficits.  Given her advanced age I suspect much of this is more mild dementia with behavioral disturbance.  She will need to be seen and evaluated by a neurologist will mental examination.  She also  may benefit from neuro psychiatric care.  Her vital signs are stable.  She is oriented she has no focus complaints at this time.  She is well dressed.  She has makeup on.  Her hair is combed.  She can tell me a reasonable history and provide me with recent events within her life.  If her labs demonstrate no significant abnormality today I think she can be safely discharged home for close primary care follow-up and ongoing outpatient workup of her hallucinations.  I am not convinced this is a true urinary tract infection.  Urine cultures been sent.  I do not think she needs antibiotics at this time if her urine culture grows out a positive infection she will be contacted and antibiotics can be prescribed at that time.  Final Clinical Impressions(s) / ED Diagnoses   Final diagnoses:  None    ED Discharge Orders    None       Azalia Bilisampos, Emoni Yang, MD 06/30/17 [phone]

## 2017-06-30 NOTE — Discharge Instructions (Signed)
These call your doctor this week, you should be seen within the next 48 hours.  Your urine culture results will be back within 48 hours and your doctor needs to follow-up on those results.  Please return to the emergency department for the following symptoms Pain, nausea, shortness of breath Weakness, difficulty urinating, diarrhea

## 2017-06-30 NOTE — ED Notes (Signed)
edp aware of bp  

## 2017-06-30 NOTE — ED Triage Notes (Signed)
Pt presents to the ed with complaints of possible uti. The patient was just diagnosed with a uti a month ago and is just getting off antibiotics. Per the daughter she is acting like she did prior to starting the antibiotics with confusion and frequent urination.

## 2017-06-30 NOTE — ED Provider Notes (Signed)
Received at change of shift pending labs Labs reviewed - no acute findings uA with bacteria - hx of same without + culture Family informed to f/u the culture results with PCP Will see in next 2 days Stable for d/c - patient is eating and tolerating it with normal mental status - awake and interactive and well appearing.     Eber HongMiller, Chrsitopher Wik, MD 06/30/17 [phone]

## 2017-07-02 LAB — URINE CULTURE

## 2018-01-08 ENCOUNTER — Emergency Department (HOSPITAL_COMMUNITY): Payer: Medicare Other

## 2018-01-08 ENCOUNTER — Inpatient Hospital Stay (HOSPITAL_COMMUNITY)
Admission: EM | Admit: 2018-01-08 | Discharge: 2018-01-12 | DRG: 641 | Disposition: A | Discharge status: Skilled Nursing Facility | Payer: Medicare Other | Attending: Family Medicine | Admitting: Family Medicine

## 2018-01-08 ENCOUNTER — Encounter (HOSPITAL_COMMUNITY): Payer: Self-pay | Admitting: *Deleted

## 2018-01-08 DIAGNOSIS — M545 Low back pain, unspecified: Secondary | ICD-10-CM

## 2018-01-08 DIAGNOSIS — R55 Syncope and collapse: Secondary | ICD-10-CM | POA: Diagnosis not present

## 2018-01-08 DIAGNOSIS — N179 Acute kidney failure, unspecified: Secondary | ICD-10-CM | POA: Diagnosis present

## 2018-01-08 DIAGNOSIS — M810 Age-related osteoporosis without current pathological fracture: Secondary | ICD-10-CM | POA: Diagnosis present

## 2018-01-08 DIAGNOSIS — I2510 Atherosclerotic heart disease of native coronary artery without angina pectoris: Secondary | ICD-10-CM | POA: Diagnosis present

## 2018-01-08 DIAGNOSIS — Z23 Encounter for immunization: Secondary | ICD-10-CM

## 2018-01-08 DIAGNOSIS — R0902 Hypoxemia: Secondary | ICD-10-CM | POA: Diagnosis present

## 2018-01-08 DIAGNOSIS — Z833 Family history of diabetes mellitus: Secondary | ICD-10-CM

## 2018-01-08 DIAGNOSIS — I4581 Long QT syndrome: Secondary | ICD-10-CM | POA: Diagnosis present

## 2018-01-08 DIAGNOSIS — F329 Major depressive disorder, single episode, unspecified: Secondary | ICD-10-CM | POA: Diagnosis present

## 2018-01-08 DIAGNOSIS — I714 Abdominal aortic aneurysm, without rupture, unspecified: Secondary | ICD-10-CM | POA: Diagnosis present

## 2018-01-08 DIAGNOSIS — W19XXXA Unspecified fall, initial encounter: Secondary | ICD-10-CM

## 2018-01-08 DIAGNOSIS — I10 Essential (primary) hypertension: Secondary | ICD-10-CM | POA: Diagnosis present

## 2018-01-08 DIAGNOSIS — J439 Emphysema, unspecified: Secondary | ICD-10-CM | POA: Diagnosis present

## 2018-01-08 DIAGNOSIS — Z87891 Personal history of nicotine dependence: Secondary | ICD-10-CM

## 2018-01-08 DIAGNOSIS — Z7982 Long term (current) use of aspirin: Secondary | ICD-10-CM

## 2018-01-08 DIAGNOSIS — E860 Dehydration: Principal | ICD-10-CM | POA: Diagnosis present

## 2018-01-08 DIAGNOSIS — Z9049 Acquired absence of other specified parts of digestive tract: Secondary | ICD-10-CM

## 2018-01-08 DIAGNOSIS — K219 Gastro-esophageal reflux disease without esophagitis: Secondary | ICD-10-CM | POA: Diagnosis present

## 2018-01-08 DIAGNOSIS — Z8249 Family history of ischemic heart disease and other diseases of the circulatory system: Secondary | ICD-10-CM

## 2018-01-08 DIAGNOSIS — Y92009 Unspecified place in unspecified non-institutional (private) residence as the place of occurrence of the external cause: Secondary | ICD-10-CM

## 2018-01-08 DIAGNOSIS — Z880 Allergy status to penicillin: Secondary | ICD-10-CM

## 2018-01-08 LAB — URINALYSIS, ROUTINE W REFLEX MICROSCOPIC
Bilirubin Urine: NEGATIVE
GLUCOSE, UA: NEGATIVE mg/dL
Hgb urine dipstick: NEGATIVE
Ketones, ur: NEGATIVE mg/dL
LEUKOCYTES UA: NEGATIVE
Nitrite: NEGATIVE
PH: 5 (ref 5.0–8.0)
Protein, ur: NEGATIVE mg/dL
SPECIFIC GRAVITY, URINE: 1.011 (ref 1.005–1.030)

## 2018-01-08 LAB — BASIC METABOLIC PANEL
Anion gap: 13 (ref 5–15)
BUN: 24 mg/dL — ABNORMAL HIGH (ref 8–23)
CO2: 26 mmol/L (ref 22–32)
Calcium: 9.5 mg/dL (ref 8.9–10.3)
Chloride: 100 mmol/L (ref 98–111)
Creatinine, Ser: 1.12 mg/dL — ABNORMAL HIGH (ref 0.44–1.00)
GFR calc Af Amer: 47 mL/min — ABNORMAL LOW (ref >60)
GFR calc non Af Amer: 41 mL/min — ABNORMAL LOW (ref >60)
GLUCOSE: 127 mg/dL — AB (ref 70–99)
Potassium: 4.8 mmol/L (ref 3.5–5.1)
SODIUM: 139 mmol/L (ref 135–145)

## 2018-01-08 LAB — CBC
HCT: 45.4 % (ref 36.0–46.0)
Hemoglobin: 14.9 g/dL (ref 12.0–15.0)
MCH: 30.2 pg (ref 26.0–34.0)
MCHC: 32.8 g/dL (ref 30.0–36.0)
MCV: 91.9 fL (ref 78.0–100.0)
PLATELETS: 340 10*3/uL (ref 150–400)
RBC: 4.94 MIL/uL (ref 3.87–5.11)
RDW: 13.5 % (ref 11.5–15.5)
WBC: 14.7 10*3/uL — ABNORMAL HIGH (ref 4.0–10.5)

## 2018-01-08 LAB — TROPONIN I: Troponin I: <0.03 ng/mL (ref <0.03)

## 2018-01-08 LAB — BRAIN NATRIURETIC PEPTIDE: B Natriuretic Peptide: 109.6 pg/mL — ABNORMAL HIGH (ref 0.0–100.0)

## 2018-01-08 MED ORDER — MORPHINE SULFATE (PF) 4 MG/ML IV SOLN
4.0000 mg | INTRAVENOUS | Status: AC | PRN
  Administered 2018-01-08: 4 mg via INTRAVENOUS
  Filled 2018-01-08: qty 1

## 2018-01-08 MED ORDER — SODIUM CHLORIDE 0.9 % IV SOLN
1000.0000 mL | INTRAVENOUS | Status: DC
  Administered 2018-01-08: 1000 mL via INTRAVENOUS

## 2018-01-08 MED ORDER — IOPAMIDOL (ISOVUE-370) INJECTION 76%
INTRAVENOUS | Status: AC
  Filled 2018-01-08: qty 100

## 2018-01-08 MED ORDER — IOPAMIDOL (ISOVUE-370) INJECTION 76%
80.0000 mL | Freq: Once | INTRAVENOUS | Status: AC | PRN
  Administered 2018-01-08: 80 mL via INTRAVENOUS

## 2018-01-08 MED ORDER — SODIUM CHLORIDE 0.9 % IV BOLUS (SEPSIS)
500.0000 mL | Freq: Once | INTRAVENOUS | Status: AC
  Administered 2018-01-08: 500 mL via INTRAVENOUS

## 2018-01-08 MED ORDER — INFLUENZA VAC SPLIT HIGH-DOSE 0.5 ML IM SUSY
0.5000 mL | PREFILLED_SYRINGE | INTRAMUSCULAR | Status: AC
  Administered 2018-01-09: 0.5 mL via INTRAMUSCULAR
  Filled 2018-01-08: qty 0.5

## 2018-01-08 MED ORDER — MORPHINE SULFATE (PF) 2 MG/ML IV SOLN
2.0000 mg | INTRAVENOUS | Status: DC | PRN
  Administered 2018-01-08: 2 mg via INTRAVENOUS
  Filled 2018-01-08 (×2): qty 1

## 2018-01-08 NOTE — ED Notes (Signed)
Bed: [facility] Expected date:  Expected time:  Means of arrival:  Comments: EMS-fall 

## 2018-01-08 NOTE — ED Notes (Signed)
Patient transported to X-ray 

## 2018-01-08 NOTE — ED Notes (Addendum)
MD requested pt be ambulated to check pts ability to walk.  When on 2L O2 in bed, pt oxygen sat was 96.  93 on RA while sitting on edge of bed.  Pt asked to stand x2 assistance and walker and take a few steps.  Pt proceeded to desat on RA to 79% using portable pulse ox. Pt placed back on 2L nasal cannula.  MD notified.

## 2018-01-08 NOTE — ED Notes (Signed)
Patient transported to CT 

## 2018-01-08 NOTE — H&P (Signed)
History and Physical    Krystal Patrick ZOX:[id number] DOB: Mar 23, 1921 DOA: 01/08/2018  PCP: Kaleen Mask, MD   Patient coming from: Home  Chief Complaint: Fall, unresponsiveness  HPI: Krystal Patrick is a 84 y.o. female with medical history significant for AAA, HTN, CAD, depression.  Patient reports a fall 5 days ago on Saturday 01/03/18.  She reports she fell while she was trying to sit on the seat of her walker which had a pillow on it.  She usually does not put a pillow, did not have the brakes on on the walker, the pillow slid off and she fell on the floor. She was on the floor for about 45 minutes, after which her nephew who she had called came help her up.  She reports pain in her sacral area since then, she was initially able to bear weight but this morning she could not bear weight or get up.  She reports chronic unchanged back pain.  She reports mild intermittent dizziness, denies chest pain or shortness of breath prior to or after the fall, denies vomiting loose stools, and has maintained good p.o. Intake.  ED Course: In the ED, initially O2 sats noted to be mildly low ~91%, on attempt at ambulating patient in the ED patient became weak and lightheaded and had a near syncopal episode, she had to be held and put back on the bed.  Episode lasted about a few seconds.  Lumbar spine sacral/coccyx pelvic and two-view chest x-ray all negative for acute abnormality or definite fractures.  But radiologist recommended pelvic MRI if clinical concern persists, as sensitivity for nondisplaced fractures is significantly limited by underlying osteopenia.  I-STAT troponin negative.  Creatinine 1.12 with near syncopal episode and borderline hypoxia, CT angio chest was done to rule out PE.   Review of Systems: As per HPI all other systems reviewed and negative.  Past Medical History:  Diagnosis Date  . AAA (abdominal aortic aneurysm) (HCC)   . CAD (coronary artery disease)   . GERD  (gastroesophageal reflux disease)   . Hypertension     Past Surgical History:  Procedure Laterality Date  . CHOLECYSTECTOMY    . SMALL BOWEL REPAIR       reports that she quit smoking about 28 years ago. Her smoking use included cigarettes. She has never used smokeless tobacco. She reports that she does not drink alcohol or use drugs.  Allergies  Allergen Reactions  . Penicillins Rash    Has patient had a PCN reaction causing immediate rash, facial/tongue/throat swelling, SOB or lightheadedness with hypotension: Unknown Has patient had a PCN reaction causing severe rash involving mucus membranes or skin necrosis: Unknown Has patient had a PCN reaction that required hospitalization: No Has patient had a PCN reaction occurring within the last 10 years: No If all of the above answers are "NO", then may proceed with Cephalosporin use.     Family History  Problem Relation Age of Onset  . Heart disease Father   . Heart attack Father   . Diabetes Daughter   . Hypertension Daughter     Prior to Admission medications   Medication Sig Start Date End Date Taking? Authorizing Provider  acetaminophen (TYLENOL) 650 MG CR tablet Take 650 mg by mouth every 8 (eight) hours as needed for pain.   Yes [provider]  amLODipine (NORVASC) 5 MG tablet Take 10 mg by mouth daily.   Yes [provider]  aspirin EC 81 MG tablet Take 81 mg by  mouth daily.   Yes [provider]  capsaicin (ARTHRITIS PAIN RELIEF) 0.025 % cream Apply 1 application topically daily.   Yes [provider]  Cholecalciferol (VITAMIN D3) 10000 units TABS Take 1,000 Units by mouth daily.   Yes [provider]  citalopram (CELEXA) 10 MG tablet Take 10 mg by mouth daily. 1 or 2 tablets daily for depression    Yes [provider]  doxycycline (VIBRA-TABS) 100 MG tablet Take 100 mg by mouth daily.   Yes [provider]  furosemide (LASIX) 20 MG tablet Take 20 mg by mouth  daily. Monday, wednesday, and friday   Yes [provider]  lisinopril (PRINIVIL,ZESTRIL) 40 MG tablet Take 40 mg by mouth daily.   Yes [provider]  Melatonin 5 MG TABS Take 5 mg by mouth at bedtime as needed (sleep).   Yes [provider]  metoprolol tartrate (LOPRESSOR) 25 MG tablet Take 25 mg by mouth 2 (two) times daily.   Yes [provider]  traMADol (ULTRAM) 50 MG tablet Take 25 mg by mouth every 12 (twelve) hours as needed for moderate pain.    Yes [provider]  vitamin B-12 (CYANOCOBALAMIN) 500 MCG tablet Take 500 mcg by mouth daily.   Yes [provider]  diclofenac sodium (VOLTAREN) 1 % GEL Apply 2 g topically 3 (three) times daily. Patient not taking: Reported on 01/08/2018 06/03/17   Darlin Drop, DO    Physical Exam: Vitals:   01/08/18 2015 01/08/18 2030 01/08/18 2100 01/08/18 2145  BP: 133/72  125/81 (!) 147/72  Pulse: 74 69 75 81  Resp: 17 17 14 20   Temp:      TempSrc:      SpO2: 98% 96% 96% 91%    Constitutional: NAD, calm, comfortable Vitals:   01/08/18 2015 01/08/18 2030 01/08/18 2100 01/08/18 2145  BP: 133/72  125/81 (!) 147/72  Pulse: 74 69 75 81  Resp: 17 17 14 20   Temp:      TempSrc:      SpO2: 98% 96% 96% 91%   Eyes: PERRL, lids and conjunctivae normal ENMT: Mucous membranes are moist. Posterior pharynx clear of any exudate or lesions. Neck: normal, supple, no masses, no thyromegaly Respiratory: clear to auscultation bilaterally, no wheezing, no crackles. Normal respiratory effort. No accessory muscle use.  Cardiovascular: Regular rate and rhythm, no murmurs / rubs / gallops. No extremity edema. 2+ pedal pulses.  Abdomen: no tenderness, no masses palpated. No hepatosplenomegaly. Bowel sounds positive.  Musculoskeletal: no clubbing / cyanosis. No joint deformity upper and lower extremities. Good ROM, no contractures. Normal muscle tone.  Skin: no rashes, lesions, ulcers. No induration Neurologic:  CN 2-12 grossly intact. Sensation intact, DTR normal. Strength 4/5 in all 4.  Psychiatric: Normal judgment and insight. Alert and oriented x 3. Normal mood.   Labs on Admission: I have personally reviewed following labs and imaging studies  CBC: Recent Labs  Lab 01/08/18 1635  WBC 14.7*  HGB 14.9  HCT 45.4  MCV 91.9  PLT 340   Basic Metabolic Panel: Recent Labs  Lab 01/08/18 1635  NA 139  K 4.8  CL 100  CO2 26  GLUCOSE 127*  BUN 24*  CREATININE 1.12*  CALCIUM 9.5   Cardiac Enzymes: Recent Labs  Lab 01/08/18 1941  TROPONINI <0.03   Urine analysis:    Component Value Date/Time   COLORURINE YELLOW 01/08/2018 2021   APPEARANCEUR CLEAR 01/08/2018 2021   LABSPEC 1.011 01/08/2018 2021  PHURINE 5.0 01/08/2018 2021   GLUCOSEU NEGATIVE 01/08/2018 2021   HGBUR NEGATIVE 01/08/2018 2021   BILIRUBINUR NEGATIVE 01/08/2018 2021   KETONESUR NEGATIVE 01/08/2018 2021   PROTEINUR NEGATIVE 01/08/2018 2021   NITRITE NEGATIVE 01/08/2018 2021   LEUKOCYTESUR NEGATIVE 01/08/2018 2021    Radiological Exams on Admission: Dg Chest 2 View  Result Date: 01/08/2018 CLINICAL DATA:  Weakness. EXAM: CHEST - 2 VIEW COMPARISON:  April 08, 2016 FINDINGS: The heart size and mediastinal contours are stable. The heart size is enlarged. The aorta is tortuous. There is no focal infiltrate, pulmonary edema, or pleural effusion. Calcified granulomas are identified bilaterally. The visualized skeletal structures are stable. IMPRESSION: No active cardiopulmonary disease. Electronically Signed   By: Sherian Rein M.D.   On: 01/08/2018 19:46   Dg Lumbar Spine Complete  Result Date: 01/08/2018 CLINICAL DATA:  84 year old female with progressive pain with ambulation after suffering a fall last Saturday. EXAM: LUMBAR SPINE - COMPLETE 4+ VIEW COMPARISON:  Prior radiographs of the lumbar spine 06/02/2017 FINDINGS: Enlarging abdominal aortic aneurysm now measuring up to 4.1 cm compared to 3.9 cm previously. No  evidence of acute fracture or malalignment. The vertebral body heights are maintained. Stable degenerative disc disease and lower lumbar facet arthropathy. Mild grade 1 anterolisthesis of L4 on L5 is similar compared to prior. Surgical clips in the right upper quadrant consistent with prior cholecystectomy. The bones are diffusely demineralized. IMPRESSION: 1. No acute fracture or malalignment. 2. Slight interval enlargement in abdominal aortic aneurysm now measuring up to 4.1 cm compared to 3.9 cm in January of 2019. 3. Lower lumbar degenerative disc disease and facet arthropathy. 4. Diffuse demineralization. Electronically Signed   By: Malachy Moan M.D.   On: 01/08/2018 18:03   Dg Pelvis 1-2 Views  Result Date: 01/08/2018 CLINICAL DATA:  84 year old female with progressive pain during ambulation since suffering a fall last Saturday. EXAM: PELVIS - 1-2 VIEW COMPARISON:  Concurrently obtained radiographs of the lumbar spine and sacrum; prior pelvic radiographs 06/02/2017 FINDINGS: There is no evidence of pelvic fracture or diastasis. No pelvic bone lesions are seen. The bones are diffusely demineralized. Atherosclerotic calcifications noted in the bilateral femoral arteries. IMPRESSION: No definite acute fracture identified. Please note that evaluation for nondisplaced fractures is limited by the underlying osteopenia. If clinical concern persists, further imaging with MRI would be recommended as CT scan would also likely be insensitive. Electronically Signed   By: Malachy Moan M.D.   On: 01/08/2018 18:07   Dg Sacrum/coccyx  Result Date: 01/08/2018 CLINICAL DATA:  84 year old female with progressive lower back pain with ambulation since sustaining a fall last Saturday EXAM: SACRUM AND COCCYX - 2+ VIEW COMPARISON:  Concurrently obtained radiographs of the lumbar spine and pelvis; prior radiographs of the pelvis 06/02/2017 FINDINGS: The bones are diffusely demineralized. No evidence of acute fracture  or malalignment. Lower lumbar degenerative disc disease and facet arthropathy. IMPRESSION: No definite fracture identified although sensitivity for nondisplaced fractures is significantly limited by underlying osteopenia. If clinical concern persists, recommend further evaluation with pelvic MRI. Pelvic CT scan will likely be insufficiently sensitive due to the underlying osteopenia. Electronically Signed   By: Malachy Moan M.D.   On: 01/08/2018 18:05   EKG: Independently reviewed.  Prolonged QTC 562.  Sinus rhythm.  EKG similar to prior  Assessment/Plan Principal Problem:   Near syncope Active Problems:   AAA (abdominal aortic aneurysm) without rupture (HCC)   Hypertension   CAD (coronary artery disease)   Fall at home,  initial encounter   Near syncope-witnessed in the ED. Lasted a few seconds so had to call it true syncopal event.  No respiratory or cardiac symptoms.  Troponin X 1, CBC unremarkable.  EKG max prolonged QTC- 562, otherwise unchanged from.  CTA done for borderline hypoxia and syncope-negative for PE, aortic dissection or infiltrates, shows mild compression deformity T2. At the time of my evaluation O2 sats durations down to 88% on room air. ? Vasovagal episode related to pain causing near syncope. - Echocardiogram - Trend trops X 2. -Orthostatic vitals  Mild Hypoxia-no cough, denies dyspnea, no wheeze.  Down to 88% on room air on my evaluation. CTA chest -no acute abnormality.  At this time etiology unknown.  Quit smoking cigarettes 30 years ago.  No prior pulmonary disease diagnosis -Recheck in a.m.  Fall-mechanical.  Pelvic, sacral/coccyx and lumbar x-ray all negative for fracture. -PT evaluation -If unable to ambulate consider pelvic MRI following radiologist recommendations. -Pain control  ?Mild AKI-creatinine 1.12 baseline 0.7-0.9. -Hydrate n/s 75 cc/hr X 12 hrs - BMP a.m -Hold lisinopril  Prolonged QTC- 562. -Repeat EKG a.m - tele monitoring -Hold  citalopram - Check MAgnesium  HTN-stable,  - Home medications Norvasc and lisinopril held, with near syncopal episode on standing, continue metoprolol 25 twice daily  CAD-stable, no chest pain,  - continue aspirin Lipitor  DVT prophylaxis: heparin Code Status: full Family Communication: None at bedside  disposition Plan: per rounding team Consults called: None Admission status: obs tele   Onnie Boer MD [facility] Pager [phone] From 6PM-2AM.  Otherwise please contact night-coverage [email or web address] Password [email or web address]  01/08/2018, 10:13 PM

## 2018-01-08 NOTE — ED Provider Notes (Signed)
[facility]-EMERGENCY DEPT Provider Note   CSN: [id number] Arrival date & time: 01/08/18  1521     History   Chief Complaint Chief Complaint  Patient presents with  . Fall    HPI Krystal Patrick is a 84 y.o. female.  HPI Patient presents to the emergency room for evaluation of lower back and buttock pain.  Patient states she had a fall at home on Saturday.  Patient uses a rolling walker when she lost her balance and fell backward onto her bottom.  Patient was not able to stand up on her own.  Family members were able to assist her.  Patient states she was initially able to walk okay but starting yesterday she had pain in her bilateral buttocks to her thighs.  Patient states the pain gets worse when she is moving around but otherwise she is not having any significant pain.  She denies any numbness or weakness.  No complaints of fevers or chills.  No abdominal pain or other concerns. Past Medical History:  Diagnosis Date  . AAA (abdominal aortic aneurysm) (HCC)   . CAD (coronary artery disease)   . GERD (gastroesophageal reflux disease)   . Hypertension     Patient Active Problem List   Diagnosis Date Noted  . Pressure injury of skin 05/31/2017  . UTI (urinary tract infection) 05/30/2017  . Acute metabolic encephalopathy 05/30/2017  . Hypertensive urgency 05/30/2017  . Depression 05/30/2017  . Hypertension   . CAD (coronary artery disease)   . GERD (gastroesophageal reflux disease)   . AAA (abdominal aortic aneurysm) without rupture (HCC) 09/06/2014    Past Surgical History:  Procedure Laterality Date  . CHOLECYSTECTOMY    . SMALL BOWEL REPAIR       OB History   None      Home Medications    Prior to Admission medications   Medication Sig Start Date End Date Taking? Authorizing Provider  acetaminophen (TYLENOL) 650 MG CR tablet Take 650 mg by mouth every 8 (eight) hours as needed for pain.   Yes [provider]  amLODipine  (NORVASC) 5 MG tablet Take 10 mg by mouth daily.   Yes [provider]  aspirin EC 81 MG tablet Take 81 mg by mouth daily.   Yes [provider]  capsaicin (ARTHRITIS PAIN RELIEF) 0.025 % cream Apply 1 application topically daily.   Yes [provider]  Cholecalciferol (VITAMIN D3) 10000 units TABS Take 1,000 Units by mouth daily.   Yes [provider]  citalopram (CELEXA) 10 MG tablet Take 10 mg by mouth daily. 1 or 2 tablets daily for depression    Yes [provider]  doxycycline (VIBRA-TABS) 100 MG tablet Take 100 mg by mouth daily.   Yes [provider]  furosemide (LASIX) 20 MG tablet Take 20 mg by mouth daily. Monday, wednesday, and friday   Yes [provider]  lisinopril (PRINIVIL,ZESTRIL) 40 MG tablet Take 40 mg by mouth daily.   Yes [provider]  Melatonin 5 MG TABS Take 5 mg by mouth at bedtime as needed (sleep).   Yes [provider]  metoprolol tartrate (LOPRESSOR) 25 MG tablet Take 25 mg by mouth 2 (two) times daily.   Yes [provider]  traMADol (ULTRAM) 50 MG tablet Take 25 mg by mouth every 12 (twelve) hours as needed for moderate pain.    Yes [provider]  vitamin B-12 (CYANOCOBALAMIN) 500 MCG tablet Take 500 mcg by mouth daily.  Yes [provider]  diclofenac sodium (VOLTAREN) 1 % GEL Apply 2 g topically 3 (three) times daily. Patient not taking: Reported on 01/08/2018 06/03/17   Darlin Drop, DO    Family History Family History  Problem Relation Age of Onset  . Heart disease Father   . Heart attack Father   . Diabetes Daughter   . Hypertension Daughter     Social History Social History   Tobacco Use  . Smoking status: Former Smoker    Types: Cigarettes    Last attempt to quit: 09/05/1989    Years since quitting: 28.3  . Smokeless tobacco: Never Used  Substance Use Topics  . Alcohol use: No    Alcohol/week: 0.0 standard drinks  . Drug use: No      Allergies   Penicillins   Review of Systems Review of Systems  All other systems reviewed and are negative.    Physical Exam Updated Vital Signs BP 133/72   Pulse 69   Temp 98.8 F (37.1 C) (Oral)   Resp 17   SpO2 96%   Physical Exam  Constitutional: No distress.  Elderly, frail  HENT:  Head: Normocephalic and atraumatic.  Right Ear: External ear normal.  Left Ear: External ear normal.  Eyes: Conjunctivae are normal. Right eye exhibits no discharge. Left eye exhibits no discharge. No scleral icterus.  Neck: Neck supple. No tracheal deviation present.  Cardiovascular: Normal rate, regular rhythm and intact distal pulses.  Pulmonary/Chest: Effort normal and breath sounds normal. No stridor. No respiratory distress. She has no wheezes. She has no rales.  Abdominal: Soft. Bowel sounds are normal. She exhibits no distension. There is no tenderness. There is no rebound and no guarding.  Musculoskeletal: She exhibits no edema.       Lumbar back: She exhibits tenderness. She exhibits no swelling, no edema and no deformity.  ttp bilateral buttocks, sacrum area; no erythema, no skin breakdown  Neurological: She is alert. No cranial nerve deficit (no facial droop, extraocular movements intact, no slurred speech) or sensory deficit. She exhibits normal muscle tone. She displays no seizure activity. Coordination normal.  Normal grip strength bilaterally, patient is able to lift both legs off the bed, normal sensation bilateral lower extremities  Skin: Skin is warm and dry. No rash noted.  Psychiatric: She has a normal mood and affect.  Nursing note and vitals reviewed.    ED Treatments / Results  Labs (all labs ordered are listed, but only abnormal results are displayed) Labs Reviewed  CBC - Abnormal; Notable for the following components:      Result Value   WBC 14.7 (*)    All other components within normal limits  BASIC METABOLIC PANEL - Abnormal; Notable for the  following components:   Glucose, Bld 127 (*)    BUN 24 (*)    Creatinine, Ser 1.12 (*)    GFR calc non Af Amer 41 (*)    GFR calc Af Amer 47 (*)    All other components within normal limits  BRAIN NATRIURETIC PEPTIDE - Abnormal; Notable for the following components:   B Natriuretic Peptide 109.6 (*)    All other components within normal limits  URINALYSIS, ROUTINE W REFLEX MICROSCOPIC  TROPONIN I    EKG EKG Interpretation  Date/Time:  Thursday January 08 2018 19:36:14 EDT Ventricular Rate:  70 PR Interval:    QRS Duration: 91 QT Interval:  520 QTC Calculation: 562 R Axis:   2 Text Interpretation:  Sinus  rhythm Abnormal R-wave progression, early transition Borderline repolarization abnormality Prolonged QT interval No significant change since last tracing Confirmed by Linwood Dibbles [phone]) on 01/08/2018 7:56:29 PM   Radiology Dg Chest 2 View  Result Date: 01/08/2018 CLINICAL DATA:  Weakness. EXAM: CHEST - 2 VIEW COMPARISON:  April 08, 2016 FINDINGS: The heart size and mediastinal contours are stable. The heart size is enlarged. The aorta is tortuous. There is no focal infiltrate, pulmonary edema, or pleural effusion. Calcified granulomas are identified bilaterally. The visualized skeletal structures are stable. IMPRESSION: No active cardiopulmonary disease. Electronically Signed   By: Sherian Rein M.D.   On: 01/08/2018 19:46   Dg Lumbar Spine Complete  Result Date: 01/08/2018 CLINICAL DATA:  84 year old female with progressive pain with ambulation after suffering a fall last Saturday. EXAM: LUMBAR SPINE - COMPLETE 4+ VIEW COMPARISON:  Prior radiographs of the lumbar spine 06/02/2017 FINDINGS: Enlarging abdominal aortic aneurysm now measuring up to 4.1 cm compared to 3.9 cm previously. No evidence of acute fracture or malalignment. The vertebral body heights are maintained. Stable degenerative disc disease and lower lumbar facet arthropathy. Mild grade 1 anterolisthesis of L4 on L5 is  similar compared to prior. Surgical clips in the right upper quadrant consistent with prior cholecystectomy. The bones are diffusely demineralized. IMPRESSION: 1. No acute fracture or malalignment. 2. Slight interval enlargement in abdominal aortic aneurysm now measuring up to 4.1 cm compared to 3.9 cm in January of 2019. 3. Lower lumbar degenerative disc disease and facet arthropathy. 4. Diffuse demineralization. Electronically Signed   By: Malachy Moan M.D.   On: 01/08/2018 18:03   Dg Pelvis 1-2 Views  Result Date: 01/08/2018 CLINICAL DATA:  84 year old female with progressive pain during ambulation since suffering a fall last Saturday. EXAM: PELVIS - 1-2 VIEW COMPARISON:  Concurrently obtained radiographs of the lumbar spine and sacrum; prior pelvic radiographs 06/02/2017 FINDINGS: There is no evidence of pelvic fracture or diastasis. No pelvic bone lesions are seen. The bones are diffusely demineralized. Atherosclerotic calcifications noted in the bilateral femoral arteries. IMPRESSION: No definite acute fracture identified. Please note that evaluation for nondisplaced fractures is limited by the underlying osteopenia. If clinical concern persists, further imaging with MRI would be recommended as CT scan would also likely be insensitive. Electronically Signed   By: Malachy Moan M.D.   On: 01/08/2018 18:07   Dg Sacrum/coccyx  Result Date: 01/08/2018 CLINICAL DATA:  84 year old female with progressive lower back pain with ambulation since sustaining a fall last Saturday EXAM: SACRUM AND COCCYX - 2+ VIEW COMPARISON:  Concurrently obtained radiographs of the lumbar spine and pelvis; prior radiographs of the pelvis 06/02/2017 FINDINGS: The bones are diffusely demineralized. No evidence of acute fracture or malalignment. Lower lumbar degenerative disc disease and facet arthropathy. IMPRESSION: No definite fracture identified although sensitivity for nondisplaced fractures is significantly limited by  underlying osteopenia. If clinical concern persists, recommend further evaluation with pelvic MRI. Pelvic CT scan will likely be insufficiently sensitive due to the underlying osteopenia. Electronically Signed   By: Malachy Moan M.D.   On: 01/08/2018 18:05    Procedures Procedures (including critical care time)  Medications Ordered in ED Medications  morphine 2 MG/ML injection 2 mg (2 mg Intravenous Given 01/08/18 1633)  sodium chloride 0.9 % bolus 500 mL (has no administration in time range)    Followed by  0.9 %  sodium chloride infusion (has no administration in time range)     Initial Impression / Assessment and Plan / ED Course  I have reviewed the triage vital signs and the nursing notes.  Pertinent labs & imaging results that were available during my care of the patient were reviewed by me and considered in my medical decision making (see chart for details).  Clinical Course as of Jan 09 2104  Thu Jan 08, 2018  1853 x-rays reviewed.  No acute fracture noted.  Discussed findings with family.  Cannot exclude an occult fracture.   [JK]  1853 Patient's labs are notable for an elevated white blood cell count and some findings consistent with mild renal insufficiency.   [JK]  [id number] Patient has not been having any trouble with cough or congestion however she did initially have a borderline O2 sat.  We attempted to get the patient up and she became very weak and lightheaded and had near syncopal episode.  Patient did not fall as she was held and assisted back into the bed   [JK]  2048 No acute findings noted on labs except WBC.  Pt noted to have some decreased o2 sats.  Considering the syncope earlier, will CT to rule out PE.   [JK]    Clinical Course User Index [JK] Linwood Dibbles, MD    Pt presented to the ED for evaluation of pain associated with a fall over the weekend.  Xrays do not show any definite fracture however the patient does have significant osteoporosis and there  certainly could be an occult fracture not evident on the plain films.  Initially plan on having the patient try to walk around and see if her pain had improved.  When we attempted to do that the patient had a syncopal episode.  We had to hold the patient so that she would not fall.  We are able to get her back into the bed without any injury.  Noted the patient's oxygen levels were borderline low.  She denies any trouble with coughing or chest pain.  Additional labs were ordered after her syncopal episode.  She is not anemic.  She does not have any findings of dehydration.  Cardiac enzymes and BNP are unremarkable.  Because of her borderline oxygen levels I will CT her chest to make sure she does not have a pulmonary embolism.  It is possible the patient's syncopal episode was associated with a vasovagal episode from her pain.  Plan on admission to the hospital for further evaluation.  Final Clinical Impressions(s) / ED Diagnoses   Final diagnoses:  Near syncope  Fall, initial encounter      Linwood Dibbles, MD 01/08/18 2105

## 2018-01-08 NOTE — ED Notes (Signed)
Patient unable to tolerate a bedpan after in and out completed-patient given perineal care and purewick placed. Granddaughter at bedside. Complete linen change

## 2018-01-08 NOTE — ED Notes (Signed)
ED TO INPATIENT HANDOFF REPORT  Name/Age/Gender Krystal Patrick 84 y.o. female  Code Status Code Status History    Date Active Date Inactive Code Status Order ID Comments User Context   05/30/2017 2302 06/03/2017 2014 Full Code [id number]  Ivor Costa, MD ED    Advance Directive Documentation     Most Recent Value  Type of Advance Directive  Healthcare Power of Attorney, Living will  Pre-existing out of facility DNR order (yellow form or pink MOST form)  -  "MOST" Form in Place?  -      Home/SNF/Other Home  Chief Complaint Fall  Level of Care/Admitting Diagnosis ED Disposition    ED Disposition Condition [place]: [place] [100102]  Level of Care: Telemetry [5]  Admit to tele based on following criteria: Other see comments  Comments: Syncope  Diagnosis: Syncope [206001]  Admitting Physician: Bethena Roys [phone]  Attending Physician: Bethena Roys [email or web address]  PT Class (Do Not Modify): Observation [104]  PT Acc Code (Do Not Modify): Observation [10022]       Medical History Past Medical History:  Diagnosis Date  . AAA (abdominal aortic aneurysm) ([place])   . CAD (coronary artery disease)   . GERD (gastroesophageal reflux disease)   . Hypertension     Allergies Allergies  Allergen Reactions  . Penicillins Rash    Has patient had a PCN reaction causing immediate rash, facial/tongue/throat swelling, SOB or lightheadedness with hypotension: Unknown Has patient had a PCN reaction causing severe rash involving mucus membranes or skin necrosis: Unknown Has patient had a PCN reaction that required hospitalization: No Has patient had a PCN reaction occurring within the last 10 years: No If all of the above answers are "NO", then may proceed with Cephalosporin use.     IV Location/Drains/Wounds Patient Lines/Drains/Airways Status   Active Line/Drains/Airways    Name:   Placement date:   Placement time:   Site:    Days:   Peripheral IV 01/08/18 Right Hand   01/08/18    1631    Hand   less than 1   Peripheral IV 01/08/18 Left Antecubital   01/08/18    2123    Antecubital   less than 1   External Urinary Catheter   01/08/18    2028    -   less than 1   Pressure Injury 05/31/17  non-blanchable reddened areas on bil. elbows.  Areas closed.   05/31/17    1300     222   Pressure Injury 05/31/17 Stage I -  Intact skin with non-blanchable redness of a localized area usually over a bony prominence. non-blanchable area on bilateral elbows.   05/31/17    1300     222   Pressure Injury 05/31/17 Stage I -  Intact skin with non-blanchable redness of a localized area usually over a bony prominence. non-blanchable area left elbow area   05/31/17    1300     222          Labs/Imaging Results for orders placed or performed during the hospital encounter of 01/08/18 (from the past 48 hour(s))  CBC     Status: Abnormal   Collection Time: 01/08/18  4:35 PM  Result Value Ref Range   WBC 14.7 (H) 4.0 - 10.5 K/uL   RBC 4.94 3.87 - 5.11 MIL/uL   Hemoglobin 14.9 12.0 - 15.0 g/dL   HCT 45.4 36.0 - 46.0 %   MCV 91.9  78.0 - 100.0 fL   MCH 30.2 26.0 - 34.0 pg   MCHC 32.8 30.0 - 36.0 g/dL   RDW 13.5 11.5 - 15.5 %   Platelets 340 150 - 400 K/uL    Comment: Performed at [facility], [place] [address], [place], [place] [address]  Basic metabolic panel     Status: Abnormal   Collection Time: 01/08/18  4:35 PM  Result Value Ref Range   Sodium 139 135 - 145 mmol/L   Potassium 4.8 3.5 - 5.1 mmol/L   Chloride 100 98 - 111 mmol/L   CO2 26 22 - 32 mmol/L   Glucose, Bld 127 (H) 70 - 99 mg/dL   BUN 24 (H) 8 - 23 mg/dL   Creatinine, Ser 1.12 (H) 0.44 - 1.00 mg/dL   Calcium 9.5 8.9 - 10.3 mg/dL   GFR calc non Af Amer 41 (L) >60 mL/min   GFR calc Af Amer 47 (L) >60 mL/min    Comment: (NOTE) The eGFR has been calculated using the CKD EPI equation. This calculation has not been validated in all clinical  situations. eGFR's persistently <60 mL/min signify possible Chronic Kidney Disease.    Anion gap 13 5 - 15    Comment: Performed at [facility], [place] [address], [place], [place] [address]  Troponin I     Status: None   Collection Time: 01/08/18  7:41 PM  Result Value Ref Range   Troponin I <0.03 <0.03 ng/mL    Comment: Performed at [facility], [place] [address], [place], [place] [address]  Brain natriuretic peptide     Status: Abnormal   Collection Time: 01/08/18  7:41 PM  Result Value Ref Range   B Natriuretic Peptide 109.6 (H) 0.0 - 100.0 pg/mL    Comment: Performed at [facility], [place] [address], [place], [place] [address]  Urinalysis, Routine w reflex microscopic     Status: None   Collection Time: 01/08/18  8:21 PM  Result Value Ref Range   Color, Urine YELLOW YELLOW   APPearance CLEAR CLEAR   Specific Gravity, Urine 1.011 1.005 - 1.030   pH 5.0 5.0 - 8.0   Glucose, UA NEGATIVE NEGATIVE mg/dL   Hgb urine dipstick NEGATIVE NEGATIVE   Bilirubin Urine NEGATIVE NEGATIVE   Ketones, ur NEGATIVE NEGATIVE mg/dL   Protein, ur NEGATIVE NEGATIVE mg/dL   Nitrite NEGATIVE NEGATIVE   Leukocytes, UA NEGATIVE NEGATIVE    Comment: Performed at [facility] [address] [place], [place] [address]   Dg Chest 2 View  Result Date: 01/08/2018 CLINICAL DATA:  Weakness. EXAM: CHEST - 2 VIEW COMPARISON:  April 08, 2016 FINDINGS: The heart size and mediastinal contours are stable. The heart size is enlarged. The aorta is tortuous. There is no focal infiltrate, pulmonary edema, or pleural effusion. Calcified granulomas are identified bilaterally. The visualized skeletal structures are stable. IMPRESSION: No active cardiopulmonary disease. Electronically Signed   By: Abelardo Diesel M.D.   On: 01/08/2018 19:46   Dg Lumbar Spine Complete  Result Date: 01/08/2018 CLINICAL DATA:  84 year old female with progressive pain with  ambulation after suffering a fall last Saturday. EXAM: LUMBAR SPINE - COMPLETE 4+ VIEW COMPARISON:  Prior radiographs of the lumbar spine 06/02/2017 FINDINGS: Enlarging abdominal aortic aneurysm now measuring up to 4.1 cm compared to 3.9 cm previously. No evidence of acute fracture or malalignment. The vertebral body heights are maintained. Stable degenerative disc disease and lower lumbar facet  arthropathy. Mild grade 1 anterolisthesis of L4 on L5 is similar compared to prior. Surgical clips in the right upper quadrant consistent with prior cholecystectomy. The bones are diffusely demineralized. IMPRESSION: 1. No acute fracture or malalignment. 2. Slight interval enlargement in abdominal aortic aneurysm now measuring up to 4.1 cm compared to 3.9 cm in January of 2019. 3. Lower lumbar degenerative disc disease and facet arthropathy. 4. Diffuse demineralization. Electronically Signed   By: Jacqulynn Cadet M.D.   On: 01/08/2018 18:03   Dg Pelvis 1-2 Views  Result Date: 01/08/2018 CLINICAL DATA:  84 year old female with progressive pain during ambulation since suffering a fall last Saturday. EXAM: PELVIS - 1-2 VIEW COMPARISON:  Concurrently obtained radiographs of the lumbar spine and sacrum; prior pelvic radiographs 06/02/2017 FINDINGS: There is no evidence of pelvic fracture or diastasis. No pelvic bone lesions are seen. The bones are diffusely demineralized. Atherosclerotic calcifications noted in the bilateral femoral arteries. IMPRESSION: No definite acute fracture identified. Please note that evaluation for nondisplaced fractures is limited by the underlying osteopenia. If clinical concern persists, further imaging with MRI would be recommended as CT scan would also likely be insensitive. Electronically Signed   By: Jacqulynn Cadet M.D.   On: 01/08/2018 18:07   Dg Sacrum/coccyx  Result Date: 01/08/2018 CLINICAL DATA:  84 year old female with progressive lower back pain with ambulation since sustaining a  fall last Saturday EXAM: SACRUM AND COCCYX - 2+ VIEW COMPARISON:  Concurrently obtained radiographs of the lumbar spine and pelvis; prior radiographs of the pelvis 06/02/2017 FINDINGS: The bones are diffusely demineralized. No evidence of acute fracture or malalignment. Lower lumbar degenerative disc disease and facet arthropathy. IMPRESSION: No definite fracture identified although sensitivity for nondisplaced fractures is significantly limited by underlying osteopenia. If clinical concern persists, recommend further evaluation with pelvic MRI. Pelvic CT scan will likely be insufficiently sensitive due to the underlying osteopenia. Electronically Signed   By: Jacqulynn Cadet M.D.   On: 01/08/2018 18:05   Ct Angio Chest Pe W And/or Wo Contrast  Result Date: 01/08/2018 CLINICAL DATA:  Shortness of breath EXAM: CT ANGIOGRAPHY CHEST WITH CONTRAST TECHNIQUE: Multidetector CT imaging of the chest was performed using the standard protocol during bolus administration of intravenous contrast. Multiplanar CT image reconstructions and MIPs were obtained to evaluate the vascular anatomy. CONTRAST:  80 mL Isovue 370 intravenous COMPARISON:  Chest x-ray 01/08/2018 FINDINGS: Cardiovascular: Satisfactory opacification of the pulmonary arteries to the segmental level. No evidence of pulmonary embolism. Nonaneurysmal aorta. Moderate severe aortic atherosclerosis. No dissection. Coronary vascular calcification. Mild cardiomegaly. No pericardial effusion Mediastinum/Nodes: Midline trachea. 11 mm hypodense nodule left lobe of thyroid. Small hiatal hernia. No significant adenopathy Lungs/Pleura: Mild emphysema. No acute consolidation or effusion. No pneumothorax Upper Abdomen: No acute abnormality. Musculoskeletal: Mild compression deformity of T2, uncertain chronicity. Review of the MIP images confirms the above findings. IMPRESSION: 1. Negative for acute pulmonary embolus or aortic dissection 2. No acute pulmonary infiltrate.   Mild emphysema 3. Age indeterminate mild compression deformity T2 Aortic Atherosclerosis (ICD10-I70.0) and Emphysema (ICD10-J43.9). Electronically Signed   By: Donavan Foil M.D.   On: 01/08/2018 23:19    Pending Labs [employer] (From admission, onward)    Start     Ordered   Signed and Held  Basic metabolic panel  Tomorrow morning,   R     Signed and Held          Vitals/Pain Today's Vitals   01/08/18 2100 01/08/18 2145 01/08/18 2228 01/08/18 2230  BP:  125/81 (!) 147/72  130/68  Pulse: 75 81  84  Resp: 14 20  (!) 21  Temp:      TempSrc:      SpO2: 96% 91%  92%  PainSc:   6      Isolation Precautions No active isolations  Medications Medications  sodium chloride 0.9 % bolus 500 mL (0 mLs Intravenous Stopped 01/08/18 2246)    Followed by  0.9 %  sodium chloride infusion (1,000 mLs Intravenous New Bag/Given 01/08/18 2203)  Influenza vac split quadrivalent PF (FLUZONE HIGH-DOSE) injection 0.5 mL (has no administration in time range)  morphine 4 MG/ML injection 4 mg (4 mg Intravenous Given 01/08/18 2205)  iopamidol (ISOVUE-370) 76 % injection 80 mL (80 mLs Intravenous Contrast Given 01/08/18 2210)    Mobility non-ambulatory

## 2018-01-08 NOTE — ED Notes (Signed)
Patient experienced some shortness of breath while cleaning patient and rolling from side to side in bed for linen change

## 2018-01-08 NOTE — ED Triage Notes (Signed)
Pt via [facility] from home.  Pt reports fall at home on Saturday.  Fell while using walker to floor, landing on pillow.  Sat on floor approximately 45 minutes before being assisted to chair by family.  Pt reports progressive worsening pain that radiates from bottom to knees when moving.  No pain when stationary.

## 2018-01-08 NOTE — ED Notes (Signed)
Pt asked to stand and ambulate on room air for second attempt.  Pt was able to take approximately 6 steps at which point breathing became labor and pt reported she felt faint. RN requested that NT pull chair up behind pt at which point pt began to fall.  RN and NT able to assist pt to chair.  RN, NT, and MD able to assist pt back to bed, laying on left side, on 2L oxygen.  Family at bedside.

## 2018-01-09 ENCOUNTER — Observation Stay (HOSPITAL_COMMUNITY): Payer: Medicare Other

## 2018-01-09 ENCOUNTER — Other Ambulatory Visit: Payer: Self-pay

## 2018-01-09 DIAGNOSIS — I361 Nonrheumatic tricuspid (valve) insufficiency: Secondary | ICD-10-CM

## 2018-01-09 DIAGNOSIS — J439 Emphysema, unspecified: Secondary | ICD-10-CM | POA: Diagnosis present

## 2018-01-09 DIAGNOSIS — M810 Age-related osteoporosis without current pathological fracture: Secondary | ICD-10-CM | POA: Diagnosis present

## 2018-01-09 DIAGNOSIS — Z9049 Acquired absence of other specified parts of digestive tract: Secondary | ICD-10-CM | POA: Diagnosis not present

## 2018-01-09 DIAGNOSIS — Z8249 Family history of ischemic heart disease and other diseases of the circulatory system: Secondary | ICD-10-CM | POA: Diagnosis not present

## 2018-01-09 DIAGNOSIS — Z23 Encounter for immunization: Secondary | ICD-10-CM | POA: Diagnosis present

## 2018-01-09 DIAGNOSIS — I10 Essential (primary) hypertension: Secondary | ICD-10-CM

## 2018-01-09 DIAGNOSIS — R55 Syncope and collapse: Secondary | ICD-10-CM

## 2018-01-09 DIAGNOSIS — I4581 Long QT syndrome: Secondary | ICD-10-CM | POA: Diagnosis present

## 2018-01-09 DIAGNOSIS — N179 Acute kidney failure, unspecified: Secondary | ICD-10-CM | POA: Diagnosis present

## 2018-01-09 DIAGNOSIS — W19XXXA Unspecified fall, initial encounter: Secondary | ICD-10-CM

## 2018-01-09 DIAGNOSIS — Z7982 Long term (current) use of aspirin: Secondary | ICD-10-CM | POA: Diagnosis not present

## 2018-01-09 DIAGNOSIS — M5430 Sciatica, unspecified side: Secondary | ICD-10-CM | POA: Diagnosis not present

## 2018-01-09 DIAGNOSIS — K219 Gastro-esophageal reflux disease without esophagitis: Secondary | ICD-10-CM | POA: Diagnosis present

## 2018-01-09 DIAGNOSIS — Y92009 Unspecified place in unspecified non-institutional (private) residence as the place of occurrence of the external cause: Secondary | ICD-10-CM

## 2018-01-09 DIAGNOSIS — Z833 Family history of diabetes mellitus: Secondary | ICD-10-CM | POA: Diagnosis not present

## 2018-01-09 DIAGNOSIS — M5440 Lumbago with sciatica, unspecified side: Secondary | ICD-10-CM | POA: Diagnosis not present

## 2018-01-09 DIAGNOSIS — Z880 Allergy status to penicillin: Secondary | ICD-10-CM | POA: Diagnosis not present

## 2018-01-09 DIAGNOSIS — R0902 Hypoxemia: Secondary | ICD-10-CM | POA: Diagnosis present

## 2018-01-09 DIAGNOSIS — Z87891 Personal history of nicotine dependence: Secondary | ICD-10-CM | POA: Diagnosis not present

## 2018-01-09 DIAGNOSIS — E860 Dehydration: Secondary | ICD-10-CM | POA: Diagnosis present

## 2018-01-09 DIAGNOSIS — F329 Major depressive disorder, single episode, unspecified: Secondary | ICD-10-CM | POA: Diagnosis present

## 2018-01-09 DIAGNOSIS — I2510 Atherosclerotic heart disease of native coronary artery without angina pectoris: Secondary | ICD-10-CM | POA: Diagnosis present

## 2018-01-09 DIAGNOSIS — I714 Abdominal aortic aneurysm, without rupture: Secondary | ICD-10-CM | POA: Diagnosis present

## 2018-01-09 LAB — BASIC METABOLIC PANEL
ANION GAP: 10 (ref 5–15)
BUN: 19 mg/dL (ref 8–23)
CHLORIDE: 105 mmol/L (ref 98–111)
CO2: 25 mmol/L (ref 22–32)
Calcium: 8.5 mg/dL — ABNORMAL LOW (ref 8.9–10.3)
Creatinine, Ser: 0.81 mg/dL (ref 0.44–1.00)
GFR calc Af Amer: >60 mL/min (ref >60)
GFR calc non Af Amer: >60 mL/min (ref >60)
GLUCOSE: 149 mg/dL — AB (ref 70–99)
POTASSIUM: 4.4 mmol/L (ref 3.5–5.1)
Sodium: 140 mmol/L (ref 135–145)

## 2018-01-09 LAB — ECHOCARDIOGRAM COMPLETE
Height: 62 in
Weight: 2014.1226 oz

## 2018-01-09 LAB — TROPONIN I: Troponin I: 0.03 ng/mL (ref <0.03)

## 2018-01-09 LAB — MAGNESIUM: MAGNESIUM: 2 mg/dL (ref 1.7–2.4)

## 2018-01-09 MED ORDER — SODIUM CHLORIDE 0.9 % IV SOLN
INTRAVENOUS | Status: AC
  Administered 2018-01-09: 01:00:00 via INTRAVENOUS

## 2018-01-09 MED ORDER — METOPROLOL TARTRATE 25 MG PO TABS
25.0000 mg | ORAL_TABLET | Freq: Two times a day (BID) | ORAL | Status: DC
  Administered 2018-01-09 – 2018-01-12 (×8): 25 mg via ORAL
  Filled 2018-01-09 (×8): qty 1

## 2018-01-09 MED ORDER — ACETAMINOPHEN 325 MG PO TABS: 650.0000 mg | ORAL_TABLET | Freq: Four times a day (QID) | ORAL | Status: DC | PRN

## 2018-01-09 MED ORDER — MORPHINE SULFATE (PF) 2 MG/ML IV SOLN
2.0000 mg | INTRAVENOUS | Status: DC | PRN
  Administered 2018-01-09 (×2): 2 mg via INTRAVENOUS
  Filled 2018-01-09 (×2): qty 1

## 2018-01-09 MED ORDER — GABAPENTIN 300 MG PO CAPS
300.0000 mg | ORAL_CAPSULE | Freq: Three times a day (TID) | ORAL | Status: DC
  Administered 2018-01-09 – 2018-01-12 (×10): 300 mg via ORAL
  Filled 2018-01-09 (×10): qty 1

## 2018-01-09 MED ORDER — METHOCARBAMOL 500 MG PO TABS
750.0000 mg | ORAL_TABLET | Freq: Three times a day (TID) | ORAL | Status: DC | PRN
  Administered 2018-01-09 – 2018-01-11 (×3): 750 mg via ORAL
  Filled 2018-01-09 (×3): qty 2

## 2018-01-09 MED ORDER — POLYETHYLENE GLYCOL 3350 17 G PO PACK: 17.0000 g | PACK | Freq: Every day | ORAL | Status: DC | PRN

## 2018-01-09 MED ORDER — GABAPENTIN 600 MG PO TABS
300.0000 mg | ORAL_TABLET | Freq: Three times a day (TID) | ORAL | Status: DC
  Filled 2018-01-09 (×2): qty 0.5

## 2018-01-09 MED ORDER — ACETAMINOPHEN 650 MG RE SUPP: 650.0000 mg | Freq: Four times a day (QID) | RECTAL | Status: DC | PRN

## 2018-01-09 MED ORDER — ENSURE ENLIVE PO LIQD
237.0000 mL | Freq: Two times a day (BID) | ORAL | Status: DC
  Administered 2018-01-09 – 2018-01-12 (×3): 237 mL via ORAL

## 2018-01-09 MED ORDER — ONDANSETRON HCL 4 MG/2ML IJ SOLN: 4.0000 mg | Freq: Four times a day (QID) | INTRAMUSCULAR | Status: DC | PRN

## 2018-01-09 MED ORDER — DEXAMETHASONE SODIUM PHOSPHATE 4 MG/ML IJ SOLN
4.0000 mg | Freq: Once | INTRAMUSCULAR | Status: AC
  Administered 2018-01-09: 4 mg via INTRAVENOUS
  Filled 2018-01-09: qty 1

## 2018-01-09 MED ORDER — ACETAMINOPHEN 500 MG PO TABS
1000.0000 mg | ORAL_TABLET | Freq: Four times a day (QID) | ORAL | Status: AC
  Administered 2018-01-09 – 2018-01-10 (×3): 1000 mg via ORAL
  Filled 2018-01-09 (×3): qty 2

## 2018-01-09 MED ORDER — HEPARIN SODIUM (PORCINE) 5000 UNIT/ML IJ SOLN
5000.0000 [IU] | Freq: Three times a day (TID) | INTRAMUSCULAR | Status: DC
  Administered 2018-01-09 – 2018-01-11 (×9): 5000 [IU] via SUBCUTANEOUS
  Filled 2018-01-09 (×10): qty 1

## 2018-01-09 MED ORDER — ONDANSETRON HCL 4 MG PO TABS: 4.0000 mg | ORAL_TABLET | Freq: Four times a day (QID) | ORAL | Status: DC | PRN

## 2018-01-09 MED ORDER — ADULT MULTIVITAMIN W/MINERALS CH
1.0000 | ORAL_TABLET | Freq: Every day | ORAL | Status: DC
  Administered 2018-01-09 – 2018-01-12 (×4): 1 via ORAL
  Filled 2018-01-09 (×4): qty 1

## 2018-01-09 MED ORDER — ASPIRIN EC 81 MG PO TBEC
81.0000 mg | DELAYED_RELEASE_TABLET | Freq: Every day | ORAL | Status: DC
  Administered 2018-01-09 – 2018-01-12 (×4): 81 mg via ORAL
  Filled 2018-01-09 (×4): qty 1

## 2018-01-09 NOTE — Progress Notes (Signed)
PROGRESS NOTE [facility]   Emporia Lafave   ZOX:[id number] DOB: [birth year]  DOA: 01/08/2018 PCP: Kaleen Mask, MD   Brief Narrative:  Krystal Patrick is a 84 year old female with medical history significant for AAA, hypertension, CAD, depression who presented to the emergency department complaining of hip/lumbar pain and ability to walk.  Patient sustaining a mechanical fall 5 days prior to admission subsequently was doing well however developed severe pain 2 days prior to admission.  Upon ED evaluation images were all negative for acute abnormality of definite definitive fracture however when attempted to walk patient was unable to do so and desaturated to low 80s.  Patient had a near syncopal episode witnessed in the ED.  Patient was admitted with working diagnosis of presyncope and AKI.  Subjective: Patient seen and examined, she continues to complain of pain in the lower back goes to her buttocks.  She report muscle spasm and stabbing pain.  Otherwise no complaints.  Denies chest pain, shortness of breath and palpitation.  Assessment & Plan: Near syncope Patient did not lose consciousness, EKG with prolonged QTC at 562, troponin is negative.  She was found to be mildly hypoxic with ambulation, chest x-ray was negative and CTA negative for PE. Mild emphysema on CTA of the chest.  Feel to be vasovagal episode related to pain.  Patient was also dehydrated which could be contributed to presyncopal episode. Echocardiogram pending, monitor orthostatics.    Lower back pain  Likely related to fall, patient was sitting on a rigid doughnut, likely contributing to sciatic pain.  Also having muscle spasms.  X-rays were negative however patient with osteoporosis/osteopenia so unable to rule out hairline fractures. Will give trial of steroids, Robaxin and gabapentin. PT recommending SNF.  Hypoxia  Unclear etiology at this time, CXR negative, CTA negative for PE however signs of  emphysema.  History of smoking.  Asymptomatic, not complaining of shortness of breath.  Hypoxia is mainly on exertion ?  Poor pulmonary reserve from emphysema.  Wean oxygen as able.  Continue to monitor  AKI Likely dehydration IV fluid given and creatinine improved.  Okay to DC IV fluids and encourage oral hydration.  Holding lisinopril and check renal function in a.m.  Prolonged QTC Repeated EKG QTC normalized. Continue to hold citalopram, check EKG in a.m.  Hypertension BP stable, continue current regimen   DVT prophylaxis: Stable  Code Status: Full code Family Communication: Family at bedside Disposition Plan: Home in a.m. with home health PT if decline SNF.  Consultants:   None  Procedures:   Echocardiogram  Antimicrobials:  None   Objective: Vitals:   01/09/18 1134 01/09/18 1135 01/09/18 1313 01/09/18 1456  BP:    132/89  Pulse:    66  Resp:    12  Temp:    98.4 F (36.9 C)  TempSrc:    Oral  SpO2: (!) 82% (!) 89% (!) 89% 91%  Weight:      Height:        Intake/Output Summary (Last 24 hours) at 01/09/2018 1641 Last data filed at 01/09/2018 1458 Gross per 24 hour  Intake 1757.53 ml  Output 725 ml  Net 1032.53 ml   Filed Weights   01/09/18 0034  Weight: 57.1 kg    Examination:  General exam: Mild anxious due to back pain HEENT: OP moist and clear Respiratory system: Clear to auscultation. No wheezes,crackle or rhonchi Cardiovascular system: S1 & S2 heard, RRR. No JVD, murmurs, rubs or gallops Gastrointestinal system: Abdomen is  nondistended, soft and nontender.  Central nervous system: Alert and oriented. No focal neurological deficits. Extremities/musculoskeletal: No pedal edema.  Lower back paraspinal tenderness, tender to palpation around sciatic nerves. Skin: No rashes, lesions or ulcers Psychiatry:  Mood & affect appropriate.    Data Reviewed: I have personally reviewed following labs and imaging studies  CBC: Recent Labs  Lab  01/08/18 1635  WBC 14.7*  HGB 14.9  HCT 45.4  MCV 91.9  PLT 340   Basic Metabolic Panel: Recent Labs  Lab 01/08/18 1635 01/09/18 0146  NA 139 140  K 4.8 4.4  CL 100 105  CO2 26 25  GLUCOSE 127* 149*  BUN 24* 19  CREATININE 1.12* 0.81  CALCIUM 9.5 8.5*  MG  --  2.0   GFR: Estimated Creatinine Clearance: 33.6 mL/min (by C-G formula based on SCr of 0.81 mg/dL). Liver Function Tests: No results for input(s): AST, ALT, ALKPHOS, BILITOT, PROT, ALBUMIN in the last 168 hours. No results for input(s): LIPASE, AMYLASE in the last 168 hours. No results for input(s): AMMONIA in the last 168 hours. Coagulation Profile: No results for input(s): INR, PROTIME in the last 168 hours. Cardiac Enzymes: Recent Labs  Lab 01/08/18 1941 01/09/18 0146 01/09/18 0733  TROPONINI <0.03 <0.03 0.03*   BNP (last 3 results) No results for input(s): PROBNP in the last 8760 hours. HbA1C: No results for input(s): HGBA1C in the last 72 hours. CBG: No results for input(s): GLUCAP in the last 168 hours. Lipid Profile: No results for input(s): CHOL, HDL, LDLCALC, TRIG, CHOLHDL, LDLDIRECT in the last 72 hours. Thyroid Function Tests: No results for input(s): TSH, T4TOTAL, FREET4, T3FREE, THYROIDAB in the last 72 hours. Anemia Panel: No results for input(s): VITAMINB12, FOLATE, FERRITIN, TIBC, IRON, RETICCTPCT in the last 72 hours. Sepsis Labs: No results for input(s): PROCALCITON, LATICACIDVEN in the last 168 hours.  No results found for this or any previous visit (from the past 240 hour(s)).    Radiology Studies: Dg Chest 2 View  Result Date: 01/08/2018 CLINICAL DATA:  Weakness. EXAM: CHEST - 2 VIEW COMPARISON:  April 08, 2016 FINDINGS: The heart size and mediastinal contours are stable. The heart size is enlarged. The aorta is tortuous. There is no focal infiltrate, pulmonary edema, or pleural effusion. Calcified granulomas are identified bilaterally. The visualized skeletal structures are  stable. IMPRESSION: No active cardiopulmonary disease. Electronically Signed   By: Sherian Rein M.D.   On: 01/08/2018 19:46   Dg Lumbar Spine Complete  Result Date: 01/08/2018 CLINICAL DATA:  84 year old female with progressive pain with ambulation after suffering a fall last Saturday. EXAM: LUMBAR SPINE - COMPLETE 4+ VIEW COMPARISON:  Prior radiographs of the lumbar spine 06/02/2017 FINDINGS: Enlarging abdominal aortic aneurysm now measuring up to 4.1 cm compared to 3.9 cm previously. No evidence of acute fracture or malalignment. The vertebral body heights are maintained. Stable degenerative disc disease and lower lumbar facet arthropathy. Mild grade 1 anterolisthesis of L4 on L5 is similar compared to prior. Surgical clips in the right upper quadrant consistent with prior cholecystectomy. The bones are diffusely demineralized. IMPRESSION: 1. No acute fracture or malalignment. 2. Slight interval enlargement in abdominal aortic aneurysm now measuring up to 4.1 cm compared to 3.9 cm in January of 2019. 3. Lower lumbar degenerative disc disease and facet arthropathy. 4. Diffuse demineralization. Electronically Signed   By: Malachy Moan M.D.   On: 01/08/2018 18:03   Dg Pelvis 1-2 Views  Result Date: 01/08/2018 CLINICAL DATA:  84 year old female with  progressive pain during ambulation since suffering a fall last Saturday. EXAM: PELVIS - 1-2 VIEW COMPARISON:  Concurrently obtained radiographs of the lumbar spine and sacrum; prior pelvic radiographs 06/02/2017 FINDINGS: There is no evidence of pelvic fracture or diastasis. No pelvic bone lesions are seen. The bones are diffusely demineralized. Atherosclerotic calcifications noted in the bilateral femoral arteries. IMPRESSION: No definite acute fracture identified. Please note that evaluation for nondisplaced fractures is limited by the underlying osteopenia. If clinical concern persists, further imaging with MRI would be recommended as CT scan would also  likely be insensitive. Electronically Signed   By: Malachy Moan M.D.   On: 01/08/2018 18:07   Dg Sacrum/coccyx  Result Date: 01/08/2018 CLINICAL DATA:  84 year old female with progressive lower back pain with ambulation since sustaining a fall last Saturday EXAM: SACRUM AND COCCYX - 2+ VIEW COMPARISON:  Concurrently obtained radiographs of the lumbar spine and pelvis; prior radiographs of the pelvis 06/02/2017 FINDINGS: The bones are diffusely demineralized. No evidence of acute fracture or malalignment. Lower lumbar degenerative disc disease and facet arthropathy. IMPRESSION: No definite fracture identified although sensitivity for nondisplaced fractures is significantly limited by underlying osteopenia. If clinical concern persists, recommend further evaluation with pelvic MRI. Pelvic CT scan will likely be insufficiently sensitive due to the underlying osteopenia. Electronically Signed   By: Malachy Moan M.D.   On: 01/08/2018 18:05   Ct Angio Chest Pe W And/or Wo Contrast  Result Date: 01/08/2018 CLINICAL DATA:  Shortness of breath EXAM: CT ANGIOGRAPHY CHEST WITH CONTRAST TECHNIQUE: Multidetector CT imaging of the chest was performed using the standard protocol during bolus administration of intravenous contrast. Multiplanar CT image reconstructions and MIPs were obtained to evaluate the vascular anatomy. CONTRAST:  80 mL Isovue 370 intravenous COMPARISON:  Chest x-ray 01/08/2018 FINDINGS: Cardiovascular: Satisfactory opacification of the pulmonary arteries to the segmental level. No evidence of pulmonary embolism. Nonaneurysmal aorta. Moderate severe aortic atherosclerosis. No dissection. Coronary vascular calcification. Mild cardiomegaly. No pericardial effusion Mediastinum/Nodes: Midline trachea. 11 mm hypodense nodule left lobe of thyroid. Small hiatal hernia. No significant adenopathy Lungs/Pleura: Mild emphysema. No acute consolidation or effusion. No pneumothorax Upper Abdomen: No acute  abnormality. Musculoskeletal: Mild compression deformity of T2, uncertain chronicity. Review of the MIP images confirms the above findings. IMPRESSION: 1. Negative for acute pulmonary embolus or aortic dissection 2. No acute pulmonary infiltrate.  Mild emphysema 3. Age indeterminate mild compression deformity T2 Aortic Atherosclerosis (ICD10-I70.0) and Emphysema (ICD10-J43.9). Electronically Signed   By: Jasmine Pang M.D.   On: 01/08/2018 23:19   Dg Chest Port 1 View  Result Date: 01/09/2018 CLINICAL DATA:  Hypoxia. EXAM: PORTABLE CHEST 1 VIEW COMPARISON:  Yesterday. FINDINGS: Stable enlarged cardiac silhouette and tortuous and calcified thoracic aorta. The lungs remain mildly hyperexpanded with stable mild prominence of the pulmonary vasculature and interstitial markings. Previously demonstrated right lower lobe calcified granuloma. Diffuse osteopenia. IMPRESSION: Stable cardiomegaly, mild pulmonary vascular congestion and changes of COPD. Electronically Signed   By: Beckie Salts M.D.   On: 01/09/2018 15:41      Scheduled Meds: . acetaminophen  1,000 mg Oral Q6H  . aspirin EC  81 mg Oral Daily  . feeding supplement (ENSURE ENLIVE)  237 mL Oral BID BM  . gabapentin  300 mg Oral TID  . heparin  5,000 Units Subcutaneous Q8H  . metoprolol tartrate  25 mg Oral BID  . multivitamin with minerals  1 tablet Oral Daily   Continuous Infusions:   LOS: 0 days  Time spent: Total of 25 minutes spent with pt, greater than 50% of which was spent in discussion of  treatment, counseling and coordination of care   Latrelle Dodrill, MD Pager: Text Page via [email or web address]   If 7PM-7AM, please contact night-coverage [email or web address] 01/09/2018, 4:41 PM   Note - This record has been created using [employer]. Chart creation errors have been sought, but may not always have been located. Such creation errors do not reflect on the standard of medical care.

## 2018-01-09 NOTE — Progress Notes (Signed)
Initial Nutrition Assessment  DOCUMENTATION CODES:   Not applicable  INTERVENTION:  - Continue Ensure Enlive BID, each supplement provides 350 kcal and 20 grams of protein. - Will order daily multivitamin with minerals. - Continue to encourage PO intakes.    NUTRITION DIAGNOSIS:   Increased nutrient needs related to other (see comment)(hospitalization ) as evidenced by estimated needs.  GOAL:   Patient will meet greater than or equal to 90% of their needs  MONITOR:   PO intake, Supplement acceptance, Weight trends, Labs  REASON FOR ASSESSMENT:   Malnutrition Screening Tool  ASSESSMENT:   84 y.o. female with medical history significant for AAA, HTN, CAD, and depression. Patient reported she fell 5 days PTA (on 8/31) while trying to sit on the seat of her walker which had a pillow on it.  She did not have the brakes on on the walker, the pillow slid off and she fell on the floor. She reported pain in her sacral area since the fall. She was initially able to bear weight but could not bear weight or get up on the date of admission (9/5). She reported mild intermittent dizziness, denies chest pain or SOB prior to or after the fall, denies vomiting, loose stools, and reported she has maintained good p.o. intake.  BMI indicates normal weight. Patient ate 50% of breakfast this AM which consisted of an omelet with mushrooms and cheese, toast with jelly, and orange juice (240 kcal, 12 grams of protein). Patient reports good appetite now and PTA, no changes in appetite, no issues chewing or swallowing. She was able to drink part of an Ensure after breakfast.  Per Dr. Fredirick Lathe note yesterday overnight: near syncope/syncopal episode in the ED questioned if this was a vasovagal episode related to pain, mild hypoxia, fall PTA with x-ray negative for any fractures, and questionable mild AKI.    Medications reviewed. Labs reviewed; Ca: 8.5 mg/dL. IVF; NS @ 75 mL/hr.     NUTRITION - FOCUSED  PHYSICAL EXAM:  Completed; no muscle and no fat wasting.   Diet Order:   Diet Order            Diet Heart Room service appropriate? Yes; Fluid consistency: Thin  Diet effective now              EDUCATION NEEDS:   No education needs have been identified at this time  Skin:  Skin Assessment: Reviewed RN Assessment  Last BM:  9/5  Height:   Ht Readings from Last 1 Encounters:  01/09/18 5\' 2"  (1.575 m)    Weight:   Wt Readings from Last 1 Encounters:  01/09/18 57.1 kg    Ideal Body Weight:  50 kg  BMI:  Body mass index is 23.02 kg/m.  Estimated Nutritional Needs:   Kcal:  7353-2992  Protein:  55-65 grams  Fluid:  >/= 1.5 L/day     Trenton Gammon, MS, RD, LDN, [facility] Inpatient Clinical Dietitian Pager # [phone] After hours/weekend pager # [phone]

## 2018-01-09 NOTE — Care Management Obs Status (Signed)
MEDICARE OBSERVATION STATUS NOTIFICATION   Patient Details  Name: Krystal Patrick MRN: [id number] Date of Birth: 27-Apr-1921   Medicare Observation Status Notification Given:   yes, left in pt's room.     Geni Bers, RN 01/09/2018, 4:18 PM

## 2018-01-09 NOTE — Progress Notes (Signed)
  Echocardiogram 2D Echocardiogram has been performed.  Krystal Patrick 01/09/2018, 2:25 PM

## 2018-01-09 NOTE — Evaluation (Signed)
Physical Therapy Evaluation Patient Details Name: Krystal Patrick MRN: [id number] DOB: 03/11/1921 Today's Date: 01/09/2018   History of Present Illness  Patient is a 84 yr old female admittied 01/08/2018 with diagnosis of near syncopal episode and fall at home 5 days prior to admission.  PMH: AAA, HTN, CAD, GERD, depression.     Clinical Impression  Patient requires assistance with all bed mobility and transfers at this time. Refused to ambulate today due to complaints of pain in sacrum and bilateral legs.  Patient would continue to benefit from skilled physical therapy in current environment and next venue to continue return to prior function and increase strength, endurance, balance, coordination, and functional mobility and gait skills. Patient continues to be unsafe to discharge home alone due to pain and significant decrease in mobility and will benefit from continued post-acute therapy. Currently recommending skilled nursing facility stay unless caregivers can provide 24/7 caregiver assistance and supervision to the level that patient currently requires. Patient stated her daughter recently had a hip replacement surgery herself. Granddaughter in town from [place] to help.       Follow Up Recommendations SNF;Supervision/Assistance - 24 hour;Other (comment)(patient states she would prefer to go home with daughter, granddaughter and aide for 24/7 assistance and HHPT)    Equipment Recommendations  None recommended by PT    Recommendations for Other Services       Precautions / Restrictions Precautions Precautions: Fall Restrictions Weight Bearing Restrictions: No      Mobility  Bed Mobility Overal bed mobility: Needs Assistance Bed Mobility: Rolling;Sidelying to Sit;Sit to Sidelying Rolling: Min assist Sidelying to sit: Min assist     Sit to sidelying: Min assist General bed mobility comments: assist for bed sheets, B LEs and line management  Transfers Overall transfer  level: Needs assistance Equipment used: Rolling walker (2 wheeled) Transfers: Sit to/from Stand Sit to Stand: Mod assist         General transfer comment: incomplete sit-to-stand due to complaints of pain  Ambulation/Gait             General Gait Details: refused amb due to complaints of pain  Stairs            Wheelchair Mobility    Modified Rankin (Stroke Patients Only)       Balance Overall balance assessment: Needs assistance Sitting-balance support: Bilateral upper extremity supported;Feet supported Sitting balance-Leahy Scale: Fair     Standing balance support: Bilateral upper extremity supported;During functional activity Standing balance-Leahy Scale: Poor                               Pertinent Vitals/Pain Pain Assessment: 0-10 Pain Score: 7  Pain Location: waist down to legs; somewhat to bilateral feet Pain Descriptors / Indicators: Sharp;Sore;Aching Pain Intervention(s): Limited activity within patient's tolerance;Monitored during session    Home Living Family/patient expects to be discharged to:: Private residence Living Arrangements: Children(daughter) Available Help at Discharge: Family;Available 24 hours/day Type of Home: House Home Access: Stairs to enter Entrance Stairs-Rails: Right;Left;Can reach both Entrance Stairs-Number of Steps: 2 Home Layout: Two level;Able to live on main level with bedroom/bathroom Home Equipment: Toilet riser;Bedside commode;Walker - 2 wheels;Walker - 4 wheels;Grab bars - toilet;Grab bars - tub/shower;Tub bench;Cane - single point;Transport chair      Prior Function Level of Independence: Needs assistance   Gait / Transfers Assistance Needed: walking with Rollator  ADL's / Homemaking Assistance Needed: Cristie Hem helped with cleaning; daughter  does grocery shopping, patient and daugher do laundry; assistance for bathing/dressing  Comments: macular degeneration     Hand Dominance    Dominant Hand: Right    Extremity/Trunk Assessment   Upper Extremity Assessment Upper Extremity Assessment: Generalized weakness    Lower Extremity Assessment Lower Extremity Assessment: Generalized weakness    Cervical / Trunk Assessment Cervical / Trunk Assessment: Kyphotic  Communication   Communication: No difficulties  Cognition Arousal/Alertness: Awake/alert Behavior During Therapy: WFL for tasks assessed/performed Overall Cognitive Status: Within Functional Limits for tasks assessed                                        General Comments      Exercises     Assessment/Plan    PT Assessment Patient needs continued PT services  PT Problem List Decreased strength;Decreased mobility;Decreased activity tolerance;Decreased balance;Pain       PT Treatment Interventions DME instruction;Therapeutic activities;Gait training;Therapeutic exercise;Patient/family education;Balance training;Stair training    PT Goals (Current goals can be found in the Care Plan section)  Acute Rehab PT Goals Patient Stated Goal: stay at hospital another night and go home with family and aide to help her. PT Goal Formulation: With patient Time For Goal Achievement: 01/23/18 Potential to Achieve Goals: Fair    Frequency Min 3X/week   Barriers to discharge        Co-evaluation               AM-PAC PT "6 Clicks" Daily Activity  Outcome Measure Difficulty turning over in bed (including adjusting bedclothes, sheets and blankets)?: A Little Difficulty moving from lying on back to sitting on the side of the bed? : A Lot Difficulty sitting down on and standing up from a chair with arms (e.g., wheelchair, bedside commode, etc,.)?: A Lot Help needed moving to and from a bed to chair (including a wheelchair)?: Total Help needed walking in hospital room?: Total Help needed climbing 3-5 steps with a railing? : Total 6 Click Score: 10    End of Session Equipment Utilized  During Treatment: Gait belt Activity Tolerance: Patient limited by pain;Patient limited by fatigue Patient left: in bed;with bed alarm set Nurse Communication: Mobility status PT Visit Diagnosis: Unsteadiness on feet (R26.81);History of falling (Z91.81);Other abnormalities of gait and mobility (R26.89);Difficulty in walking, not elsewhere classified (R26.2);Muscle weakness (generalized) (M62.81)    Time: 4540-9811 PT Time Calculation (min) (ACUTE ONLY): 30 min   Charges:   PT Evaluation $PT Eval Moderate Complexity: 1 Mod PT Treatments $Therapeutic Activity: 8-22 mins        Katina Dung. Hartnett-Rands, MS, PT Per Diem PT [facility] [place] [phone] 01/09/2018, 11:26 AM

## 2018-01-10 LAB — CBC WITH DIFFERENTIAL/PLATELET
Basophils Absolute: 0 10*3/uL (ref 0.0–0.1)
Basophils Relative: 0 %
EOS ABS: 0 10*3/uL (ref 0.0–0.7)
EOS PCT: 0 %
HCT: 40.3 % (ref 36.0–46.0)
Hemoglobin: 13.1 g/dL (ref 12.0–15.0)
LYMPHS ABS: 0.8 10*3/uL (ref 0.7–4.0)
Lymphocytes Relative: 6 %
MCH: 30 pg (ref 26.0–34.0)
MCHC: 32.5 g/dL (ref 30.0–36.0)
MCV: 92.4 fL (ref 78.0–100.0)
MONO ABS: 0.9 10*3/uL (ref 0.1–1.0)
MONOS PCT: 7 %
NEUTROS PCT: 87 %
Neutro Abs: 10.7 10*3/uL — ABNORMAL HIGH (ref 1.7–7.7)
PLATELETS: 271 10*3/uL (ref 150–400)
RBC: 4.36 MIL/uL (ref 3.87–5.11)
RDW: 13.5 % (ref 11.5–15.5)
WBC: 12.4 10*3/uL — ABNORMAL HIGH (ref 4.0–10.5)

## 2018-01-10 LAB — BASIC METABOLIC PANEL
Anion gap: 8 (ref 5–15)
BUN: 17 mg/dL (ref 8–23)
CO2: 24 mmol/L (ref 22–32)
CREATININE: 0.75 mg/dL (ref 0.44–1.00)
Calcium: 8.8 mg/dL — ABNORMAL LOW (ref 8.9–10.3)
Chloride: 105 mmol/L (ref 98–111)
GFR calc non Af Amer: >60 mL/min (ref >60)
GLUCOSE: 125 mg/dL — AB (ref 70–99)
Potassium: 4.4 mmol/L (ref 3.5–5.1)
Sodium: 137 mmol/L (ref 135–145)

## 2018-01-10 LAB — MAGNESIUM: Magnesium: 2.2 mg/dL (ref 1.7–2.4)

## 2018-01-10 MED ORDER — ORAL CARE MOUTH RINSE
15.0000 mL | Freq: Two times a day (BID) | OROMUCOSAL | Status: DC
  Administered 2018-01-10 – 2018-01-12 (×4): 15 mL via OROMUCOSAL

## 2018-01-10 MED ORDER — ALBUTEROL SULFATE (2.5 MG/3ML) 0.083% IN NEBU
2.5000 mg | INHALATION_SOLUTION | RESPIRATORY_TRACT | Status: DC | PRN
  Administered 2018-01-10: 2.5 mg via RESPIRATORY_TRACT
  Filled 2018-01-10: qty 3

## 2018-01-10 MED ORDER — PREDNISONE 20 MG PO TABS
40.0000 mg | ORAL_TABLET | Freq: Every day | ORAL | Status: DC
  Administered 2018-01-10 – 2018-01-12 (×3): 40 mg via ORAL
  Filled 2018-01-10 (×3): qty 2

## 2018-01-10 NOTE — Progress Notes (Signed)
PROGRESS NOTE [facility]   Downey Redwine   ZOX:[id number] DOB: November 29, 1920  DOA: 01/08/2018 PCP: Kaleen Mask, MD   Brief Narrative:  Krystal Patrick is a 84 year old female with medical history significant for AAA, hypertension, CAD, depression who presented to the emergency department complaining of hip/lumbar pain and ability to walk.  Patient sustaining a mechanical fall 5 days prior to admission subsequently was doing well however developed severe pain 2 days prior to admission.  Upon ED evaluation images were all negative for acute abnormality of definite definitive fracture however when attempted to walk patient was unable to do so and desaturated to low 80s.  Patient had a near syncopal episode witnessed in the ED.  Patient was admitted with working diagnosis of presyncope and AKI.  Subjective: Patient seen and examined she is doing better, pain improved after dose of steroids and Robaxin. Worked with PT and recommending SNF she is in agreement   Assessment & Plan: Near syncope Patient did not lose consciousness, EKG with prolonged QTC at 562, troponin is negative. She was found to be mildly hypoxic with ambulation, chest x-ray was negative and CTA negative for PE. Mild emphysema on CTA of the chest.  Feel to be vasovagal episode related to pain. Patient was also dehydrated which could be contributed to presyncopal episode. ECHO with no abnormalities. No recurrent episodes, normal orthostatics.   Lower back pain  Likely related to fall, patient was sitting on a rigid doughnut, likely contributing to sciatic pain.  Also having muscle spasms.  X-rays were negative however patient with osteoporosis/osteopenia so unable to rule out hairline fractures. Improved after trial of steroids gabapentin and robaxin. Will give a 5 days course of prednisone. Continue Robaxin and gabapentin. Awaiting SNF placement   Hypoxia - resolved  Unclear etiology at this time, CXR negative,  but changes consistent with COPD, CTA negative for PE however signs of emphysema.  History of smoking.  Asymptomatic, not complaining of shortness of breath.  Hypoxia is mainly on exertion ?  Poor pulmonary reserve from emphysema. Albuterol as needed. Continue to monitor   AKI - resolved  Felt to be dehydration Encourage oral hydration, avoid nephrotoxic agents   Prolonged QTC QTc Normalized  Continue to hold citalopram   Hypertension BP stable, continue current regimen   DVT prophylaxis: Stable  Code Status: Full code Family Communication: Family at bedside Disposition Plan:SNF in AM   Consultants:   None  Procedures:   Echocardiogram  Antimicrobials:  None   Objective: Vitals:   01/09/18 1313 01/09/18 1456 01/09/18 2037 01/10/18 0625  BP:  132/89 135/77 132/78  Pulse:  66 70 (!) 58  Resp:  12 16 18   Temp:  98.4 F (36.9 C) 97.7 F (36.5 C) 98 F (36.7 C)  TempSrc:  Oral Oral Oral  SpO2: (!) 89% 91% 93% 95%  Weight:      Height:        Intake/Output Summary (Last 24 hours) at 01/10/2018 1206 Last data filed at 01/10/2018 8119 Gross per 24 hour  Intake 372.27 ml  Output 725 ml  Net -352.73 ml   Filed Weights   01/09/18 0034  Weight: 57.1 kg    Examination:  General: Pt is alert, awake, not in acute distress Cardiovascular: RRR, S1/S2 +, no rubs, no gallops Respiratory: Air entry diminish in b/l bases. No wheezing, rales or crackles.  Abdominal: Soft, NT, ND, bowel sounds + Extremities: no edema  Data Reviewed: I have personally reviewed following labs and  imaging studies  CBC: Recent Labs  Lab 01/08/18 1635 01/10/18 0515  WBC 14.7* 12.4*  NEUTROABS  --  10.7*  HGB 14.9 13.1  HCT 45.4 40.3  MCV 91.9 92.4  PLT 340 271   Basic Metabolic Panel: Recent Labs  Lab 01/08/18 1635 01/09/18 0146 01/10/18 0515  NA 139 140 137  K 4.8 4.4 4.4  CL 100 105 105  CO2 26 25 24   GLUCOSE 127* 149* 125*  BUN 24* 19 17  CREATININE 1.12* 0.81 0.75    CALCIUM 9.5 8.5* 8.8*  MG  --  2.0 2.2   GFR: Estimated Creatinine Clearance: 34 mL/min (by C-G formula based on SCr of 0.75 mg/dL). Liver Function Tests: No results for input(s): AST, ALT, ALKPHOS, BILITOT, PROT, ALBUMIN in the last 168 hours. No results for input(s): LIPASE, AMYLASE in the last 168 hours. No results for input(s): AMMONIA in the last 168 hours. Coagulation Profile: No results for input(s): INR, PROTIME in the last 168 hours. Cardiac Enzymes: Recent Labs  Lab 01/08/18 1941 01/09/18 0146 01/09/18 0733  TROPONINI <0.03 <0.03 0.03*   BNP (last 3 results) No results for input(s): PROBNP in the last 8760 hours. HbA1C: No results for input(s): HGBA1C in the last 72 hours. CBG: No results for input(s): GLUCAP in the last 168 hours. Lipid Profile: No results for input(s): CHOL, HDL, LDLCALC, TRIG, CHOLHDL, LDLDIRECT in the last 72 hours. Thyroid Function Tests: No results for input(s): TSH, T4TOTAL, FREET4, T3FREE, THYROIDAB in the last 72 hours. Anemia Panel: No results for input(s): VITAMINB12, FOLATE, FERRITIN, TIBC, IRON, RETICCTPCT in the last 72 hours. Sepsis Labs: No results for input(s): PROCALCITON, LATICACIDVEN in the last 168 hours.  No results found for this or any previous visit (from the past 240 hour(s)).    Radiology Studies: Dg Chest 2 View  Result Date: 01/08/2018 CLINICAL DATA:  Weakness. EXAM: CHEST - 2 VIEW COMPARISON:  April 08, 2016 FINDINGS: The heart size and mediastinal contours are stable. The heart size is enlarged. The aorta is tortuous. There is no focal infiltrate, pulmonary edema, or pleural effusion. Calcified granulomas are identified bilaterally. The visualized skeletal structures are stable. IMPRESSION: No active cardiopulmonary disease. Electronically Signed   By: Sherian Rein M.D.   On: 01/08/2018 19:46   Dg Lumbar Spine Complete  Result Date: 01/08/2018 CLINICAL DATA:  84 year old female with progressive pain with  ambulation after suffering a fall last Saturday. EXAM: LUMBAR SPINE - COMPLETE 4+ VIEW COMPARISON:  Prior radiographs of the lumbar spine 06/02/2017 FINDINGS: Enlarging abdominal aortic aneurysm now measuring up to 4.1 cm compared to 3.9 cm previously. No evidence of acute fracture or malalignment. The vertebral body heights are maintained. Stable degenerative disc disease and lower lumbar facet arthropathy. Mild grade 1 anterolisthesis of L4 on L5 is similar compared to prior. Surgical clips in the right upper quadrant consistent with prior cholecystectomy. The bones are diffusely demineralized. IMPRESSION: 1. No acute fracture or malalignment. 2. Slight interval enlargement in abdominal aortic aneurysm now measuring up to 4.1 cm compared to 3.9 cm in January of 2019. 3. Lower lumbar degenerative disc disease and facet arthropathy. 4. Diffuse demineralization. Electronically Signed   By: Malachy Moan M.D.   On: 01/08/2018 18:03   Dg Pelvis 1-2 Views  Result Date: 01/08/2018 CLINICAL DATA:  84 year old female with progressive pain during ambulation since suffering a fall last Saturday. EXAM: PELVIS - 1-2 VIEW COMPARISON:  Concurrently obtained radiographs of the lumbar spine and sacrum; prior pelvic radiographs  06/02/2017 FINDINGS: There is no evidence of pelvic fracture or diastasis. No pelvic bone lesions are seen. The bones are diffusely demineralized. Atherosclerotic calcifications noted in the bilateral femoral arteries. IMPRESSION: No definite acute fracture identified. Please note that evaluation for nondisplaced fractures is limited by the underlying osteopenia. If clinical concern persists, further imaging with MRI would be recommended as CT scan would also likely be insensitive. Electronically Signed   By: Malachy Moan M.D.   On: 01/08/2018 18:07   Dg Sacrum/coccyx  Result Date: 01/08/2018 CLINICAL DATA:  84 year old female with progressive lower back pain with ambulation since sustaining a  fall last Saturday EXAM: SACRUM AND COCCYX - 2+ VIEW COMPARISON:  Concurrently obtained radiographs of the lumbar spine and pelvis; prior radiographs of the pelvis 06/02/2017 FINDINGS: The bones are diffusely demineralized. No evidence of acute fracture or malalignment. Lower lumbar degenerative disc disease and facet arthropathy. IMPRESSION: No definite fracture identified although sensitivity for nondisplaced fractures is significantly limited by underlying osteopenia. If clinical concern persists, recommend further evaluation with pelvic MRI. Pelvic CT scan will likely be insufficiently sensitive due to the underlying osteopenia. Electronically Signed   By: Malachy Moan M.D.   On: 01/08/2018 18:05   Ct Angio Chest Pe W And/or Wo Contrast  Result Date: 01/08/2018 CLINICAL DATA:  Shortness of breath EXAM: CT ANGIOGRAPHY CHEST WITH CONTRAST TECHNIQUE: Multidetector CT imaging of the chest was performed using the standard protocol during bolus administration of intravenous contrast. Multiplanar CT image reconstructions and MIPs were obtained to evaluate the vascular anatomy. CONTRAST:  80 mL Isovue 370 intravenous COMPARISON:  Chest x-ray 01/08/2018 FINDINGS: Cardiovascular: Satisfactory opacification of the pulmonary arteries to the segmental level. No evidence of pulmonary embolism. Nonaneurysmal aorta. Moderate severe aortic atherosclerosis. No dissection. Coronary vascular calcification. Mild cardiomegaly. No pericardial effusion Mediastinum/Nodes: Midline trachea. 11 mm hypodense nodule left lobe of thyroid. Small hiatal hernia. No significant adenopathy Lungs/Pleura: Mild emphysema. No acute consolidation or effusion. No pneumothorax Upper Abdomen: No acute abnormality. Musculoskeletal: Mild compression deformity of T2, uncertain chronicity. Review of the MIP images confirms the above findings. IMPRESSION: 1. Negative for acute pulmonary embolus or aortic dissection 2. No acute pulmonary infiltrate.   Mild emphysema 3. Age indeterminate mild compression deformity T2 Aortic Atherosclerosis (ICD10-I70.0) and Emphysema (ICD10-J43.9). Electronically Signed   By: Jasmine Pang M.D.   On: 01/08/2018 23:19   Dg Chest Port 1 View  Result Date: 01/09/2018 CLINICAL DATA:  Hypoxia. EXAM: PORTABLE CHEST 1 VIEW COMPARISON:  Yesterday. FINDINGS: Stable enlarged cardiac silhouette and tortuous and calcified thoracic aorta. The lungs remain mildly hyperexpanded with stable mild prominence of the pulmonary vasculature and interstitial markings. Previously demonstrated right lower lobe calcified granuloma. Diffuse osteopenia. IMPRESSION: Stable cardiomegaly, mild pulmonary vascular congestion and changes of COPD. Electronically Signed   By: Beckie Salts M.D.   On: 01/09/2018 15:41      Scheduled Meds: . aspirin EC  81 mg Oral Daily  . feeding supplement (ENSURE ENLIVE)  237 mL Oral BID BM  . gabapentin  300 mg Oral TID  . heparin  5,000 Units Subcutaneous Q8H  . metoprolol tartrate  25 mg Oral BID  . multivitamin with minerals  1 tablet Oral Daily  . predniSONE  40 mg Oral Q breakfast   Continuous Infusions:   LOS: 1 day    Time spent: Total of 25 minutes spent with pt, greater than 50% of which was spent in discussion of  treatment, counseling and coordination of care  Latrelle Dodrill, MD Pager: Text Page via [email or web address]   If 7PM-7AM, please contact night-coverage [email or web address] 01/10/2018, 12:06 PM   Note - This record has been created using [employer]. Chart creation errors have been sought, but may not always have been located. Such creation errors do not reflect on the standard of medical care.

## 2018-01-10 NOTE — Evaluation (Signed)
Clinical/Bedside Swallow Evaluation Patient Details  Name: Krystal Patrick MRN: [id number] Date of Birth: 1920-05-15  Today's Date: 01/10/2018 Time: SLP Start Time (ACUTE ONLY): 1725 SLP Stop Time (ACUTE ONLY): 1748 SLP Time Calculation (min) (ACUTE ONLY): 23 min  Past Medical History:  Past Medical History:  Diagnosis Date  . AAA (abdominal aortic aneurysm) (HCC)   . CAD (coronary artery disease)   . GERD (gastroesophageal reflux disease)   . Hypertension    Past Surgical History:  Past Surgical History:  Procedure Laterality Date  . CHOLECYSTECTOMY    . SMALL BOWEL REPAIR     HPI:  Patient is a 84 yr old female admittied 01/08/2018 with diagnosis of near syncopal episode and fall at home 5 days prior to admission.  PMH: AAA, HTN, CAD, GERD, depression.  CXR: stable cardiomegaly, mild pulmonary vascular congestion and stable changes of COPD.   Assessment / Plan / Recommendation Clinical Impression   Pt presents with mild risk for aspiration given deconditioning. Pt and granddaughter report change in vocal quality, which SLP notes to be mildly hoarse; suspect related to decreased breath support. Cough is strong. Pt does have immediate coughing with straw sips and larger cup sips of thin liquids, suggestive of airway compromise but adequate sensation. This is mitigated with small cup sips; there are no overt signs of aspiration with thin liquids via this method. Pt requires assistance for feeding due to visual impairments. Granddaughter present and feeding patient; pt has few teeth but is able to self-select softer solids from regular menu, which she manages quite well. Pt has been afebrile, has no history of recent pneumonia, and CXR/ CT imaging have not shown pulmonary infiltrate. I educated pt and her granddaughter re: age-related changes in swallow function, increased aspiration risk with diminished functional reserve, and strategies to mitigate risk including small bites and sips,  limiting distractions at mealtimes, upright positioning and oral care. Recommend regular diet with thin liquids, no straws, meds whole in puree with supervision/assistance for feeding. Pt may benefit from follow-up with SLP in SNF for carryover of swallow precautions.   SLP Visit Diagnosis: Dysphagia, unspecified (R13.10)    Aspiration Risk  Mild aspiration risk    Diet Recommendation Regular;Thin liquid   Liquid Administration via: No straw;Cup Medication Administration: Whole meds with puree Supervision: Staff to assist with self feeding;Full supervision/cueing for compensatory strategies Compensations: Slow rate;Small sips/bites Postural Changes: Seated upright at 90 degrees    Other  Recommendations Oral Care Recommendations: Oral care BID   Follow up Recommendations Skilled Nursing facility      Frequency and Duration min 1 x/week  1 week       Prognosis Prognosis for Safe Diet Advancement: Good      Swallow Study   General Date of Onset: 01/08/18 HPI: Patient is a 84 yr old female admittied 01/08/2018 with diagnosis of near syncopal episode and fall at home 5 days prior to admission.  PMH: AAA, HTN, CAD, GERD, depression.  CXR: stable cardiomegaly, mild pulmonary vascular congestion and stable changes of COPD. Type of Study: Bedside Swallow Evaluation Previous Swallow Assessment: Bedside swallow in January 2019 recommended regular diet and thin liquids Diet Prior to this Study: Regular;Thin liquids Temperature Spikes Noted: No Respiratory Status: Room air(has been requiring [place]) History of Recent Intubation: No Behavior/Cognition: Alert;Cooperative Oral Cavity Assessment: Within Functional Limits Oral Care Completed by SLP: No Oral Cavity - Dentition: Missing dentition Vision: Impaired for self-feeding Self-Feeding Abilities: Needs assist Patient Positioning: Upright in bed(bed tilt forward  as pt has pain with HOB elevation) Baseline Vocal Quality:  Hoarse;Breathy Volitional Cough: Strong Volitional Swallow: Able to elicit    Oral/Motor/Sensory Function Overall Oral Motor/Sensory Function: Within functional limits   Ice Chips Ice chips: Not tested   Thin Liquid Thin Liquid: Impaired Presentation: Cup;Straw Pharyngeal  Phase Impairments: Cough - Immediate;Wet Vocal Quality(suspect premature spillage)    Nectar Thick Nectar Thick Liquid: Not tested   Honey Thick Honey Thick Liquid: Not tested   Puree Puree: Within functional limits Presentation: Spoon   Solid     Solid: Within functional limits Presentation: Spoon Other Comments: soft solid     Rondel Baton, MS, CCC-SLP Speech-Language Pathologist Acute Rehabilitation Services Pager: [phone] Office: [phone]  Arlana Lindau 01/10/2018,5:54 PM

## 2018-01-11 DIAGNOSIS — M5430 Sciatica, unspecified side: Secondary | ICD-10-CM

## 2018-01-11 DIAGNOSIS — M5440 Lumbago with sciatica, unspecified side: Secondary | ICD-10-CM

## 2018-01-11 MED ORDER — TRAMADOL HCL 50 MG PO TABS: 25.0000 mg | ORAL_TABLET | Freq: Two times a day (BID) | ORAL | 0 refills | Status: DC | PRN

## 2018-01-11 MED ORDER — PREDNISONE 20 MG PO TABS: 40.0000 mg | ORAL_TABLET | Freq: Every day | ORAL | 0 refills | Status: AC

## 2018-01-11 MED ORDER — METHOCARBAMOL 750 MG PO TABS: 750.0000 mg | ORAL_TABLET | Freq: Three times a day (TID) | ORAL | Status: DC | PRN

## 2018-01-11 MED ORDER — HYDROCODONE-ACETAMINOPHEN 5-325 MG PO TABS
1.0000 | ORAL_TABLET | Freq: Four times a day (QID) | ORAL | Status: DC | PRN
  Administered 2018-01-11 (×3): 1 via ORAL
  Administered 2018-01-12: 2 via ORAL
  Filled 2018-01-11: qty 2
  Filled 2018-01-11 (×3): qty 1

## 2018-01-11 MED ORDER — POLYETHYLENE GLYCOL 3350 17 G PO PACK: 17.0000 g | PACK | Freq: Every day | ORAL | 0 refills | Status: AC | PRN

## 2018-01-11 MED ORDER — ACETAMINOPHEN 325 MG PO TABS: 650.0000 mg | ORAL_TABLET | Freq: Four times a day (QID) | ORAL | Status: DC | PRN

## 2018-01-11 MED ORDER — GABAPENTIN 300 MG PO CAPS: 300.0000 mg | ORAL_CAPSULE | Freq: Three times a day (TID) | ORAL | Status: DC

## 2018-01-11 NOTE — Clinical Social Work Note (Signed)
Clinical Social Work Assessment  Patient Details  Name: Krystal Patrick MRN: [id number] Date of Birth: [birth year]  Date of referral:  01/10/18               Reason for consult:  Facility Placement                Permission sought to share information with:  Facility Art therapist granted to share information::  Yes, Verbal Permission Granted  Name::     Kristen Cardinal, Anderson Malta   Agency::  SNF  Relationship::  Daughter, Administrator Information:  [phone]  Housing/Transportation Living arrangements for the past 2 months:  Single Family Home Source of Information:  Patient, Adult Children Patient Interpreter Needed:  None Criminal Activity/Legal Involvement Pertinent to Current Situation/Hospitalization:  No - Comment as needed Significant Relationships:  Adult Children, Other Family Members Lives with:  Adult Children Do you feel safe going back to the place where you live?  No Need for family participation in patient care:  Yes (Comment)  Care giving concerns:  Patient lives with daughter, Johnny Bridge, who just had hip replacement surgery. Family reports patient typically transfers by herself and ambulates with a walker. Family reports patient does not do much at home but is typically able to do basic needs (transfers, toileting, brushing hair). Family has caregiver who comes once a week to help with bathing. Due to patient's daughter's recent hip surgery, support would be limited if patient were to return home.   Social Worker assessment / plan:  CSW met with patient and family at bedside to discuss discharge plan to SNF. Patient currently lives with daughter, Johnny Bridge, in [place]. Patient was admitted after a fall where she attempted to sit on her walker and slipped. Patient's daughter helps patient throughout the day but patient is typically able to take care of her basic needs.   Patient's family is supportive and granddaughter, Anderson Malta, is  currently in town from [place].   CSW will complete FL2 and send out referrals. Family prefers [facility] SNF due to proximity to home.  Employment status:  Retired Forensic scientist:  Medicare, Other (Comment Required)([employer]) PT Recommendations:  [place] / Referral to community resources:  [place]  Patient/Family's Response to care:  Patient and family are grateful to hospital staff's care and expressed appreciation of the hospital. They are hoping to go to [facility] for rehab.  Patient/Family's Understanding of and Emotional Response to Diagnosis, Current Treatment, and Prognosis:  Patient and family understand recommendation for SNF. They are open to [facility] but want to review any others if that is necessary.   Emotional Assessment Appearance:  Appears stated age Attitude/Demeanor/Rapport:    Affect (typically observed):  Accepting, Appropriate, Calm Orientation:  Oriented to Self, Oriented to Place, Oriented to Situation, Oriented to  Time Alcohol / Substance use:  Not Applicable Psych involvement (Current and /or in the community):  No (Comment)  Discharge Needs  Concerns to be addressed:  Care Coordination Readmission within the last 30 days:  No Current discharge risk:  Physical Impairment Barriers to Discharge:      Pricilla Holm, [place] 01/11/2018, 11:00 AM

## 2018-01-11 NOTE — NC FL2 (Signed)
MEDICAID FL2 LEVEL OF CARE SCREENING TOOL     IDENTIFICATION  Patient Name: Gwenneth Jarnagin Birthdate: 03-08-1921 Sex: female Admission Date (Current Location): 01/08/2018  [facility] and [place] Number:  Producer, television/film/video and Address:  [facility],  [address] [place], [place] [address]      Provider Number: [id number]  Attending Physician Name and Address:  Randel Pigg, Dorma Russell, MD  Relative Name and Phone Number:  Judie Bonus: [phone]    Current Level of Care: Hospital Recommended Level of Care: Skilled Nursing Facility Prior Approval Number:    Date Approved/Denied:   PASRR Number: [id number]  Discharge Plan: SNF    Current Diagnoses: Patient Active Problem List   Diagnosis Date Noted  . Fall at home, initial encounter 01/09/2018  . Near syncope 01/09/2018  . Syncope 01/09/2018  . Pressure injury of skin 05/31/2017  . UTI (urinary tract infection) 05/30/2017  . Acute metabolic encephalopathy 05/30/2017  . Hypertensive urgency 05/30/2017  . Depression 05/30/2017  . Hypertension   . CAD (coronary artery disease)   . GERD (gastroesophageal reflux disease)   . AAA (abdominal aortic aneurysm) without rupture (HCC) 09/06/2014    Orientation RESPIRATION BLADDER Height & Weight     Self, Time, Situation, Place  Normal External catheter Weight: 125 lb 14.1 oz (57.1 kg) Height:  5\' 2"  (157.5 cm)  BEHAVIORAL SYMPTOMS/MOOD NEUROLOGICAL BOWEL NUTRITION STATUS      Continent Diet(Regular)  AMBULATORY STATUS COMMUNICATION OF NEEDS Skin   Extensive Assist Verbally Normal                       Personal Care Assistance Level of Assistance  Bathing, Feeding, Dressing Bathing Assistance: Maximum assistance Feeding assistance: Limited assistance Dressing Assistance: Maximum assistance     Functional Limitations Info  Sight, Hearing, Speech Sight Info: Impaired Hearing Info: Adequate Speech Info: Adequate    SPECIAL CARE FACTORS  FREQUENCY  PT (By licensed PT), OT (By licensed OT)     PT Frequency: 5x/week OT Frequency: 5x/week            Contractures Contractures Info: Not present    Additional Factors Info  Code Status, Allergies Code Status Info: Full Allergies Info: PENICILLINS           Current Medications (01/11/2018):  This is the current hospital active medication list Current Facility-Administered Medications  Medication Dose Route Frequency Provider Last Rate Last Dose  . acetaminophen (TYLENOL) tablet 650 mg  650 mg Oral Q6H PRN Bodenheimer, Charles A, NP      . albuterol (PROVENTIL) (2.5 MG/3ML) 0.083% nebulizer solution 2.5 mg  2.5 mg Nebulization Q4H PRN Randel Pigg, Dorma Russell, MD   2.5 mg at 01/10/18 1318  . aspirin EC tablet 81 mg  81 mg Oral Daily Emokpae, Ejiroghene E, MD   81 mg at 01/11/18 1112  . feeding supplement (ENSURE ENLIVE) (ENSURE ENLIVE) liquid 237 mL  237 mL Oral BID BM Emokpae, Ejiroghene E, MD   237 mL at 01/09/18 1447  . gabapentin (NEURONTIN) capsule 300 mg  300 mg Oral TID Randel Pigg, Dorma Russell, MD   300 mg at 01/11/18 1113  . heparin injection 5,000 Units  5,000 Units Subcutaneous Q8H Emokpae, Ejiroghene E, MD   5,000 Units at 01/11/18 0502  . HYDROcodone-acetaminophen (NORCO/VICODIN) 5-325 MG per tablet 1-2 tablet  1-2 tablet Oral Q6H PRN Macon Large, NP   1 tablet at 01/11/18 0617  . MEDLINE mouth rinse  15 mL  Mouth Rinse BID Randel Pigg, Dorma Russell, MD   15 mL at 01/11/18 1118  . methocarbamol (ROBAXIN) tablet 750 mg  750 mg Oral Q8H PRN Randel Pigg, Dorma Russell, MD   750 mg at 01/11/18 0421  . metoprolol tartrate (LOPRESSOR) tablet 25 mg  25 mg Oral BID Emokpae, Ejiroghene E, MD   25 mg at 01/11/18 1113  . multivitamin with minerals tablet 1 tablet  1 tablet Oral Daily Randel Pigg, Dorma Russell, MD   1 tablet at 01/11/18 1114  . ondansetron (ZOFRAN) tablet 4 mg  4 mg Oral Q6H PRN Emokpae, Ejiroghene E, MD       Or  . ondansetron (ZOFRAN) injection 4 mg  4 mg Intravenous Q6H  PRN Emokpae, Ejiroghene E, MD      . polyethylene glycol (MIRALAX / GLYCOLAX) packet 17 g  17 g Oral Daily PRN Emokpae, Ejiroghene E, MD      . predniSONE (DELTASONE) tablet 40 mg  40 mg Oral Q breakfast Randel Pigg, Dorma Russell, MD   40 mg at 01/11/18 9604     Discharge Medications: Please see discharge summary for a list of discharge medications.  Relevant Imaging Results:  Relevant Lab Results:   Additional Information SSN: [id number]  Enid Cutter, [place]

## 2018-01-11 NOTE — Discharge Summary (Addendum)
Physician Discharge Summary  Krystal Patrick  ZOX:[id number]  DOB: Apr 19, 1921  DOA: 01/08/2018 PCP: Krystal Mask, MD  Admit date: 01/08/2018 Discharge date: 01/12/2018  Admitted From: Home  Disposition: SNF   Recommendations for Outpatient Follow-up:  1. Follow up with SNF provider at earliest convenience 2. Please obtain BMP/CBC in one week to renal function and hemoglobin 3. Follow-up SLP evaluation as outpatient  Discharge Condition: Stable CODE STATUS: Full code Diet recommendation: Heart Healthy   Brief/Interim Summary: For full details see H&P/Progress note, but in brief, Krystal Patrick is a 84 year old female with medical history significant for AAA, hypertension, CAD, depression who presented to the emergency department complaining of hip/lumbar pain and ability to walk.  Patient sustaining a mechanical fall 5 days prior to admission subsequently was doing well however developed severe pain 2 days prior to admission.  Upon ED evaluation images were all negative for acute abnormality of definite definitive fracture however when attempted to walk patient was unable to do so and desaturated to low 80s.  Patient had a near syncopal episode witnessed in the ED.  Patient was admitted with working diagnosis of presyncope and AKI.  Subjective: Patient seen and examined, she is doing much better, continues to improve however pain still on and off.  No other concerns.  Remains off oxygen.  Discharge Diagnoses/Hospital Course:  Near syncope Patient did not lose consciousness, EKG with prolonged QTC at 562, troponin is negative. She was found to be mildly hypoxic with ambulation, chest x-ray was negative and CTA negative for PE. Mild emphysema on CTA of the chest.  Feel to be vasovagal episode related to pain. Patient was also dehydrated which could be contributed to presyncopal episode. ECHO with no abnormalities. No recurrent episodes, normal orthostatics.   Lower back pain   Felt to be related to fall, patient was sitting on a rigid doughnut, likely contributed to sciatic pain. Also having muscle spasms. X-rays were negative however patient with osteoporosis/osteopenia so unable to rule out hairline fractures.  Patient has improved with steroids, gabapentin and Robaxin.  Will add tramadol as needed.  Continue prednisone to complete 5 days course.  Continue Robaxin as needed and gabapentin 3 times daily.  Therapy at SNF.  Hypoxia - resolved  Unclear etiology at this time, CXR negative, but changes consistent with COPD, CTA negative for PE however signs of emphysema.  History of smoking.  Asymptomatic, not complaining of shortness of breath.  Hypoxia is mainly on exertion ?  Poor pulmonary reserve from emphysema. Albuterol as needed. Continue to monitor   AKI - resolved  Felt to be dehydration Encourage oral hydration, avoid nephrotoxic agents   Prolonged QTC QTc Normalized  Continue to hold citalopram   Hypertension BP stable, continue current regimen  All other chronic medical condition were stable during the hospitalization.  Patient was seen by physical therapy, recommending SNF for SRT On the day of the discharge the patient's vitals were stable, and no other acute medical condition were reported by patient. the patient was felt safe to be discharge to SNF  Discharge Instructions  You were cared for by a hospitalist during your hospital stay. If you have any questions about your discharge medications or the care you received while you were in the hospital after you are discharged, you can call the unit and asked to speak with the hospitalist on call if the hospitalist that took care of you is not available. Once you are discharged, your primary care physician will handle any  further medical issues. Please note that NO REFILLS for any discharge medications will be authorized once you are discharged, as it is imperative that you return to your primary care  physician (or establish a relationship with a primary care physician if you do not have one) for your aftercare needs so that they can reassess your need for medications and monitor your lab values.   Discharge Instructions    Call MD for:  difficulty breathing, headache or visual disturbances   Complete by:  As directed    Call MD for:  extreme fatigue   Complete by:  As directed    Call MD for:  hives   Complete by:  As directed    Call MD for:  persistant dizziness or light-headedness   Complete by:  As directed    Call MD for:  persistant nausea and vomiting   Complete by:  As directed    Call MD for:  redness, tenderness, or signs of infection (pain, swelling, redness, odor or green/yellow discharge around incision site)   Complete by:  As directed    Call MD for:  severe uncontrolled pain   Complete by:  As directed    Call MD for:  temperature >100.4   Complete by:  As directed    Diet - low sodium heart healthy   Complete by:  As directed    Increase activity slowly   Complete by:  As directed      Allergies as of 01/11/2018      Reactions   Penicillins Rash   Has patient had a PCN reaction causing immediate rash, facial/tongue/throat swelling, SOB or lightheadedness with hypotension: Unknown Has patient had a PCN reaction causing severe rash involving mucus membranes or skin necrosis: Unknown Has patient had a PCN reaction that required hospitalization: No Has patient had a PCN reaction occurring within the last 10 years: No If all of the above answers are "NO", then may proceed with Cephalosporin use.      Medication List    STOP taking these medications   citalopram 10 MG tablet Commonly known as:  CELEXA   diclofenac sodium 1 % Gel Commonly known as:  VOLTAREN   doxycycline 100 MG tablet Commonly known as:  VIBRA-TABS     TAKE these medications   acetaminophen 650 MG CR tablet Commonly known as:  TYLENOL Take 650 mg by mouth every 8 (eight) hours as needed  for pain.   amLODipine 5 MG tablet Commonly known as:  NORVASC Take 10 mg by mouth daily.   ARTHRITIS PAIN RELIEF 0.025 % cream Generic drug:  capsaicin Apply 1 application topically daily.   aspirin EC 81 MG tablet Take 81 mg by mouth daily.   furosemide 20 MG tablet Commonly known as:  LASIX Take 20 mg by mouth daily. Monday, wednesday, and friday   gabapentin 300 MG capsule Commonly known as:  NEURONTIN Take 1 capsule (300 mg total) by mouth 3 (three) times daily.   lisinopril 40 MG tablet Commonly known as:  PRINIVIL,ZESTRIL Take 40 mg by mouth daily.   Melatonin 5 MG Tabs Take 5 mg by mouth at bedtime as needed (sleep).   methocarbamol 750 MG tablet Commonly known as:  ROBAXIN Take 1 tablet (750 mg total) by mouth every 8 (eight) hours as needed for muscle spasms.   metoprolol tartrate 25 MG tablet Commonly known as:  LOPRESSOR Take 25 mg by mouth 2 (two) times daily.   polyethylene glycol packet Commonly known  as:  MIRALAX / GLYCOLAX Take 17 g by mouth daily as needed for mild constipation.   predniSONE 20 MG tablet Commonly known as:  DELTASONE Take 2 tablets (40 mg total) by mouth daily with breakfast for 3 days. Start taking on:  01/12/2018   traMADol 50 MG tablet Commonly known as:  ULTRAM Take 0.5 tablets (25 mg total) by mouth every 12 (twelve) hours as needed for moderate pain.   vitamin B-12 500 MCG tablet Commonly known as:  CYANOCOBALAMIN Take 500 mcg by mouth daily.   Vitamin D3 10000 units Tabs Take 1,000 Units by mouth daily.      Follow-up Information    Krystal Mask, MD. Schedule an appointment as soon as possible for a visit in 1 week(s).   Specialty:  Family Medicine Why:  Hospital follow up  Contact information: [address] [place] [address] [phone]          Allergies  Allergen Reactions  . Penicillins Rash    Has patient had a PCN reaction causing immediate rash, facial/tongue/throat swelling,  SOB or lightheadedness with hypotension: Unknown Has patient had a PCN reaction causing severe rash involving mucus membranes or skin necrosis: Unknown Has patient had a PCN reaction that required hospitalization: No Has patient had a PCN reaction occurring within the last 10 years: No If all of the above answers are "NO", then may proceed with Cephalosporin use.     Consultations:   Procedures/Studies: Dg Chest 2 View  Result Date: 01/08/2018 CLINICAL DATA:  Weakness. EXAM: CHEST - 2 VIEW COMPARISON:  April 08, 2016 FINDINGS: The heart size and mediastinal contours are stable. The heart size is enlarged. The aorta is tortuous. There is no focal infiltrate, pulmonary edema, or pleural effusion. Calcified granulomas are identified bilaterally. The visualized skeletal structures are stable. IMPRESSION: No active cardiopulmonary disease. Electronically Signed   By: Sherian Rein M.D.   On: 01/08/2018 19:46   Dg Lumbar Spine Complete  Result Date: 01/08/2018 CLINICAL DATA:  84 year old female with progressive pain with ambulation after suffering a fall last Saturday. EXAM: LUMBAR SPINE - COMPLETE 4+ VIEW COMPARISON:  Prior radiographs of the lumbar spine 06/02/2017 FINDINGS: Enlarging abdominal aortic aneurysm now measuring up to 4.1 cm compared to 3.9 cm previously. No evidence of acute fracture or malalignment. The vertebral body heights are maintained. Stable degenerative disc disease and lower lumbar facet arthropathy. Mild grade 1 anterolisthesis of L4 on L5 is similar compared to prior. Surgical clips in the right upper quadrant consistent with prior cholecystectomy. The bones are diffusely demineralized. IMPRESSION: 1. No acute fracture or malalignment. 2. Slight interval enlargement in abdominal aortic aneurysm now measuring up to 4.1 cm compared to 3.9 cm in January of 2019. 3. Lower lumbar degenerative disc disease and facet arthropathy. 4. Diffuse demineralization. Electronically Signed    By: Malachy Moan M.D.   On: 01/08/2018 18:03   Dg Pelvis 1-2 Views  Result Date: 01/08/2018 CLINICAL DATA:  84 year old female with progressive pain during ambulation since suffering a fall last Saturday. EXAM: PELVIS - 1-2 VIEW COMPARISON:  Concurrently obtained radiographs of the lumbar spine and sacrum; prior pelvic radiographs 06/02/2017 FINDINGS: There is no evidence of pelvic fracture or diastasis. No pelvic bone lesions are seen. The bones are diffusely demineralized. Atherosclerotic calcifications noted in the bilateral femoral arteries. IMPRESSION: No definite acute fracture identified. Please note that evaluation for nondisplaced fractures is limited by the underlying osteopenia. If clinical concern persists, further imaging with MRI  would be recommended as CT scan would also likely be insensitive. Electronically Signed   By: Malachy Moan M.D.   On: 01/08/2018 18:07   Dg Sacrum/coccyx  Result Date: 01/08/2018 CLINICAL DATA:  84 year old female with progressive lower back pain with ambulation since sustaining a fall last Saturday EXAM: SACRUM AND COCCYX - 2+ VIEW COMPARISON:  Concurrently obtained radiographs of the lumbar spine and pelvis; prior radiographs of the pelvis 06/02/2017 FINDINGS: The bones are diffusely demineralized. No evidence of acute fracture or malalignment. Lower lumbar degenerative disc disease and facet arthropathy. IMPRESSION: No definite fracture identified although sensitivity for nondisplaced fractures is significantly limited by underlying osteopenia. If clinical concern persists, recommend further evaluation with pelvic MRI. Pelvic CT scan will likely be insufficiently sensitive due to the underlying osteopenia. Electronically Signed   By: Malachy Moan M.D.   On: 01/08/2018 18:05   Ct Angio Chest Pe W And/or Wo Contrast  Result Date: 01/08/2018 CLINICAL DATA:  Shortness of breath EXAM: CT ANGIOGRAPHY CHEST WITH CONTRAST TECHNIQUE: Multidetector CT imaging  of the chest was performed using the standard protocol during bolus administration of intravenous contrast. Multiplanar CT image reconstructions and MIPs were obtained to evaluate the vascular anatomy. CONTRAST:  80 mL Isovue 370 intravenous COMPARISON:  Chest x-ray 01/08/2018 FINDINGS: Cardiovascular: Satisfactory opacification of the pulmonary arteries to the segmental level. No evidence of pulmonary embolism. Nonaneurysmal aorta. Moderate severe aortic atherosclerosis. No dissection. Coronary vascular calcification. Mild cardiomegaly. No pericardial effusion Mediastinum/Nodes: Midline trachea. 11 mm hypodense nodule left lobe of thyroid. Small hiatal hernia. No significant adenopathy Lungs/Pleura: Mild emphysema. No acute consolidation or effusion. No pneumothorax Upper Abdomen: No acute abnormality. Musculoskeletal: Mild compression deformity of T2, uncertain chronicity. Review of the MIP images confirms the above findings. IMPRESSION: 1. Negative for acute pulmonary embolus or aortic dissection 2. No acute pulmonary infiltrate.  Mild emphysema 3. Age indeterminate mild compression deformity T2 Aortic Atherosclerosis (ICD10-I70.0) and Emphysema (ICD10-J43.9). Electronically Signed   By: Jasmine Pang M.D.   On: 01/08/2018 23:19   Dg Chest Port 1 View  Result Date: 01/09/2018 CLINICAL DATA:  Hypoxia. EXAM: PORTABLE CHEST 1 VIEW COMPARISON:  Yesterday. FINDINGS: Stable enlarged cardiac silhouette and tortuous and calcified thoracic aorta. The lungs remain mildly hyperexpanded with stable mild prominence of the pulmonary vasculature and interstitial markings. Previously demonstrated right lower lobe calcified granuloma. Diffuse osteopenia. IMPRESSION: Stable cardiomegaly, mild pulmonary vascular congestion and changes of COPD. Electronically Signed   By: Beckie Salts M.D.   On: 01/09/2018 15:41    Discharge Exam: Vitals:   01/10/18 2020 01/11/18 0348  BP: (!) 159/79 (!) 153/54  Pulse: 78 67  Resp: 18 16   Temp: 98.3 F (36.8 C) 97.8 F (36.6 C)  SpO2: 94% 92%   Vitals:   01/10/18 1246 01/10/18 1318 01/10/18 2020 01/11/18 0348  BP: (!) 141/91  (!) 159/79 (!) 153/54  Pulse: 70  78 67  Resp:   18 16  Temp: 98.8 F (37.1 C)  98.3 F (36.8 C) 97.8 F (36.6 C)  TempSrc: Oral  Oral Oral  SpO2: 93% 90% 94% 92%  Weight:      Height:        General: Pt is alert, awake, not in acute distress Cardiovascular: RRR, S1/S2 +, no rubs, no gallops Respiratory: CTA bilaterally, no wheezing, no rhonchi Abdominal: Soft, NT, ND, bowel sounds + Extremities: no edema, no cyanosis   The results of significant diagnostics from this hospitalization (including imaging, microbiology, ancillary and laboratory)  are listed below for reference.     Microbiology: No results found for this or any previous visit (from the past 240 hour(s)).   Labs: BNP (last 3 results) Recent Labs    01/08/18 1941  BNP 109.6*   Basic Metabolic Panel: Recent Labs  Lab 01/08/18 1635 01/09/18 0146 01/10/18 0515  NA 139 140 137  K 4.8 4.4 4.4  CL 100 105 105  CO2 26 25 24   GLUCOSE 127* 149* 125*  BUN 24* 19 17  CREATININE 1.12* 0.81 0.75  CALCIUM 9.5 8.5* 8.8*  MG  --  2.0 2.2   Liver Function Tests: No results for input(s): AST, ALT, ALKPHOS, BILITOT, PROT, ALBUMIN in the last 168 hours. No results for input(s): LIPASE, AMYLASE in the last 168 hours. No results for input(s): AMMONIA in the last 168 hours. CBC: Recent Labs  Lab 01/08/18 1635 01/10/18 0515  WBC 14.7* 12.4*  NEUTROABS  --  10.7*  HGB 14.9 13.1  HCT 45.4 40.3  MCV 91.9 92.4  PLT 340 271   Cardiac Enzymes: Recent Labs  Lab 01/08/18 1941 01/09/18 0146 01/09/18 0733  TROPONINI <0.03 <0.03 0.03*   BNP: Invalid input(s): POCBNP CBG: No results for input(s): GLUCAP in the last 168 hours. D-Dimer No results for input(s): DDIMER in the last 72 hours. Hgb A1c No results for input(s): HGBA1C in the last 72 hours. Lipid Profile No  results for input(s): CHOL, HDL, LDLCALC, TRIG, CHOLHDL, LDLDIRECT in the last 72 hours. Thyroid function studies No results for input(s): TSH, T4TOTAL, T3FREE, THYROIDAB in the last 72 hours.  Invalid input(s): FREET3 Anemia work up No results for input(s): VITAMINB12, FOLATE, FERRITIN, TIBC, IRON, RETICCTPCT in the last 72 hours. Urinalysis    Component Value Date/Time   COLORURINE YELLOW 01/08/2018 2021   APPEARANCEUR CLEAR 01/08/2018 2021   LABSPEC 1.011 01/08/2018 2021   PHURINE 5.0 01/08/2018 2021   GLUCOSEU NEGATIVE 01/08/2018 2021   HGBUR NEGATIVE 01/08/2018 2021   BILIRUBINUR NEGATIVE 01/08/2018 2021   KETONESUR NEGATIVE 01/08/2018 2021   PROTEINUR NEGATIVE 01/08/2018 2021   NITRITE NEGATIVE 01/08/2018 2021   LEUKOCYTESUR NEGATIVE 01/08/2018 2021   Sepsis Labs Invalid input(s): PROCALCITONIN,  WBC,  LACTICIDVEN Microbiology No results found for this or any previous visit (from the past 240 hour(s)).  Time coordinating discharge: 35 minutes  SIGNED:  Latrelle Dodrill, MD  [facility] 01/11/2018, 11:52 AM  Pager please text page via  [email or web address]  Note - This record has been created using [employer]. Chart creation errors have been sought, but may not always have been located. Such creation errors do not reflect on the standard of medical care.

## 2018-01-12 NOTE — Care Management Note (Signed)
Case Management Note  Patient Details  Name: Krystal Patrick MRN: [id number] Date of Birth: 1920-11-27  Subjective/Objective:  D/c SNF today-CSW already following.                  Action/Plan:d/c SNF.   Expected Discharge Date:  01/12/18               Expected Discharge Plan:  Skilled Nursing Facility  In-House Referral:  Clinical Social Work  Discharge planning Services  CM Consult  Post Acute Care Choice:    Choice offered to:     DME Arranged:    DME Agency:     HH Arranged:    HH Agency:     Status of Service:  Completed, signed off  If discussed at [employer], dates discussed:    Additional Comments:  Lanier Clam, RN 01/12/2018, 11:46 AM

## 2018-01-12 NOTE — Clinical Social Work Placement (Signed)
Patient received and accepted bed offer at [facility] SN[facility]. Facility aware of patient's discharge and confirmed bed offer (patient now has 3 night qualifying stay).PTAR contacted, patient's family notified. Patient's RN can call report to [phone] Room 203, packet complete. CSW signing off, no other needs identified at this time.  CLINICAL SOCIAL WORK PLACEMENT  NOTE  Date:  01/12/2018  Patient Details  Name: Krystal Patrick MRN: [id number] Date of Birth: May 29, 1920  Clinical Social Work is seeking post-discharge placement for this patient at the Skilled  Nursing Facility level of care (*CSW will initial, date and re-position this form in  chart as items are completed):  Yes   Patient/family provided with [facility] Clinical Social Work Department's list of facilities offering this level of care within the geographic area requested by the patient (or if unable, by the patient's family).  Yes   Patient/family informed of their freedom to choose among providers that offer the needed level of care, that participate in Medicare, Medicaid or managed care program needed by the patient, have an available bed and are willing to accept the patient.  Yes   Patient/family informed of [facility]'s ownership interest in [facility] and [facility], as well as of the fact that they are under no obligation to receive care at these facilities.  PASRR submitted to EDS on       PASRR number received on 01/11/18     Existing PASRR number confirmed on       FL2 transmitted to all facilities in geographic area requested by pt/family on 01/11/18     FL2 transmitted to all facilities within larger geographic area on       Patient informed that his/her managed care company has contracts with or will negotiate with certain facilities, including the following:        Yes   Patient/family informed of bed offers received.  Patient chooses bed at [facility]     Physician recommends  and patient chooses bed at      Patient to be transferred to [facility] on 01/12/18.  Patient to be transferred to facility by PTAR     Patient family notified on 01/12/18 of transfer.  Name of family member notified:  [facility]     PHYSICIAN       Additional Comment:    _______________________________________________ Antionette Poles, LCSW 01/12/2018, 2:16 PM

## 2018-01-12 NOTE — Progress Notes (Signed)
Gave report to transfer care to [facility] LPN at [facility].  Patient currently stable with no complaints. Judyann Munson, RN 01/12/2018 2:20 PM

## 2018-01-12 NOTE — Progress Notes (Addendum)
CSW following to assist with discharge planning to SNF. Patient's family interested in [facility]F due to close proximity to their home. 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   CSW followed up with [facility] to confirm bed offer. Staff confirmed bed offer and agreed to review patient's [employer] SNF benefits and contact CSW with an update.  CSW contacted patient's daughter/granddaughter and provided update. Patient's daughter reported that they may be able to cover some out of pocket expense. CSW agreed to contact patient's family with an update after [facility] calls and provides coverage details.  CSW will continue to follow and assist with discharge planning.  10:07AM CSW notified by [facility] that patient's tricare will not cover SNF stay because it is a secondary insurance. Staff reported that they are able to accept patient under private pay.  CSW contacted patient's daughter Judie Bonus [phone]) and provided update. Patient's daughter reported that she is "furious" and reported that she is waiting to speak with patient's MD and was reaching out to Medicare. Patient's daughter requested that CSW inquire if patient is able to transition to [facility] under private pay then transition to Medicare coverage after she resolves this issue. CSW agreed to inquire to [facility] and provide update to patient's daughter.   Celso Sickle, [place] Clinical Social Worker [facility] Cell#: [phone]

## 2018-01-12 NOTE — Care Management Important Message (Signed)
Important Message  Patient Details  Name: Layann Costabile MRN: [id number] Date of Birth: Dec 09, 1920   Medicare Important Message Given:  Yes    Caren Macadam 01/12/2018, 12:52 PMImportant Message  Patient Details  Name: Vina Petrauskas MRN: [id number] Date of Birth: [birth year]   Medicare Important Message Given:  Yes    Caren Macadam 01/12/2018, 12:52 PM

## 2018-01-22 ENCOUNTER — Other Ambulatory Visit (HOSPITAL_COMMUNITY): Payer: Self-pay | Admitting: Internal Medicine

## 2018-01-22 DIAGNOSIS — R102 Pelvic and perineal pain: Secondary | ICD-10-CM

## 2018-01-22 DIAGNOSIS — D72829 Elevated white blood cell count, unspecified: Secondary | ICD-10-CM

## 2018-01-22 DIAGNOSIS — M7918 Myalgia, other site: Secondary | ICD-10-CM

## 2018-01-26 ENCOUNTER — Other Ambulatory Visit (HOSPITAL_COMMUNITY): Payer: Self-pay | Admitting: Pediatric Surgery

## 2018-01-26 ENCOUNTER — Emergency Department (HOSPITAL_COMMUNITY): Payer: Medicare Other

## 2018-01-26 ENCOUNTER — Other Ambulatory Visit: Payer: Self-pay | Admitting: Pediatric Surgery

## 2018-01-26 ENCOUNTER — Encounter (HOSPITAL_COMMUNITY): Payer: Self-pay

## 2018-01-26 ENCOUNTER — Inpatient Hospital Stay (HOSPITAL_COMMUNITY)
Admission: EM | Admit: 2018-01-26 | Discharge: 2018-02-07 | DRG: 377 | Disposition: A | Discharge status: Skilled Nursing Facility | Payer: Medicare Other | Source: Skilled Nursing Facility | Attending: Family Medicine | Admitting: Family Medicine

## 2018-01-26 DIAGNOSIS — I959 Hypotension, unspecified: Secondary | ICD-10-CM | POA: Diagnosis present

## 2018-01-26 DIAGNOSIS — Z7982 Long term (current) use of aspirin: Secondary | ICD-10-CM

## 2018-01-26 DIAGNOSIS — R6510 Systemic inflammatory response syndrome (SIRS) of non-infectious origin without acute organ dysfunction: Secondary | ICD-10-CM | POA: Diagnosis present

## 2018-01-26 DIAGNOSIS — E871 Hypo-osmolality and hyponatremia: Secondary | ICD-10-CM | POA: Diagnosis present

## 2018-01-26 DIAGNOSIS — D62 Acute posthemorrhagic anemia: Secondary | ICD-10-CM | POA: Diagnosis present

## 2018-01-26 DIAGNOSIS — K5730 Diverticulosis of large intestine without perforation or abscess without bleeding: Secondary | ICD-10-CM | POA: Diagnosis present

## 2018-01-26 DIAGNOSIS — I272 Pulmonary hypertension, unspecified: Secondary | ICD-10-CM | POA: Diagnosis present

## 2018-01-26 DIAGNOSIS — I471 Supraventricular tachycardia: Secondary | ICD-10-CM | POA: Diagnosis not present

## 2018-01-26 DIAGNOSIS — I493 Ventricular premature depolarization: Secondary | ICD-10-CM | POA: Diagnosis not present

## 2018-01-26 DIAGNOSIS — Z79899 Other long term (current) drug therapy: Secondary | ICD-10-CM

## 2018-01-26 DIAGNOSIS — D72829 Elevated white blood cell count, unspecified: Secondary | ICD-10-CM

## 2018-01-26 DIAGNOSIS — K264 Chronic or unspecified duodenal ulcer with hemorrhage: Secondary | ICD-10-CM | POA: Diagnosis not present

## 2018-01-26 DIAGNOSIS — R0602 Shortness of breath: Secondary | ICD-10-CM

## 2018-01-26 DIAGNOSIS — I2510 Atherosclerotic heart disease of native coronary artery without angina pectoris: Secondary | ICD-10-CM | POA: Diagnosis present

## 2018-01-26 DIAGNOSIS — R109 Unspecified abdominal pain: Secondary | ICD-10-CM

## 2018-01-26 DIAGNOSIS — E875 Hyperkalemia: Secondary | ICD-10-CM | POA: Diagnosis present

## 2018-01-26 DIAGNOSIS — Z880 Allergy status to penicillin: Secondary | ICD-10-CM

## 2018-01-26 DIAGNOSIS — M7918 Myalgia, other site: Secondary | ICD-10-CM

## 2018-01-26 DIAGNOSIS — F329 Major depressive disorder, single episode, unspecified: Secondary | ICD-10-CM | POA: Diagnosis present

## 2018-01-26 DIAGNOSIS — I5033 Acute on chronic diastolic (congestive) heart failure: Secondary | ICD-10-CM | POA: Diagnosis not present

## 2018-01-26 DIAGNOSIS — E860 Dehydration: Secondary | ICD-10-CM | POA: Diagnosis present

## 2018-01-26 DIAGNOSIS — I714 Abdominal aortic aneurysm, without rupture, unspecified: Secondary | ICD-10-CM | POA: Diagnosis present

## 2018-01-26 DIAGNOSIS — R4182 Altered mental status, unspecified: Secondary | ICD-10-CM

## 2018-01-26 DIAGNOSIS — R1310 Dysphagia, unspecified: Secondary | ICD-10-CM | POA: Diagnosis present

## 2018-01-26 DIAGNOSIS — G92 Toxic encephalopathy: Secondary | ICD-10-CM | POA: Diagnosis present

## 2018-01-26 DIAGNOSIS — F419 Anxiety disorder, unspecified: Secondary | ICD-10-CM | POA: Diagnosis present

## 2018-01-26 DIAGNOSIS — I110 Hypertensive heart disease with heart failure: Secondary | ICD-10-CM | POA: Diagnosis present

## 2018-01-26 DIAGNOSIS — K219 Gastro-esophageal reflux disease without esophagitis: Secondary | ICD-10-CM | POA: Diagnosis present

## 2018-01-26 DIAGNOSIS — Z833 Family history of diabetes mellitus: Secondary | ICD-10-CM

## 2018-01-26 DIAGNOSIS — G9341 Metabolic encephalopathy: Secondary | ICD-10-CM | POA: Diagnosis present

## 2018-01-26 DIAGNOSIS — X58XXXA Exposure to other specified factors, initial encounter: Secondary | ICD-10-CM | POA: Diagnosis present

## 2018-01-26 DIAGNOSIS — Z9049 Acquired absence of other specified parts of digestive tract: Secondary | ICD-10-CM

## 2018-01-26 DIAGNOSIS — Z8249 Family history of ischemic heart disease and other diseases of the circulatory system: Secondary | ICD-10-CM

## 2018-01-26 DIAGNOSIS — K210 Gastro-esophageal reflux disease with esophagitis: Secondary | ICD-10-CM | POA: Diagnosis present

## 2018-01-26 DIAGNOSIS — Z79890 Hormone replacement therapy (postmenopausal): Secondary | ICD-10-CM

## 2018-01-26 DIAGNOSIS — Z09 Encounter for follow-up examination after completed treatment for conditions other than malignant neoplasm: Secondary | ICD-10-CM

## 2018-01-26 DIAGNOSIS — T426X5A Adverse effect of other antiepileptic and sedative-hypnotic drugs, initial encounter: Secondary | ICD-10-CM | POA: Diagnosis present

## 2018-01-26 DIAGNOSIS — M5440 Lumbago with sciatica, unspecified side: Secondary | ICD-10-CM | POA: Diagnosis present

## 2018-01-26 DIAGNOSIS — I4819 Other persistent atrial fibrillation: Secondary | ICD-10-CM | POA: Diagnosis not present

## 2018-01-26 DIAGNOSIS — R933 Abnormal findings on diagnostic imaging of other parts of digestive tract: Secondary | ICD-10-CM

## 2018-01-26 DIAGNOSIS — M549 Dorsalgia, unspecified: Secondary | ICD-10-CM

## 2018-01-26 DIAGNOSIS — K6289 Other specified diseases of anus and rectum: Secondary | ICD-10-CM | POA: Diagnosis present

## 2018-01-26 DIAGNOSIS — J9601 Acute respiratory failure with hypoxia: Secondary | ICD-10-CM | POA: Diagnosis not present

## 2018-01-26 DIAGNOSIS — K449 Diaphragmatic hernia without obstruction or gangrene: Secondary | ICD-10-CM | POA: Diagnosis present

## 2018-01-26 LAB — COMPREHENSIVE METABOLIC PANEL
ALBUMIN: 2.4 g/dL — AB (ref 3.5–5.0)
ALK PHOS: 145 U/L — AB (ref 38–126)
ALT: 21 U/L (ref 0–44)
AST: 25 U/L (ref 15–41)
Anion gap: 14 (ref 5–15)
BILIRUBIN TOTAL: 1.5 mg/dL — AB (ref 0.3–1.2)
BUN: 35 mg/dL — ABNORMAL HIGH (ref 8–23)
CALCIUM: 8 mg/dL — AB (ref 8.9–10.3)
CO2: 22 mmol/L (ref 22–32)
CREATININE: 0.93 mg/dL (ref 0.44–1.00)
Chloride: 94 mmol/L — ABNORMAL LOW (ref 98–111)
GFR, EST AFRICAN AMERICAN: 59 mL/min — AB (ref >60)
GFR, EST NON AFRICAN AMERICAN: 51 mL/min — AB (ref >60)
Glucose, Bld: 129 mg/dL — ABNORMAL HIGH (ref 70–99)
Potassium: 5.4 mmol/L — ABNORMAL HIGH (ref 3.5–5.1)
SODIUM: 130 mmol/L — AB (ref 135–145)
Total Protein: 5.5 g/dL — ABNORMAL LOW (ref 6.5–8.1)

## 2018-01-26 LAB — URINALYSIS, ROUTINE W REFLEX MICROSCOPIC
Bilirubin Urine: NEGATIVE
Glucose, UA: NEGATIVE mg/dL
Hgb urine dipstick: NEGATIVE
Ketones, ur: NEGATIVE mg/dL
Leukocytes, UA: NEGATIVE
NITRITE: NEGATIVE
PROTEIN: NEGATIVE mg/dL
SPECIFIC GRAVITY, URINE: 1.017 (ref 1.005–1.030)
pH: 5 (ref 5.0–8.0)

## 2018-01-26 LAB — I-STAT CG4 LACTIC ACID, ED: LACTIC ACID, VENOUS: 1.7 mmol/L (ref 0.5–1.9)

## 2018-01-26 MED ORDER — SODIUM CHLORIDE 0.9 % IV BOLUS (SEPSIS)
250.0000 mL | Freq: Once | INTRAVENOUS | Status: AC
  Administered 2018-01-27: 250 mL via INTRAVENOUS

## 2018-01-26 MED ORDER — SODIUM CHLORIDE 0.9 % IV BOLUS (SEPSIS)
1000.0000 mL | Freq: Once | INTRAVENOUS | Status: AC
  Administered 2018-01-27: 1000 mL via INTRAVENOUS

## 2018-01-26 MED ORDER — SODIUM CHLORIDE 0.9 % IV BOLUS (SEPSIS)
500.0000 mL | Freq: Once | INTRAVENOUS | Status: AC
  Administered 2018-01-27: 500 mL via INTRAVENOUS

## 2018-01-26 MED ORDER — SODIUM CHLORIDE 0.9 % IV SOLN
2.0000 g | Freq: Once | INTRAVENOUS | Status: AC
  Administered 2018-01-27: 2 g via INTRAVENOUS
  Filled 2018-01-26: qty 20

## 2018-01-26 NOTE — ED Notes (Signed)
[facility] Expected date:  Expected time:  Means of arrival:  Comments: EMS altered mental status

## 2018-01-26 NOTE — ED Notes (Signed)
This nurse assisted RN Terri with ultrasound IV, unsuccessful attempt.

## 2018-01-26 NOTE — ED Provider Notes (Signed)
[facility] Emergency Department Provider Note MRN:  [id number]  Arrival date & time: 01/27/18     Chief Complaint   Altered Mental Status   History of Present Illness   Krystal Patrick is a 84 y.o. year-old female with a history of CAD, AAA, hypertension presenting to the ED with chief complaint of altered mental status.  Per family, patient is not acting her normal self.  Per facility, she is her baseline.  Recent elevated white blood cell count and labs performed at facility.  Urinalysis with no evidence of infection.  I was unable to obtain an accurate HPI, PMH, or ROS due to the patient's altered mental status.  Review of Systems  Positive for mental status  Patient's Health History    Past Medical History:  Diagnosis Date  . AAA (abdominal aortic aneurysm) (HCC)   . CAD (coronary artery disease)   . GERD (gastroesophageal reflux disease)   . Hypertension     Past Surgical History:  Procedure Laterality Date  . CHOLECYSTECTOMY    . SMALL BOWEL REPAIR      Family History  Problem Relation Age of Onset  . Heart disease Father   . Heart attack Father   . Diabetes Daughter   . Hypertension Daughter     Social History   Socioeconomic History  . Marital status: Widowed    Spouse name: Not on file  . Number of children: Not on file  . Years of education: Not on file  . Highest education level: Not on file  Occupational History  . Not on file  Social Needs  . Financial resource strain: Not on file  . Food insecurity:    Worry: Not on file    Inability: Not on file  . Transportation needs:    Medical: Not on file    Non-medical: Not on file  Tobacco Use  . Smoking status: Former Smoker    Types: Cigarettes    Last attempt to quit: 09/05/1989    Years since quitting: 28.4  . Smokeless tobacco: Never Used  Substance and Sexual Activity  . Alcohol use: No    Alcohol/week: 0.0 standard drinks  . Drug use: No  . Sexual activity: Not on  file  Lifestyle  . Physical activity:    Days per week: Not on file    Minutes per session: Not on file  . Stress: Not on file  Relationships  . Social connections:    Talks on phone: Not on file    Gets together: Not on file    Attends religious service: Not on file    Active member of club or organization: Not on file    Attends meetings of clubs or organizations: Not on file    Relationship status: Not on file  . Intimate partner violence:    Fear of current or ex partner: Not on file    Emotionally abused: Not on file    Physically abused: Not on file    Forced sexual activity: Not on file  Other Topics Concern  . Not on file  Social History Narrative  . Not on file     Physical Exam  Vital Signs and Nursing Notes reviewed Vitals:   01/26/18 2300 01/27/18 0010  BP: (!) 105/45 121/68  Pulse:  (!) 113  Resp: (!) 26 17  Temp:    SpO2: 97% 95%    CONSTITUTIONAL: Ill-appearing, NAD NEURO:  Alert and oriented x 3, no focal deficits EYES:  eyes equal and reactive ENT/NECK:  no LAD, no JVD CARDIO: Tachycardic rate, well-perfused, normal S1 and S2 PULM:  CTAB no wheezing or rhonchi GI/GU:  normal bowel sounds, non-distended, non-tender MSK/SPINE:  No gross deformities, no edema SKIN:  no rash, atraumatic PSYCH:  Appropriate speech and behavior  Diagnostic and Interventional Summary    EKG Interpretation  Date/Time:  Tuesday January 27 2018 00:06:40 EDT Ventricular Rate:  107 PR Interval:    QRS Duration: 99 QT Interval:  313 QTC Calculation: 418 R Axis:   -5 Text Interpretation:  Sinus tachycardia Multiple premature complexes, vent & supraven Abnormal R-wave progression, early transition Repol abnrm suggests ischemia, diffuse leads Minimal ST elevation, inferior leads Confirmed by Kennis CarinaBero, Sahmya Arai [phone]([id number]) on 01/27/2018 12:21:18 AM      Labs Reviewed  COMPREHENSIVE METABOLIC PANEL - Abnormal; Notable for the following components:      Result Value   Sodium 130  (*)    Potassium 5.4 (*)    Chloride 94 (*)    Glucose, Bld 129 (*)    BUN 35 (*)    Calcium 8.0 (*)    Total Protein 5.5 (*)    Albumin 2.4 (*)    Alkaline Phosphatase 145 (*)    Total Bilirubin 1.5 (*)    GFR calc non Af Amer 51 (*)    GFR calc Af Amer 59 (*)    All other components within normal limits  CBC WITH DIFFERENTIAL/PLATELET - Abnormal; Notable for the following components:   WBC 29.8 (*)    RBC 3.23 (*)    Hemoglobin 9.7 (*)    HCT 29.4 (*)    Neutro Abs 26.8 (*)    Monocytes Absolute 1.7 (*)    All other components within normal limits  CULTURE, BLOOD (ROUTINE X 2)  CULTURE, BLOOD (ROUTINE X 2)  URINALYSIS, ROUTINE W REFLEX MICROSCOPIC  I-STAT CG4 LACTIC ACID, ED  I-STAT TROPONIN, ED    DG Chest 2 View    (Results Pending)  CT ABDOMEN PELVIS W CONTRAST    (Results Pending)  CT Head Wo Contrast    (Results Pending)    Medications  vancomycin (VANCOCIN) IVPB 1000 mg/200 mL premix (has no administration in time range)  sodium chloride 0.9 % bolus 1,000 mL (1,000 mLs Intravenous New Bag/Given 01/27/18 0007)    And  sodium chloride 0.9 % bolus 500 mL (500 mLs Intravenous New Bag/Given 01/27/18 0009)    And  sodium chloride 0.9 % bolus 250 mL (250 mLs Intravenous New Bag/Given 01/27/18 0010)  cefTRIAXone (ROCEPHIN) 2 g in sodium chloride 0.9 % 100 mL IVPB (0 g Intravenous Stopped 01/27/18 0042)     Procedures  Emergency Ultrasound Study:   Angiocath insertion Performed by: Sabas SousMichael M Navayah Sok  Consent: Verbal consent obtained. Risks and benefits: risks, benefits and alternatives were discussed Immediately prior to procedure the correct patient, procedure, equipment, support staff and site/side marked as needed.  Indication: difficult IV access Preparation: Patient was prepped and draped in the usual sterile fashion. Vein Location: The right basilic vein was visualized during assessment for potential access sites and was found to be patent/ easily compressed with  linear ultrasound.  The needle was visualized with real-time ultrasound and guided into the vein. Gauge: 20  Normal blood return.  Patient tolerance: Patient tolerated the procedure well with no immediate complications.  Critical Care Critical Care Documentation Critical care time provided by me (excluding procedures): 44 minutes  Condition necessitating critical care: Sepsis  Components of  critical care management: reviewing of prior records, laboratory and imaging interpretation, frequent re-examination and reassessment of vital signs, administration of IV fluids, IV antibiotics.    ED Course and Medical Decision Making  I have reviewed the triage vital signs and the nursing notes.  Pertinent labs & imaging results that were available during my care of the patient were reviewed by me and considered in my medical decision making (see below for details).  Tachycardic, hypotensive 84 year old female with report of altered mental status.  Concern for sepsis, will provide fluid resuscitation and IV antibiotics.  Also considering complication of AAA, poor visibility with bedside ultrasound.  Awaiting CT imaging and chest x-ray.  Signed out to Dr. Manus Gunning at shift change.  Elmer Sow. Pilar Plate, MD [facility] Emergency Medicine [facility] [email or web address]  Final Clinical Impressions(s) / ED Diagnoses     ICD-10-CM   1. Hypotension I95.9 DG Chest 2 View    DG Chest 2 View    ED Discharge Orders    None         Sabas Sous, MD 01/27/18 [phone]

## 2018-01-26 NOTE — ED Notes (Signed)
Another unsuccessful ultrasound IV insertion, IV team consult made

## 2018-01-26 NOTE — ED Triage Notes (Signed)
Pt arrived via [facility] from [facility] due to a change in mental status, family visited today stated she is altered. Facility states this is pts baseline. Per ems facility states she has had a high white count, but urine screen negative.

## 2018-01-27 ENCOUNTER — Other Ambulatory Visit: Payer: Self-pay

## 2018-01-27 ENCOUNTER — Inpatient Hospital Stay (HOSPITAL_COMMUNITY): Payer: Medicare Other

## 2018-01-27 ENCOUNTER — Emergency Department (HOSPITAL_COMMUNITY): Payer: Medicare Other

## 2018-01-27 ENCOUNTER — Encounter (HOSPITAL_COMMUNITY): Payer: Self-pay

## 2018-01-27 DIAGNOSIS — E860 Dehydration: Secondary | ICD-10-CM | POA: Diagnosis present

## 2018-01-27 DIAGNOSIS — Z833 Family history of diabetes mellitus: Secondary | ICD-10-CM | POA: Diagnosis not present

## 2018-01-27 DIAGNOSIS — R6510 Systemic inflammatory response syndrome (SIRS) of non-infectious origin without acute organ dysfunction: Secondary | ICD-10-CM | POA: Diagnosis not present

## 2018-01-27 DIAGNOSIS — F329 Major depressive disorder, single episode, unspecified: Secondary | ICD-10-CM | POA: Diagnosis present

## 2018-01-27 DIAGNOSIS — I5033 Acute on chronic diastolic (congestive) heart failure: Secondary | ICD-10-CM | POA: Diagnosis not present

## 2018-01-27 DIAGNOSIS — K264 Chronic or unspecified duodenal ulcer with hemorrhage: Secondary | ICD-10-CM | POA: Diagnosis present

## 2018-01-27 DIAGNOSIS — Z79890 Hormone replacement therapy (postmenopausal): Secondary | ICD-10-CM | POA: Diagnosis not present

## 2018-01-27 DIAGNOSIS — D62 Acute posthemorrhagic anemia: Secondary | ICD-10-CM | POA: Diagnosis present

## 2018-01-27 DIAGNOSIS — I481 Persistent atrial fibrillation: Secondary | ICD-10-CM | POA: Diagnosis not present

## 2018-01-27 DIAGNOSIS — R933 Abnormal findings on diagnostic imaging of other parts of digestive tract: Secondary | ICD-10-CM | POA: Diagnosis not present

## 2018-01-27 DIAGNOSIS — R001 Bradycardia, unspecified: Secondary | ICD-10-CM | POA: Diagnosis not present

## 2018-01-27 DIAGNOSIS — G9341 Metabolic encephalopathy: Secondary | ICD-10-CM | POA: Diagnosis not present

## 2018-01-27 DIAGNOSIS — I714 Abdominal aortic aneurysm, without rupture: Secondary | ICD-10-CM

## 2018-01-27 DIAGNOSIS — I959 Hypotension, unspecified: Secondary | ICD-10-CM

## 2018-01-27 DIAGNOSIS — K210 Gastro-esophageal reflux disease with esophagitis: Secondary | ICD-10-CM | POA: Diagnosis not present

## 2018-01-27 DIAGNOSIS — X58XXXA Exposure to other specified factors, initial encounter: Secondary | ICD-10-CM | POA: Diagnosis present

## 2018-01-27 DIAGNOSIS — E871 Hypo-osmolality and hyponatremia: Secondary | ICD-10-CM | POA: Diagnosis present

## 2018-01-27 DIAGNOSIS — K6289 Other specified diseases of anus and rectum: Secondary | ICD-10-CM | POA: Diagnosis present

## 2018-01-27 DIAGNOSIS — I471 Supraventricular tachycardia: Secondary | ICD-10-CM | POA: Diagnosis not present

## 2018-01-27 DIAGNOSIS — K449 Diaphragmatic hernia without obstruction or gangrene: Secondary | ICD-10-CM | POA: Diagnosis present

## 2018-01-27 DIAGNOSIS — I110 Hypertensive heart disease with heart failure: Secondary | ICD-10-CM | POA: Diagnosis present

## 2018-01-27 DIAGNOSIS — Z9049 Acquired absence of other specified parts of digestive tract: Secondary | ICD-10-CM | POA: Diagnosis not present

## 2018-01-27 DIAGNOSIS — Z880 Allergy status to penicillin: Secondary | ICD-10-CM | POA: Diagnosis not present

## 2018-01-27 DIAGNOSIS — G92 Toxic encephalopathy: Secondary | ICD-10-CM | POA: Diagnosis present

## 2018-01-27 DIAGNOSIS — J9601 Acute respiratory failure with hypoxia: Secondary | ICD-10-CM | POA: Diagnosis not present

## 2018-01-27 DIAGNOSIS — K219 Gastro-esophageal reflux disease without esophagitis: Secondary | ICD-10-CM | POA: Diagnosis not present

## 2018-01-27 DIAGNOSIS — Z5309 Procedure and treatment not carried out because of other contraindication: Secondary | ICD-10-CM | POA: Diagnosis not present

## 2018-01-27 DIAGNOSIS — Z79899 Other long term (current) drug therapy: Secondary | ICD-10-CM | POA: Diagnosis not present

## 2018-01-27 DIAGNOSIS — I4891 Unspecified atrial fibrillation: Secondary | ICD-10-CM | POA: Diagnosis not present

## 2018-01-27 DIAGNOSIS — I272 Pulmonary hypertension, unspecified: Secondary | ICD-10-CM | POA: Diagnosis not present

## 2018-01-27 DIAGNOSIS — I2510 Atherosclerotic heart disease of native coronary artery without angina pectoris: Secondary | ICD-10-CM | POA: Diagnosis present

## 2018-01-27 DIAGNOSIS — I5043 Acute on chronic combined systolic (congestive) and diastolic (congestive) heart failure: Secondary | ICD-10-CM | POA: Diagnosis not present

## 2018-01-27 DIAGNOSIS — Z7982 Long term (current) use of aspirin: Secondary | ICD-10-CM | POA: Diagnosis not present

## 2018-01-27 DIAGNOSIS — I4819 Other persistent atrial fibrillation: Secondary | ICD-10-CM | POA: Diagnosis not present

## 2018-01-27 DIAGNOSIS — I480 Paroxysmal atrial fibrillation: Secondary | ICD-10-CM | POA: Diagnosis not present

## 2018-01-27 DIAGNOSIS — Z8249 Family history of ischemic heart disease and other diseases of the circulatory system: Secondary | ICD-10-CM | POA: Diagnosis not present

## 2018-01-27 LAB — I-STAT TROPONIN, ED: TROPONIN I, POC: 0.06 ng/mL (ref 0.00–0.08)

## 2018-01-27 LAB — CBC WITH DIFFERENTIAL/PLATELET
BASOS PCT: 0 %
Basophils Absolute: 0 10*3/uL (ref 0.0–0.1)
Basophils Absolute: 0 10*3/uL (ref 0.0–0.1)
Basophils Relative: 0 %
EOS ABS: 0 10*3/uL (ref 0.0–0.7)
EOS PCT: 0 %
Eosinophils Absolute: 0 10*3/uL (ref 0.0–0.7)
Eosinophils Relative: 0 %
HCT: 29.4 % — ABNORMAL LOW (ref 36.0–46.0)
HEMATOCRIT: 26.7 % — AB (ref 36.0–46.0)
HEMOGLOBIN: 9.7 g/dL — AB (ref 12.0–15.0)
HEMOGLOBIN: 8.9 g/dL — AB (ref 12.0–15.0)
LYMPHS PCT: 4 %
Lymphocytes Relative: 5 %
Lymphs Abs: 1.4 10*3/uL (ref 0.7–4.0)
Lymphs Abs: 1.1 10*3/uL (ref 0.7–4.0)
MCH: 30 pg (ref 26.0–34.0)
MCH: 30.5 pg (ref 26.0–34.0)
MCHC: 33 g/dL (ref 30.0–36.0)
MCHC: 33.3 g/dL (ref 30.0–36.0)
MCV: 91 fL (ref 78.0–100.0)
MCV: 91.4 fL (ref 78.0–100.0)
Monocytes Absolute: 1.7 10*3/uL — ABNORMAL HIGH (ref 0.1–1.0)
Monocytes Absolute: 1.6 10*3/uL — ABNORMAL HIGH (ref 0.1–1.0)
Monocytes Relative: 6 %
Monocytes Relative: 5 %
NEUTROS ABS: 29.3 10*3/uL — AB (ref 1.7–7.7)
NEUTROS PCT: 89 %
NEUTROS PCT: 91 %
Neutro Abs: 26.8 10*3/uL — ABNORMAL HIGH (ref 1.7–7.7)
PLATELETS: ADEQUATE 10*3/uL (ref 150–400)
Platelets: 531 10*3/uL — ABNORMAL HIGH (ref 150–400)
RBC: 3.23 MIL/uL — AB (ref 3.87–5.11)
RBC: 2.92 MIL/uL — AB (ref 3.87–5.11)
RDW: 14.1 % (ref 11.5–15.5)
RDW: 14.2 % (ref 11.5–15.5)
WBC: 29.8 10*3/uL — AB (ref 4.0–10.5)
WBC: 32 10*3/uL — AB (ref 4.0–10.5)

## 2018-01-27 LAB — BASIC METABOLIC PANEL
ANION GAP: 10 (ref 5–15)
BUN: 29 mg/dL — ABNORMAL HIGH (ref 8–23)
CO2: 21 mmol/L — AB (ref 22–32)
Calcium: 7.5 mg/dL — ABNORMAL LOW (ref 8.9–10.3)
Chloride: 105 mmol/L (ref 98–111)
Creatinine, Ser: 0.67 mg/dL (ref 0.44–1.00)
GFR calc non Af Amer: >60 mL/min (ref >60)
Glucose, Bld: 104 mg/dL — ABNORMAL HIGH (ref 70–99)
Potassium: 4.4 mmol/L (ref 3.5–5.1)
Sodium: 136 mmol/L (ref 135–145)

## 2018-01-27 LAB — MRSA PCR SCREENING: MRSA by PCR: NEGATIVE

## 2018-01-27 LAB — PROCALCITONIN: Procalcitonin: 0.17 ng/mL

## 2018-01-27 LAB — OCCULT BLOOD X 1 CARD TO LAB, STOOL: FECAL OCCULT BLD: POSITIVE — AB

## 2018-01-27 MED ORDER — SODIUM CHLORIDE 0.9% FLUSH
10.0000 mL | INTRAVENOUS | Status: DC | PRN
  Administered 2018-01-30: 30 mL
  Administered 2018-02-05: 10 mL
  Filled 2018-01-27 (×2): qty 40

## 2018-01-27 MED ORDER — DILTIAZEM HCL-DEXTROSE 100-5 MG/100ML-% IV SOLN (PREMIX)
5.0000 mg/h | INTRAVENOUS | Status: DC
  Filled 2018-01-27: qty 100

## 2018-01-27 MED ORDER — METOPROLOL TARTRATE 5 MG/5ML IV SOLN
5.0000 mg | Freq: Once | INTRAVENOUS | Status: AC
  Administered 2018-01-27: 5 mg via INTRAVENOUS
  Filled 2018-01-27: qty 5

## 2018-01-27 MED ORDER — GABAPENTIN 300 MG PO CAPS
300.0000 mg | ORAL_CAPSULE | Freq: Three times a day (TID) | ORAL | Status: DC
  Administered 2018-01-27 (×2): 300 mg via ORAL
  Filled 2018-01-27 (×3): qty 1

## 2018-01-27 MED ORDER — ALBUTEROL SULFATE (2.5 MG/3ML) 0.083% IN NEBU: 2.5000 mg | INHALATION_SOLUTION | Freq: Four times a day (QID) | RESPIRATORY_TRACT | Status: DC | PRN

## 2018-01-27 MED ORDER — VANCOMYCIN HCL IN DEXTROSE 1-5 GM/200ML-% IV SOLN
1000.0000 mg | Freq: Once | INTRAVENOUS | Status: AC
  Administered 2018-01-27: 1000 mg via INTRAVENOUS
  Filled 2018-01-27: qty 200

## 2018-01-27 MED ORDER — ENSURE ENLIVE PO LIQD
90.0000 mL | Freq: Two times a day (BID) | ORAL | Status: DC
  Administered 2018-01-27: 90 mL via ORAL

## 2018-01-27 MED ORDER — CAPSAICIN 0.025 % EX CREA
1.0000 "application " | TOPICAL_CREAM | Freq: Every day | CUTANEOUS | Status: DC
  Administered 2018-01-27 – 2018-02-07 (×9): 1 via TOPICAL
  Filled 2018-01-27 (×2): qty 60

## 2018-01-27 MED ORDER — SODIUM CHLORIDE 0.9 % IV SOLN
1.0000 g | Freq: Once | INTRAVENOUS | Status: AC
  Administered 2018-01-27: 1 g via INTRAVENOUS
  Filled 2018-01-27: qty 1

## 2018-01-27 MED ORDER — LORAZEPAM 2 MG/ML IJ SOLN
0.5000 mg | Freq: Once | INTRAMUSCULAR | Status: AC
  Administered 2018-01-27: 0.5 mg via INTRAVENOUS
  Filled 2018-01-27: qty 1

## 2018-01-27 MED ORDER — SODIUM CHLORIDE 0.9 % IV SOLN
INTRAVENOUS | Status: DC
  Administered 2018-01-27 (×3): via INTRAVENOUS

## 2018-01-27 MED ORDER — ACETAMINOPHEN 325 MG PO TABS
650.0000 mg | ORAL_TABLET | Freq: Four times a day (QID) | ORAL | Status: DC | PRN
  Filled 2018-01-27: qty 2

## 2018-01-27 MED ORDER — SODIUM CHLORIDE 0.9 % IV SOLN
1.0000 g | Freq: Two times a day (BID) | INTRAVENOUS | Status: DC
  Administered 2018-01-27: 1 g via INTRAVENOUS
  Filled 2018-01-27 (×2): qty 1

## 2018-01-27 MED ORDER — SODIUM CHLORIDE 0.9 % IV BOLUS
500.0000 mL | Freq: Once | INTRAVENOUS | Status: AC
  Administered 2018-01-27: 500 mL via INTRAVENOUS

## 2018-01-27 MED ORDER — SODIUM CHLORIDE 0.9% FLUSH
3.0000 mL | Freq: Two times a day (BID) | INTRAVENOUS | Status: DC
  Administered 2018-01-27 – 2018-02-06 (×14): 3 mL via INTRAVENOUS

## 2018-01-27 MED ORDER — ONDANSETRON HCL 4 MG/2ML IJ SOLN: 4.0000 mg | Freq: Four times a day (QID) | INTRAMUSCULAR | Status: DC | PRN

## 2018-01-27 MED ORDER — ONDANSETRON HCL 4 MG PO TABS: 4.0000 mg | ORAL_TABLET | Freq: Four times a day (QID) | ORAL | Status: DC | PRN

## 2018-01-27 MED ORDER — SODIUM CHLORIDE 0.9 % IV SOLN
1.0000 g | Freq: Two times a day (BID) | INTRAVENOUS | Status: DC
  Administered 2018-01-27 – 2018-01-28 (×2): 1 g via INTRAVENOUS
  Filled 2018-01-27 (×2): qty 1

## 2018-01-27 MED ORDER — VANCOMYCIN HCL IN DEXTROSE 750-5 MG/150ML-% IV SOLN
750.0000 mg | INTRAVENOUS | Status: DC
  Administered 2018-01-28: 750 mg via INTRAVENOUS
  Filled 2018-01-27: qty 150

## 2018-01-27 MED ORDER — SODIUM CHLORIDE 0.9% FLUSH
10.0000 mL | Freq: Two times a day (BID) | INTRAVENOUS | Status: DC
  Administered 2018-01-27 – 2018-01-30 (×5): 10 mL
  Administered 2018-01-31: 40 mL
  Administered 2018-02-03 – 2018-02-07 (×7): 10 mL

## 2018-01-27 MED ORDER — ACETAMINOPHEN 650 MG RE SUPP: 650.0000 mg | Freq: Four times a day (QID) | RECTAL | Status: DC | PRN

## 2018-01-27 MED ORDER — DIGOXIN 0.25 MG/ML IJ SOLN
0.2500 mg | Freq: Once | INTRAMUSCULAR | Status: AC
  Administered 2018-01-27: 0.25 mg via INTRAVENOUS
  Filled 2018-01-27: qty 1

## 2018-01-27 MED ORDER — GABAPENTIN 300 MG PO CAPS
300.0000 mg | ORAL_CAPSULE | Freq: Every day | ORAL | Status: DC
  Administered 2018-01-28 – 2018-02-07 (×10): 300 mg via ORAL
  Filled 2018-01-27 (×9): qty 1

## 2018-01-27 MED ORDER — ENSURE ENLIVE PO LIQD
237.0000 mL | Freq: Two times a day (BID) | ORAL | Status: DC
  Administered 2018-01-28 – 2018-02-06 (×6): 237 mL via ORAL

## 2018-01-27 MED ORDER — DILTIAZEM HCL 25 MG/5ML IV SOLN
10.0000 mg | Freq: Once | INTRAVENOUS | Status: AC
  Administered 2018-01-27: 10 mg via INTRAVENOUS
  Filled 2018-01-27: qty 5

## 2018-01-27 MED ORDER — PANTOPRAZOLE SODIUM 40 MG IV SOLR
40.0000 mg | Freq: Two times a day (BID) | INTRAVENOUS | Status: DC
  Administered 2018-01-27 – 2018-01-31 (×10): 40 mg via INTRAVENOUS
  Filled 2018-01-27 (×12): qty 40

## 2018-01-27 MED ORDER — PANTOPRAZOLE SODIUM 40 MG IV SOLR
40.0000 mg | Freq: Two times a day (BID) | INTRAVENOUS | Status: DC
  Filled 2018-01-27 (×2): qty 40

## 2018-01-27 MED ORDER — SODIUM CHLORIDE 0.9% IV SOLUTION: Freq: Once | INTRAVENOUS | Status: DC

## 2018-01-27 MED ORDER — IOHEXOL 300 MG/ML  SOLN
75.0000 mL | Freq: Once | INTRAMUSCULAR | Status: AC | PRN
  Administered 2018-01-27: 75 mL via INTRAVENOUS

## 2018-01-27 NOTE — Progress Notes (Addendum)
Patient seen and examined at bedside, patient admitted after midnight, please see earlier detailed admission note by Clydie Braunondell A Smith, MD. Briefly, patient presented with confusion concerning for infectious etiology. Started on empiric antibiotics. Has some back pain that is persistent from previous admission. X-ray unremarkable at that time. Will obtain repeat. No urine culture on this admission secondary to unremarkable urinalysis. Will obtain catheterized urine culture. Follow blood cultures. Daughter at bedside. Switch aztreonam to cefepime  Jacquelin Hawkingalph Patrecia Veiga, MD [facility] 01/27/2018, 4:03 PM

## 2018-01-27 NOTE — ED Notes (Signed)
ED TO INPATIENT HANDOFF REPORT  Name/Age/Gender Krystal Patrick 84 y.o. female  Code Status    Code Status Orders  (From admission, onward)         Start     Ordered   01/27/18 0432  Full code  Continuous     01/27/18 0434        Code Status History    Date Active Date Inactive Code Status Order ID Comments User Context   01/09/2018 0030 01/12/2018 1750 Full Code [id number]  Bethena Roys, MD Inpatient   05/30/2017 2302 06/03/2017 2014 Full Code [id number]  Ivor Costa, MD ED      Home/SNF/Other Nursing Home  Chief Complaint Behavioral Disturbance  Level of Care/Admitting Diagnosis ED Disposition    ED Disposition Condition [place]: [facility] [[id number]]  Level of Care: Telemetry [5]  Admit to tele based on following criteria: Complex arrhythmia (Bradycardia/Tachycardia)  Diagnosis: SIRS (systemic inflammatory response syndrome) [facility]) [[id number]]  Admitting Physician: Norval Morton [[id number]]  Attending Physician: Norval Morton [[id number]]  Estimated length of stay: past midnight tomorrow  Certification:: I certify this patient will need inpatient services for at least 2 midnights  PT Class (Do Not Modify): Inpatient [101]  PT Acc Code (Do Not Modify): Private [1]       Medical History Past Medical History:  Diagnosis Date  . AAA (abdominal aortic aneurysm) ([place])   . CAD (coronary artery disease)   . GERD (gastroesophageal reflux disease)   . Hypertension     Allergies Allergies  Allergen Reactions  . Penicillins Rash    Has patient had a PCN reaction causing immediate rash, facial/tongue/throat swelling, SOB or lightheadedness with hypotension: Unknown Has patient had a PCN reaction causing severe rash involving mucus membranes or skin necrosis: Unknown Has patient had a PCN reaction that required hospitalization: No Has patient had a PCN reaction occurring within the last 10 years: No If all of the above  answers are "NO", then may proceed with Cephalosporin use.     IV Location/Drains/Wounds Patient Lines/Drains/Airways Status   Active Line/Drains/Airways    Name:   Placement date:   Placement time:   Site:   Days:   Peripheral IV 01/26/18 Left Wrist   01/26/18    2355    Wrist   1          Labs/Imaging Results for orders placed or performed during the hospital encounter of 01/26/18 (from the past 48 hour(s))  Comprehensive metabolic panel     Status: Abnormal   Collection Time: 01/26/18 10:11 PM  Result Value Ref Range   Sodium 130 (L) 135 - 145 mmol/L   Potassium 5.4 (H) 3.5 - 5.1 mmol/L    Comment: SLIGHT HEMOLYSIS   Chloride 94 (L) 98 - 111 mmol/L   CO2 22 22 - 32 mmol/L   Glucose, Bld 129 (H) 70 - 99 mg/dL   BUN 35 (H) 8 - 23 mg/dL   Creatinine, Ser 0.93 0.44 - 1.00 mg/dL   Calcium 8.0 (L) 8.9 - 10.3 mg/dL   Total Protein 5.5 (L) 6.5 - 8.1 g/dL   Albumin 2.4 (L) 3.5 - 5.0 g/dL   AST 25 15 - 41 U/L   ALT 21 0 - 44 U/L   Alkaline Phosphatase 145 (H) 38 - 126 U/L   Total Bilirubin 1.5 (H) 0.3 - 1.2 mg/dL   GFR calc non Af Amer 51 (L) >60 mL/min   GFR calc  Af Amer 59 (L) >60 mL/min    Comment: (NOTE) The eGFR has been calculated using the CKD EPI equation. This calculation has not been validated in all clinical situations. eGFR's persistently <60 mL/min signify possible Chronic Kidney Disease.    Anion gap 14 5 - 15    Comment: Performed at [facility], [place] [address], [place], [place] [address]  CBC WITH DIFFERENTIAL     Status: Abnormal   Collection Time: 01/26/18 10:11 PM  Result Value Ref Range   WBC 29.8 (H) 4.0 - 10.5 K/uL   RBC 3.23 (L) 3.87 - 5.11 MIL/uL   Hemoglobin 9.7 (L) 12.0 - 15.0 g/dL   HCT 29.4 (L) 36.0 - 46.0 %   MCV 91.0 78.0 - 100.0 fL   MCH 30.0 26.0 - 34.0 pg   MCHC 33.0 30.0 - 36.0 g/dL   RDW 14.1 11.5 - 15.5 %   Platelets  150 - 400 K/uL    PLATELET CLUMPS NOTED ON SMEAR, COUNT APPEARS ADEQUATE   Neutrophils Relative  % 89 %   Neutro Abs 26.8 (H) 1.7 - 7.7 K/uL   Lymphocytes Relative 5 %   Lymphs Abs 1.4 0.7 - 4.0 K/uL   Monocytes Relative 6 %   Monocytes Absolute 1.7 (H) 0.1 - 1.0 K/uL   Eosinophils Relative 0 %   Eosinophils Absolute 0.0 0.0 - 0.7 K/uL   Basophils Relative 0 %   Basophils Absolute 0.0 0.0 - 0.1 K/uL    Comment: Performed at [facility], [place] [address], [place], [place] [address]  I-Stat CG4 Lactic Acid, ED  (not at  [facility])     Status: None   Collection Time: 01/26/18 10:33 PM  Result Value Ref Range   Lactic Acid, Venous 1.70 0.5 - 1.9 mmol/L  Urinalysis, Routine w reflex microscopic     Status: None   Collection Time: 01/26/18 10:51 PM  Result Value Ref Range   Color, Urine YELLOW YELLOW   APPearance CLEAR CLEAR   Specific Gravity, Urine 1.017 1.005 - 1.030   pH 5.0 5.0 - 8.0   Glucose, UA NEGATIVE NEGATIVE mg/dL   Hgb urine dipstick NEGATIVE NEGATIVE   Bilirubin Urine NEGATIVE NEGATIVE   Ketones, ur NEGATIVE NEGATIVE mg/dL   Protein, ur NEGATIVE NEGATIVE mg/dL   Nitrite NEGATIVE NEGATIVE   Leukocytes, UA NEGATIVE NEGATIVE    Comment: Performed at [facility] [address] [place], [place] [address]  Blood Culture (routine x 2)     Status: None (Preliminary result)   Collection Time: 01/27/18 12:44 AM  Result Value Ref Range   Specimen Description      BLOOD RIGHT UPPER ARM Performed at [facility], [place] [address] [place], [place] [address]    Special Requests      BOTTLES DRAWN AEROBIC AND ANAEROBIC Blood Culture adequate volume Performed at [facility] [address] [place], [place] [address]    Culture PENDING    Report Status PENDING   I-stat troponin, ED     Status: None   Collection Time: 01/27/18 12:57 AM  Result Value Ref Range   Troponin i, poc 0.06 0.00 - 0.08 ng/mL   Comment 3            Comment: Due to the release kinetics of cTnI, a negative result within the first hours of  the onset of symptoms does not rule out myocardial infarction with certainty. If myocardial infarction is still  suspected, repeat the test at appropriate intervals.    Ct Head Wo Contrast  Result Date: 01/27/2018 CLINICAL DATA:  Altered mental status.  No reported injury. EXAM: CT HEAD WITHOUT CONTRAST TECHNIQUE: Contiguous axial images were obtained from the base of the skull through the vertex without intravenous contrast. COMPARISON:  05/30/2017 head CT. FINDINGS: Motion degraded scan, limiting assessment. Brain: Bilateral basal ganglia lacunes. No evidence of parenchymal hemorrhage or extra-axial fluid collection. No mass lesion, mass effect, or midline shift. No CT evidence of acute infarction. Generalized cerebral volume loss. Nonspecific moderate subcortical and periventricular white matter hypodensity, most in keeping with chronic small vessel ischemic change. Cerebral ventricle sizes are stable and concordant with the degree of cerebral volume loss. Vascular: No acute abnormality. Skull: No evidence of calvarial fracture. Sinuses/Orbits: Partial opacification of the sphenoid sinus and posterior right ethmoidal air cells, similar to prior. Other:  The mastoid air cells are unopacified. IMPRESSION: 1. Limited motion degraded scan. 2.  No evidence of acute intracranial abnormality. 3. Generalized cerebral volume loss with moderate chronic small vessel ischemic changes in the cerebral white matter. 4. Chronic paranasal sinusitis. Electronically Signed   By: Ilona Sorrel M.D.   On: 01/27/2018 02:48   Ct Abdomen Pelvis W Contrast  Result Date: 01/27/2018 CLINICAL DATA:  84 year old female with sepsis. EXAM: CT ABDOMEN AND PELVIS WITH CONTRAST TECHNIQUE: Multidetector CT imaging of the abdomen and pelvis was performed using the standard protocol following bolus administration of intravenous contrast. CONTRAST:  65m OMNIPAQUE IOHEXOL 300 MG/ML  SOLN COMPARISON:  Chest CT dated 01/08/2018 FINDINGS:  Evaluation of this exam is limited in the absence of intravenous contrast. Lower chest: Partially visualized small right pleural effusion and subsegmental right lung base atelectatic changes. Calcified right lung base granuloma. Multi vessel coronary vascular calcification and calcification of the mitral annulus. No intra-abdominal free air or free fluid. Hepatobiliary: The liver is unremarkable. No intrahepatic biliary ductal dilatation. Cholecystectomy. Pancreas: Unremarkable. No pancreatic ductal dilatation or surrounding inflammatory changes. Spleen: Normal in size without focal abnormality. Adrenals/Urinary Tract: The adrenal glands are unremarkable. There is no hydronephrosis or nephrolithiasis on either side. There is a 5 cm right renal inferior pole cyst. There is symmetric enhancement and excretion of contrast by both kidneys. A 2.5 cm hypodense lesion arising from the superior pole of the left kidney is not well characterized but may represent a complex or septated cyst. This can be further evaluated with ultrasound or MRI on a nonemergent basis. Duplicated bilateral renal collecting systems. The urinary bladder is unremarkable. Stomach/Bowel: There is diffuse circumferential thickening of the esophagus most consistent with esophagitis. There is apparent thickening of the gastroesophageal junction which may be inflammatory in nature although a neoplasm is not excluded. Further evaluation with endoscopy after resolution of active inflammatory changes of the esophagus is recommended. Small pocket of gas along the medial wall of the second portion of the duodenum is suboptimally evaluated but may represent gas within the lumen although duodenal diverticulum or ulcer is not excluded. Evaluation of this area is very limited on this CT. There is extensive sigmoid diverticulosis without active inflammatory changes. Large amount of stool noted in the distal colon. There is no bowel obstruction. Vascular/Lymphatic:  Advanced aortoiliac atherosclerotic disease. There is a 3.5 cm infrarenal aortic aneurysm. Reproductive: Hysterectomy.  No pelvic mass. Other: None Musculoskeletal: Osteopenia with degenerative changes of the spine. Grade 1 L4-L5 anterolisthesis. Probable old healed sacral fracture. No acute osseous pathology. IMPRESSION: 1. Circumferential thickening and inflammation of the  esophagus most consistent with esophagitis. Soft tissue thickening of the gastroesophageal junction may be inflammatory or neoplastic. Further evaluation with endoscopy following resolution of the acute esophageal inflammation recommended. 2. Possible diverticula of the medial wall of the second portion of the duodenum. 3. Extensive sigmoid diverticulosis.  No bowel obstruction. 4. Advanced choose 1 5. A 3.5 cm infrarenal aortic aneurysm. Recommend followup by ultrasound in 2 years. This recommendation follows ACR consensus guidelines: White Paper of the ACR Incidental Findings Committee II on Vascular Findings. J Am Coll Radiol 2013; 10:789-794. Electronically Signed   By: Anner Crete M.D.   On: 01/27/2018 03:39   Dg Chest Port 1 View  Result Date: 01/27/2018 CLINICAL DATA:  84 year old female with hypotension and altered mental status. EXAM: PORTABLE CHEST 1 VIEW COMPARISON:  Chest radiograph dated 01/09/2018 FINDINGS: The lungs are clear. There is no pleural effusion or pneumothorax. There is mild cardiomegaly. There is calcification of the mitral annulus as well as atherosclerotic calcification of the thoracic aorta. No acute osseous pathology. IMPRESSION: No active disease. Electronically Signed   By: Anner Crete M.D.   On: 01/27/2018 02:16    Pending Labs Unresulted Labs (From admission, onward)    Start     Ordered   01/27/18 0436  Procalcitonin  Add-on,   R     01/27/18 0436   01/27/18 0433  CBC WITH DIFFERENTIAL  Once,   R     01/27/18 0434   01/27/18 9528  Basic metabolic panel  Once,   R     01/27/18 0434    01/27/18 0433  Type and screen [facility]  STAT,   R    Comments:  [place]    01/27/18 0434   01/27/18 0421  Occult blood card to lab, stool  STAT,   R     01/27/18 0420   01/26/18 2148  Blood Culture (routine x 2)  BLOOD CULTURE X 2,   STAT     01/26/18 2210          Vitals/Pain Today's Vitals   01/27/18 0300 01/27/18 0330 01/27/18 0355 01/27/18 0400  BP: 97/74 110/86 110/86 110/87  Pulse: (!) 107  100 (!) 104  Resp: 20 (!) 23 (!) 22 20  Temp:      SpO2: 98%  95% 95%  Weight:      Height:      PainSc:        Isolation Precautions No active isolations  Medications Medications  NUTRITIONAL DRINK LIQD (has no administration in time range)  capsaicin (ZOSTRIX) 4.132 % cream 1 application (has no administration in time range)  0.9 %  sodium chloride infusion (Manually program via Guardrails IV Fluids) (has no administration in time range)  sodium chloride flush (NS) 0.9 % injection 3 mL (has no administration in time range)  acetaminophen (TYLENOL) tablet 650 mg (has no administration in time range)    Or  acetaminophen (TYLENOL) suppository 650 mg (has no administration in time range)  ondansetron (ZOFRAN) tablet 4 mg (has no administration in time range)    Or  ondansetron (ZOFRAN) injection 4 mg (has no administration in time range)  albuterol (PROVENTIL) (2.5 MG/3ML) 0.083% nebulizer solution 2.5 mg (has no administration in time range)  pantoprazole (PROTONIX) injection 40 mg (has no administration in time range)  sodium chloride 0.9 % bolus 1,000 mL (0 mLs Intravenous Stopped 01/27/18 0319)    And  sodium chloride 0.9 % bolus 500 mL (  0 mLs Intravenous Stopped 01/27/18 0339)    And  sodium chloride 0.9 % bolus 250 mL (0 mLs Intravenous Stopped 01/27/18 0442)  cefTRIAXone (ROCEPHIN) 2 g in sodium chloride 0.9 % 100 mL IVPB (0 g Intravenous Stopped 01/27/18 0042)  vancomycin (VANCOCIN) IVPB 1000 mg/200 mL premix (0 mg Intravenous  Stopped 01/27/18 0258)  aztreonam (AZACTAM) 1 g in sodium chloride 0.9 % 100 mL IVPB (0 g Intravenous Stopped 01/27/18 0442)  iohexol (OMNIPAQUE) 300 MG/ML solution 75 mL (75 mLs Intravenous Contrast Given 01/27/18 0227)    Mobility non-ambulatory

## 2018-01-27 NOTE — Progress Notes (Signed)
Initial Nutrition Assessment  DOCUMENTATION CODES:   (Will assess for malnutrition at follow-up.)  INTERVENTION:  - Diet advancement as medically feasible. - Ensure Enlive order already in for BID, each supplement provides 350 kcal and 20 grams of protein. - Will obtain PTA information and perform NFPE at follow-up.   NUTRITION DIAGNOSIS:   Inadequate oral intake related to inability to eat as evidenced by NPO status.  GOAL:   Patient will meet greater than or equal to 90% of their needs  MONITOR:   Diet advancement, Weight trends, Labs  REASON FOR ASSESSMENT:   Malnutrition Screening Tool  ASSESSMENT:   84 y.o. female with medical history significant of HTN, CAD, AAA, and depression. She presented to the ED with AMS. History was obtained from the patient's grandson as patient was unable to provide her own history. Patient recently hospitalized from 9/5-9/9, after having a fall with associated low back and sciatica pain. Patient had been sent to [facility] after discharge. She had elevated WBC count on recent lab work. Grandson noted that around noon on 9/22 she was mildly confused which is not abnormal for her but by 5 PM she was completely altered. Patient was not making sense with what she was saying, talking to people that are not there, and slurring her speech. While at [facility] patient reportedly had a poor appetite, poor oral intake, and new cough over the past week. Family suspected that the patient was dehydrated and/or possibly had a urinary tract infection.  BMI indicates normal weight. Patient has been NPO since admission. Patient noted to be a/o to self only. She is currently out of the room in Diagnostic Radiology. This RD saw patient on 9/6 at which time patient reported good appetite with no recent appetite changes, no difficulties with chewing or swallowing. Per H&P, as outlined above, family reported patient's appetite and PO intakes were poor for at least the past week.    Per chart review, current weight is 126 lb and patient weighed 132 lb on 2/25. This indicates 5 lb weight loss (4.5% body weight) in the past 7 months.    Medications reviewed. Labs reviewed from last draw last night; Na: 130 mmol/L, K: 5.4 mmol/L, Cl: 94 mmol/L, BUN: 35 mg/dL, Ca: 8 mg/dL, Alk Phos elevated, GFR: 51 mL/min.  IVF; NS @ 50 mL/hr.      NUTRITION - FOCUSED PHYSICAL EXAM:  Patient out of room (in diagnostic radiology)  Diet Order:   Diet Order            Diet NPO time specified  Diet effective now              EDUCATION NEEDS:   Not appropriate for education at this time  Skin:  Skin Assessment: Reviewed RN Assessment  Last BM:  PTA/unknown  Height:   Ht Readings from Last 1 Encounters:  01/27/18 5' (1.524 m)    Weight:   Wt Readings from Last 1 Encounters:  01/26/18 57 kg    Ideal Body Weight:  45.45 kg  BMI:  Body mass index is 24.54 kg/m.  Estimated Nutritional Needs:   Kcal:  7673-4193  Protein:  57-68 grams  Fluid:  >/= 1.6 L/day     Jarome Matin, MS, RD, LDN, [facility] Inpatient Clinical Dietitian Pager # [phone] After hours/weekend pager # [phone]

## 2018-01-27 NOTE — Progress Notes (Signed)
Upon assessment pt vitals Temp 98.6 , BP 103/55, RR 26, HR afib 115-130s. NP on call notified. Orders placed and carried out. Will continue to monitor pt carefully.

## 2018-01-27 NOTE — Progress Notes (Signed)
A consult was received from an ED physician for Vancomycin, aztreonam per pharmacy dosing.  The patient's profile has been reviewed for ht/wt/allergies/indication/available labs.   A one time order has been placed for Vancomycin 1gm iv x1, and Aztreonam 1gm iv x1.  Further antibiotics/pharmacy consults should be ordered by admitting physician if indicated.                       Thank you, Aleene DavidsonGrimsley Jr, Mandeep Kiser Crowford 01/27/2018  1:28 AM

## 2018-01-27 NOTE — ED Notes (Signed)
Attempted to call report, nurse unavailable at this time.

## 2018-01-27 NOTE — Progress Notes (Signed)
Pharmacy Antibiotic Note  Krystal SavoyBetty Patrick is a 84 y.o. female admitted on 01/26/2018 with sepsis.  Pharmacy has been consulted for Vancomycin, aztreonam dosing.  Plan: Aztreonam 1gm iv q12hr  Vancomycin 1gm iv x1, then 750mg  iv q36hr  Goal AUC = 400 - 500 for all indications, except meningitis (goal AUC > 500 and Cmin 15-20 mcg/mL)   Height: 5\' 2"  (157.5 cm) Weight: 125 lb 10.6 oz (57 kg) IBW/kg (Calculated) : 50.1  Temp (24hrs), Avg:97.9 F (36.6 C), Min:97.7 F (36.5 C), Max:98.1 F (36.7 C)  Recent Labs  Lab 01/26/18 2211 01/26/18 2233  WBC 29.8*  --   CREATININE 0.93  --   LATICACIDVEN  --  1.70    Estimated Creatinine Clearance: 29.3 mL/min (by C-G formula based on SCr of 0.93 mg/dL).    Allergies  Allergen Reactions  . Penicillins Rash    Has patient had a PCN reaction causing immediate rash, facial/tongue/throat swelling, SOB or lightheadedness with hypotension: Unknown Has patient had a PCN reaction causing severe rash involving mucus membranes or skin necrosis: Unknown Has patient had a PCN reaction that required hospitalization: No Has patient had a PCN reaction occurring within the last 10 years: No If all of the above answers are "NO", then may proceed with Cephalosporin use.     Antimicrobials this admission: Vancomycin 01/27/2018 >> Aztreonam 01/27/2018 >>   Dose adjustments this admission: -  Microbiology results: -  Thank you for allowing pharmacy to be a part of this patient's care.  Krystal DavidsonGrimsley Patrick, Krystal Patrick 01/27/2018 5:47 AM

## 2018-01-27 NOTE — ED Provider Notes (Signed)
Care assumed from Dr. Pilar PlateBero.  Nursing home patient with altered mental status and not acting like herself.  Had elevated white blood cell count.  Patient treated for sepsis with unclear source.  Chest x-ray does not show any pneumonia.  CT head is stable.  Abdominal CT scan shows esophagitis and thickening of the GE junction.  Patient has received IV fluids and IV antibiotics for possible sepsis. Patient's blood pressure has stabilized.  She remains confused per family at bedside. She is recently been on steroids which may explain her leukocytosis.  However she also does have hypotension and tachycardia which is since normalized.  Admission discussed with Dr. Katrinka BlazingSmith.  CRITICAL CARE Performed by: Glynn OctaveANCOUR, General Wearing Total critical care time: 31 minutes Critical care time was exclusive of separately billable procedures and treating other patients. Critical care was necessary to treat or prevent imminent or life-threatening deterioration. Critical care was time spent personally by me on the following activities: development of treatment plan with patient and/or surrogate as well as nursing, discussions with consultants, evaluation of patient's response to treatment, examination of patient, obtaining history from patient or surrogate, ordering and performing treatments and interventions, ordering and review of laboratory studies, ordering and review of radiographic studies, pulse oximetry and re-evaluation of patient's condition.    Glynn Octaveancour, Braxtin Bamba, MD 01/27/18 [phone]

## 2018-01-27 NOTE — H&P (Addendum)
History and Physical    Krystal SavoyBetty Grisso ZOX:[id number] DOB: [birth year] DOA: 01/26/2018  Referring MD/NP/PA: Glynn OctaveStephen Rancour, MD PCP: Kaleen MaskElkins, Wilson Oliver, MD  Patient coming from: Nursing facility via EMS  Chief Complaint: Altered  I have personally briefly reviewed patient's old medical records in  Link   HPI: Krystal Patrick is a 84 y.o. female with medical history significant of HTN, CAD, AAA, and depression; who presents with altered mental status.  History is obtained from the patient's grandson who is present at bedside as patient is unable to provide her own history at this time as she is altered.  Patient just recently been hospitalized from 9/5-9/9, after having a fall with complaints of low back pain and sciatica.  Patient had been sent to [facility] where she was nearing discharge.  However, patient had been noted to have a elevated white blood cell count on recent lab work.  At baseline patient is very sharp and with it.  Her grandson noted that around noon yesterday she was just mildly confused which is not abnormal for her from time to time, but by 5 PM that afternoon she was completely altered.  Patient reportedly was not making sense with what she was saying, talking to people that are not there, and slurring her speech.  While at rehabilitation patient reportedly having a poor appetite with poor oral intake and new cough over the last 1 week.  Family suspected that the patient was dehydrated and/or possibly had a urinary tract infection.   ED Course: On admission into the emergency department patient was noted to be afebrile, pulse 98-1 13, respirations 17-26, blood pressure 87/47-121/68, and O2 saturations maintained maintained.  Labs revealed WBC 29.8, hemoglobin 9.7, sodium 130, potassium 5.4, BUN 35, and creatinine 0.93.  Urinalysis did not show any acute signs of infection.  Chest x-ray and CT of the brain showed no acute abnormalities.  CT of the  abdomen and pelvis revealed a 3.5 infrarenal AAA aneurysm and inflammation at the esophagus concerning for esophagitis.  Sepsis protocol initiated with patient given total of 1.75 L of normal saline IV fluids and Rocephin for presumed urinary source.  However, after urinalysis came back negative for signs of infection aztreonam and vancomycin were added on.  TRH called to admit.  Review of Systems  Unable to perform ROS: Mental status change  Respiratory: Positive for cough.   Neurological: Positive for speech change.    Past Medical History:  Diagnosis Date  . AAA (abdominal aortic aneurysm) (HCC)   . CAD (coronary artery disease)   . GERD (gastroesophageal reflux disease)   . Hypertension     Past Surgical History:  Procedure Laterality Date  . CHOLECYSTECTOMY    . SMALL BOWEL REPAIR       reports that she quit smoking about 28 years ago. Her smoking use included cigarettes. She has never used smokeless tobacco. She reports that she does not drink alcohol or use drugs.  Allergies  Allergen Reactions  . Penicillins Rash    Has patient had a PCN reaction causing immediate rash, facial/tongue/throat swelling, SOB or lightheadedness with hypotension: Unknown Has patient had a PCN reaction causing severe rash involving mucus membranes or skin necrosis: Unknown Has patient had a PCN reaction that required hospitalization: No Has patient had a PCN reaction occurring within the last 10 years: No If all of the above answers are "NO", then may proceed with Cephalosporin use.     Family History  Problem Relation Age  of Onset  . Heart disease Father   . Heart attack Father   . Diabetes Daughter   . Hypertension Daughter     Prior to Admission medications   Medication Sig Start Date End Date Taking? Authorizing Provider  acetaminophen (TYLENOL) 650 MG CR tablet Take 650 mg by mouth every 8 (eight) hours as needed for pain.   Yes [provider]  amLODipine (NORVASC) 10 MG  tablet Take 10 mg by mouth daily.    Yes [provider]  aspirin EC 81 MG tablet Take 81 mg by mouth daily.   Yes [provider]  capsaicin (ARTHRITIS PAIN RELIEF) 0.025 % cream Apply 1 application topically daily.   Yes [provider]  Cholecalciferol (VITAMIN D3) 10000 units TABS Take 1,000 Units by mouth daily.   Yes [provider]  furosemide (LASIX) 20 MG tablet Take 20 mg by mouth daily. Monday, wednesday, and friday   Yes [provider]  gabapentin (NEURONTIN) 300 MG capsule Take 1 capsule (300 mg total) by mouth 3 (three) times daily. 01/11/18  Yes Randel Pigg, Dorma Russell, MD  lisinopril (PRINIVIL,ZESTRIL) 40 MG tablet Take 40 mg by mouth daily.   Yes [provider]  Melatonin 5 MG TABS Take 5 mg by mouth at bedtime as needed (sleep).   Yes [provider]  methocarbamol (ROBAXIN) 500 MG tablet Take 500 mg by mouth every 8 (eight) hours.   Yes [provider]  methocarbamol (ROBAXIN) 750 MG tablet Take 1 tablet (750 mg total) by mouth every 8 (eight) hours as needed for muscle spasms. 01/11/18  Yes Randel Pigg, Dorma Russell, MD  metoprolol tartrate (LOPRESSOR) 25 MG tablet Take 25 mg by mouth 2 (two) times daily.   Yes [provider]  Nutritional Supplements (NUTRITIONAL DRINK PO) Take 90 mLs by mouth 2 (two) times daily.   Yes [provider]  polyethylene glycol (MIRALAX / GLYCOLAX) packet Take 17 g by mouth daily as needed for mild constipation. 01/11/18  Yes Randel Pigg, Dorma Russell, MD  traMADol (ULTRAM) 50 MG tablet Take 0.5 tablets (25 mg total) by mouth every 12 (twelve) hours as needed for moderate pain. Patient taking differently: Take 25 mg by mouth every 12 (twelve) hours as needed for moderate pain. And 50 mg every 6 hours for sciatic pain until 01/27/18 01/11/18  Yes Randel Pigg, Dorma Russell, MD  vitamin B-12 (CYANOCOBALAMIN) 500 MCG tablet Take 500 mcg by mouth daily.   Yes [provider]     Physical Exam:  Constitutional: Elderly female who appears to be unable to get comfortable Vitals:   01/27/18 0101 01/27/18 0300 01/27/18 0330 01/27/18 0355  BP: (!) 91/57 97/74 110/86 110/86  Pulse: 100 (!) 107  100  Resp: (!) 21 20 (!) 23 (!) 22  Temp:      SpO2:  98%  95%  Weight:      Height:       Eyes: PERRL, lids and conjunctivae normal ENMT: Mucous membranes are dry. Posterior pharynx clear of any exudate or lesions. Poor dentition.  Neck: normal, supple, no masses, no thyromegaly Respiratory: clear to auscultation bilaterally, no wheezing, no crackles. Normal respiratory effort. No accessory muscle use.  Cardiovascular: Tachycardic, no murmurs / rubs / gallops. No extremity edema. 2+ pedal pulses. No carotid bruits.  Abdomen: no tenderness, no masses palpated. No hepatosplenomegaly. Bowel sounds positive.  Musculoskeletal: no clubbing / cyanosis. No joint deformity upper and lower extremities. Good ROM, no contractures. Normal muscle tone.  Skin: Bruising present of the bilateral wrists and poor skin turgor. Neurologic: CN 2-12 grossly intact. Sensation intact, DTR normal. Strength 5/5 in all 4.  Psychiatric: Alert oriented only to person.    Labs on Admission: I have personally reviewed following labs and imaging studies  CBC: Recent Labs  Lab 01/26/18 2211  WBC 29.8*  NEUTROABS 26.8*  HGB 9.7*  HCT 29.4*  MCV 91.0  PLT PLATELET CLUMPS NOTED ON SMEAR, COUNT APPEARS ADEQUATE   Basic Metabolic Panel: Recent Labs  Lab 01/26/18 2211  NA 130*  K 5.4*  CL 94*  CO2 22  GLUCOSE 129*  BUN 35*  CREATININE 0.93  CALCIUM 8.0*   GFR: Estimated Creatinine Clearance: 29.3 mL/min (by C-G formula based on SCr of 0.93 mg/dL). Liver Function Tests: Recent Labs  Lab 01/26/18 2211  AST 25  ALT 21  ALKPHOS 145*  BILITOT 1.5*  PROT 5.5*  ALBUMIN 2.4*   No results for input(s): LIPASE, AMYLASE in the last 168 hours. No results for input(s): AMMONIA in the  last 168 hours. Coagulation Profile: No results for input(s): INR, PROTIME in the last 168 hours. Cardiac Enzymes: No results for input(s): CKTOTAL, CKMB, CKMBINDEX, TROPONINI in the last 168 hours. BNP (last 3 results) No results for input(s): PROBNP in the last 8760 hours. HbA1C: No results for input(s): HGBA1C in the last 72 hours. CBG: No results for input(s): GLUCAP in the last 168 hours. Lipid Profile: No results for input(s): CHOL, HDL, LDLCALC, TRIG, CHOLHDL, LDLDIRECT in the last 72 hours. Thyroid Function Tests: No results for input(s): TSH, T4TOTAL, FREET4, T3FREE, THYROIDAB in the last 72 hours. Anemia Panel: No results for input(s): VITAMINB12, FOLATE, FERRITIN, TIBC, IRON, RETICCTPCT in the last 72 hours. Urine analysis:    Component Value Date/Time   COLORURINE YELLOW 01/26/2018 2251   APPEARANCEUR CLEAR 01/26/2018 2251   LABSPEC 1.017 01/26/2018 2251   PHURINE 5.0 01/26/2018 2251   GLUCOSEU NEGATIVE 01/26/2018 2251   HGBUR NEGATIVE 01/26/2018 2251   BILIRUBINUR NEGATIVE 01/26/2018 2251   KETONESUR NEGATIVE 01/26/2018 2251   PROTEINUR NEGATIVE 01/26/2018 2251   NITRITE NEGATIVE 01/26/2018 2251   LEUKOCYTESUR NEGATIVE 01/26/2018 2251   Sepsis Labs: Recent Results (from the past 240 hour(s))  Blood Culture (routine x 2)     Status: None (Preliminary result)   Collection Time: 01/27/18 12:44 AM  Result Value Ref Range Status   Specimen Description   Final    BLOOD RIGHT UPPER ARM Performed at [facility], [address], [place], [place] [address]    Special Requests   Final    BOTTLES DRAWN AEROBIC AND ANAEROBIC Blood Culture adequate volume Performed at [facility], [address] [place], [place] [address]    Culture PENDING  Incomplete   Report Status PENDING  Incomplete     Radiological Exams on Admission: Ct Head Wo Contrast  Result Date: 01/27/2018 CLINICAL DATA:  Altered mental status.  No reported injury. EXAM: CT  HEAD WITHOUT CONTRAST TECHNIQUE: Contiguous axial images were obtained from the base of the skull through the vertex without intravenous contrast. COMPARISON:  05/30/2017 head CT. FINDINGS: Motion degraded scan, limiting assessment. Brain: Bilateral basal ganglia lacunes. No evidence of parenchymal hemorrhage or extra-axial fluid collection. No mass lesion, mass effect, or midline shift. No CT evidence of acute infarction. Generalized cerebral volume loss. Nonspecific moderate subcortical and periventricular white matter hypodensity, most in keeping with chronic small vessel ischemic change. Cerebral ventricle sizes are stable and  concordant with the degree of cerebral volume loss. Vascular: No acute abnormality. Skull: No evidence of calvarial fracture. Sinuses/Orbits: Partial opacification of the sphenoid sinus and posterior right ethmoidal air cells, similar to prior. Other:  The mastoid air cells are unopacified. IMPRESSION: 1. Limited motion degraded scan. 2.  No evidence of acute intracranial abnormality. 3. Generalized cerebral volume loss with moderate chronic small vessel ischemic changes in the cerebral white matter. 4. Chronic paranasal sinusitis. Electronically Signed   By: Delbert Phenix M.D.   On: 01/27/2018 02:48   Ct Abdomen Pelvis W Contrast  Result Date: 01/27/2018 CLINICAL DATA:  84 year old female with sepsis. EXAM: CT ABDOMEN AND PELVIS WITH CONTRAST TECHNIQUE: Multidetector CT imaging of the abdomen and pelvis was performed using the standard protocol following bolus administration of intravenous contrast. CONTRAST:  75mL OMNIPAQUE IOHEXOL 300 MG/ML  SOLN COMPARISON:  Chest CT dated 01/08/2018 FINDINGS: Evaluation of this exam is limited in the absence of intravenous contrast. Lower chest: Partially visualized small right pleural effusion and subsegmental right lung base atelectatic changes. Calcified right lung base granuloma. Multi vessel coronary vascular calcification and calcification of  the mitral annulus. No intra-abdominal free air or free fluid. Hepatobiliary: The liver is unremarkable. No intrahepatic biliary ductal dilatation. Cholecystectomy. Pancreas: Unremarkable. No pancreatic ductal dilatation or surrounding inflammatory changes. Spleen: Normal in size without focal abnormality. Adrenals/Urinary Tract: The adrenal glands are unremarkable. There is no hydronephrosis or nephrolithiasis on either side. There is a 5 cm right renal inferior pole cyst. There is symmetric enhancement and excretion of contrast by both kidneys. A 2.5 cm hypodense lesion arising from the superior pole of the left kidney is not well characterized but may represent a complex or septated cyst. This can be further evaluated with ultrasound or MRI on a nonemergent basis. Duplicated bilateral renal collecting systems. The urinary bladder is unremarkable. Stomach/Bowel: There is diffuse circumferential thickening of the esophagus most consistent with esophagitis. There is apparent thickening of the gastroesophageal junction which may be inflammatory in nature although a neoplasm is not excluded. Further evaluation with endoscopy after resolution of active inflammatory changes of the esophagus is recommended. Small pocket of gas along the medial wall of the second portion of the duodenum is suboptimally evaluated but may represent gas within the lumen although duodenal diverticulum or ulcer is not excluded. Evaluation of this area is very limited on this CT. There is extensive sigmoid diverticulosis without active inflammatory changes. Large amount of stool noted in the distal colon. There is no bowel obstruction. Vascular/Lymphatic: Advanced aortoiliac atherosclerotic disease. There is a 3.5 cm infrarenal aortic aneurysm. Reproductive: Hysterectomy.  No pelvic mass. Other: None Musculoskeletal: Osteopenia with degenerative changes of the spine. Grade 1 L4-L5 anterolisthesis. Probable old healed sacral fracture. No acute  osseous pathology. IMPRESSION: 1. Circumferential thickening and inflammation of the esophagus most consistent with esophagitis. Soft tissue thickening of the gastroesophageal junction may be inflammatory or neoplastic. Further evaluation with endoscopy following resolution of the acute esophageal inflammation recommended. 2. Possible diverticula of the medial wall of the second portion of the duodenum. 3. Extensive sigmoid diverticulosis.  No bowel obstruction. 4. Advanced choose 1 5. A 3.5 cm infrarenal aortic aneurysm. Recommend followup by ultrasound in 2 years. This recommendation follows ACR consensus guidelines: White Paper of the ACR Incidental Findings Committee II on Vascular Findings. J Am Coll Radiol 2013; 10:789-794. Electronically Signed   By: Elgie Collard M.D.   On: 01/27/2018 03:39   Dg Chest Port 1 View  Result Date:  01/27/2018 CLINICAL DATA:  84 year old female with hypotension and altered mental status. EXAM: PORTABLE CHEST 1 VIEW COMPARISON:  Chest radiograph dated 01/09/2018 FINDINGS: The lungs are clear. There is no pleural effusion or pneumothorax. There is mild cardiomegaly. There is calcification of the mitral annulus as well as atherosclerotic calcification of the thoracic aorta. No acute osseous pathology. IMPRESSION: No active disease. Electronically Signed   By: Elgie Collard M.D.   On: 01/27/2018 02:16    EKG: Independently reviewed.  Sinus tachycardia 107 bpm  Assessment/Plan SIRS: Acute.  Patient presents with hypotension, tachycardia, tachypnea, and elevated white blood cell count of 29.8.  Lactic acid was reassuring at 1.7.  No clear source of infection at this time as chest x-ray and urinalysis both appear clear. - Admit to the telemetry bed - Follow-up blood cultures - Check procalcitonin - Continue empiric antibiotics of vancomycin and aztreonam - May want to recheck chest x-ray - Recheck CBC in a.m  Acute encephalopathy: Patient appears acutely altered.   Question dehydration. - Neurochecks - Consider need of MRI of the brain if symptoms persist  Acute blood loss anemia: Initial hemoglobin 9.7 previously noted to be within normal limits during last admission earlier this month.  Patient with elevated BUN to possibly suggest upper GI bleed. - Check stool guaiac - Type and screen for possible need of blood products - Check CBC - Transfuse blood products as needed  Hypotension: Acute.  Patient's initial blood pressures noted be as low as 87/47 on admission.  Blood pressures improved after initial IV fluids. - Hold amlodipine, furosemide, lisinopril, and metoprolol - Restart blood pressure medications when medically appropriate  Dehydration: Acute.  Patient with poor skin turgor and dry mucous membranes suggest patient acutely dehydrated along with elevated BUN to creatinine ratio.. - Continue gentle IV fluid hydration  GERD with esophagitis: Acute. - Protonix IV  Hyperkalemia: Acute.  Initial potassium 5.4 on admission.  Patient was given IV fluids. - Recheck potassium in a.m.   CAD - Restart aspirin once ruled out possible bleed  Lower back pain  - Continue gabapentin  Hyponatremia: Acute initial sodium 130 on admission. - Recheck sodium in a.m.   AAA: Patient with infrarenal AAA noted to be 3.5 cm. - Continue outpatient monitoring  DVT prophylaxis: SCDs Code Status: Full Family Communication: Discussed plan of care with the patient family present at bedside Disposition Plan: Likely discharge back to skilled nursing facility once medically stable Consults called: None Admission status: Inpatient  Clydie Braun MD [facility] Pager [phone]   If 7PM-7AM, please contact night-coverage [email or web address] Password [email or web address]  01/27/2018, 4:11 AM

## 2018-01-27 NOTE — ED Notes (Signed)
IV antibiotics delayed due to difficult IV stick. Dr. Pilar PlateBero started ultrasound IV.

## 2018-01-27 NOTE — Progress Notes (Signed)
Pharmacy Antibiotic Note  Krystal Patrick is a 84 y.o. female admitted on 01/26/2018 with sepsis.  Pharmacy initially consulted for Vancomycin and Aztreonam dosing. Noted penicillin allergy; however, patient tolerated dose of Ceftriaxone in the ED (and has received Cephalexin on previous admission), so Aztreonam being switched to Cefepime.   Plan: -Cefepime 1g IV q12h -Continue present dosing of Vancomycin 750mg  IV q36h for now  -Vancomycin peak/trough levels at steady state, as indicated -Monitor renal function, cultures, clinical course  Height: 5' (152.4 cm) Weight: 125 lb 10.6 oz (57 kg) IBW/kg (Calculated) : 45.5  Temp (24hrs), Avg:97.9 F (36.6 C), Min:97.7 F (36.5 C), Max:98.1 F (36.7 C)  Recent Labs  Lab 01/26/18 2211 01/26/18 2233 01/27/18 1359  WBC 29.8*  --  32.0*  CREATININE 0.93  --  0.67  LATICACIDVEN  --  1.70  --     Estimated Creatinine Clearance: 34 mL/min (by C-G formula based on SCr of 0.67 mg/dL).    Allergies  Allergen Reactions  . Penicillins Rash    Has patient had a PCN reaction causing immediate rash, facial/tongue/throat swelling, SOB or lightheadedness with hypotension: Unknown Has patient had a PCN reaction causing severe rash involving mucus membranes or skin necrosis: Unknown Has patient had a PCN reaction that required hospitalization: No Has patient had a PCN reaction occurring within the last 10 years: No If all of the above answers are "NO", then may proceed with Cephalosporin use.     Antimicrobials this admission: 9/24 Ceftriaxone x 1 9/24 Aztreonam >> 9/24 9/24 Vancomycin >> 9/24 Cefepime >>  Dose adjustments this admission: -  Microbiology results: 9/24 0044 BCx: pending 9/24 1414 BCx: sent 9/24 UCx (catheterized): sent 9/24 MRSA PCR: negative   Thank you for allowing pharmacy to be a part of this patient's care.   Greer PickerelJigna Carmeron Heady, PharmD, BCPS Pager: [phone] 01/27/2018 4:43 PM

## 2018-01-28 ENCOUNTER — Inpatient Hospital Stay (HOSPITAL_COMMUNITY): Payer: Medicare Other

## 2018-01-28 ENCOUNTER — Ambulatory Visit (HOSPITAL_COMMUNITY): Admission: RE | Admit: 2018-01-28 | Payer: Medicare Other | Source: Ambulatory Visit

## 2018-01-28 DIAGNOSIS — R6510 Systemic inflammatory response syndrome (SIRS) of non-infectious origin without acute organ dysfunction: Secondary | ICD-10-CM

## 2018-01-28 DIAGNOSIS — R0602 Shortness of breath: Secondary | ICD-10-CM

## 2018-01-28 DIAGNOSIS — I272 Pulmonary hypertension, unspecified: Secondary | ICD-10-CM

## 2018-01-28 DIAGNOSIS — I4891 Unspecified atrial fibrillation: Secondary | ICD-10-CM

## 2018-01-28 LAB — URINE CULTURE: CULTURE: NO GROWTH

## 2018-01-28 LAB — BASIC METABOLIC PANEL
ANION GAP: 5 (ref 5–15)
BUN: 31 mg/dL — AB (ref 8–23)
CALCIUM: 7.2 mg/dL — AB (ref 8.9–10.3)
CO2: 22 mmol/L (ref 22–32)
CREATININE: 0.64 mg/dL (ref 0.44–1.00)
Chloride: 111 mmol/L (ref 98–111)
GFR calc Af Amer: >60 mL/min (ref >60)
GLUCOSE: 109 mg/dL — AB (ref 70–99)
Potassium: 4.5 mmol/L (ref 3.5–5.1)
Sodium: 138 mmol/L (ref 135–145)

## 2018-01-28 LAB — CBC
HCT: 22.1 % — ABNORMAL LOW (ref 36.0–46.0)
Hemoglobin: 7.1 g/dL — ABNORMAL LOW (ref 12.0–15.0)
MCH: 29.8 pg (ref 26.0–34.0)
MCHC: 32.1 g/dL (ref 30.0–36.0)
MCV: 92.9 fL (ref 78.0–100.0)
PLATELETS: 452 10*3/uL — AB (ref 150–400)
RBC: 2.38 MIL/uL — ABNORMAL LOW (ref 3.87–5.11)
RDW: 14.5 % (ref 11.5–15.5)
WBC: 23.2 10*3/uL — AB (ref 4.0–10.5)

## 2018-01-28 LAB — PREPARE RBC (CROSSMATCH)

## 2018-01-28 LAB — ABO/RH: ABO/RH(D): O POS

## 2018-01-28 MED ORDER — SODIUM CHLORIDE 0.9% IV SOLUTION
Freq: Once | INTRAVENOUS | Status: AC
  Administered 2018-01-28: 14:00:00 via INTRAVENOUS

## 2018-01-28 MED ORDER — METOPROLOL TARTRATE 5 MG/5ML IV SOLN: 5.0000 mg | Freq: Four times a day (QID) | INTRAVENOUS | Status: DC | PRN

## 2018-01-28 MED ORDER — TRAMADOL HCL 50 MG PO TABS
25.0000 mg | ORAL_TABLET | Freq: Two times a day (BID) | ORAL | Status: DC | PRN
  Administered 2018-02-05 – 2018-02-07 (×3): 25 mg via ORAL
  Filled 2018-01-28 (×4): qty 1

## 2018-01-28 MED ORDER — DILTIAZEM HCL 25 MG/5ML IV SOLN
10.0000 mg | Freq: Once | INTRAVENOUS | Status: AC
  Administered 2018-01-28: 10 mg via INTRAVENOUS
  Filled 2018-01-28: qty 5

## 2018-01-28 MED ORDER — MELATONIN 5 MG PO TABS
5.0000 mg | ORAL_TABLET | Freq: Every evening | ORAL | Status: DC | PRN
  Administered 2018-01-29 – 2018-02-06 (×3): 5 mg via ORAL
  Filled 2018-01-28 (×6): qty 1

## 2018-01-28 MED ORDER — SODIUM CHLORIDE 0.9% IV SOLUTION: Freq: Once | INTRAVENOUS | Status: AC

## 2018-01-28 MED ORDER — DILTIAZEM HCL-DEXTROSE 100-5 MG/100ML-% IV SOLN (PREMIX)
5.0000 mg/h | INTRAVENOUS | Status: DC
  Filled 2018-01-28: qty 100

## 2018-01-28 MED ORDER — FUROSEMIDE 10 MG/ML IJ SOLN
20.0000 mg | Freq: Once | INTRAMUSCULAR | Status: AC
  Administered 2018-01-28: 20 mg via INTRAVENOUS
  Filled 2018-01-28: qty 2

## 2018-01-28 MED ORDER — DILTIAZEM LOAD VIA INFUSION: 10.0000 mg | Freq: Once | INTRAVENOUS | Status: DC

## 2018-01-28 MED ORDER — MORPHINE SULFATE (PF) 2 MG/ML IV SOLN
2.0000 mg | Freq: Once | INTRAVENOUS | Status: AC
  Administered 2018-01-28: 2 mg via INTRAVENOUS
  Filled 2018-01-28: qty 1

## 2018-01-28 NOTE — Consult Note (Signed)
Consultation  Referring Provider: Dr. Blake DivineAkula     Primary Care Physician:  Kaleen MaskElkins, Wilson Oliver, MD Primary Gastroenterologist: Gentry FitzUnassigned        Reason for Consultation:  Anemia, Abnormal CT Abdomen            HPI:   Krystal Patrick is a 84 y.o. female with a past medical history as listed below including hypertension, CAD, AAA and depression, who presented to the ER on 01/26/2018 with altered mental status from her nursing facility.    Much of the history is garnered from admission H&P.  Apparently patient's history was obtained from her grandson.  Was noted patient had recently been hospitalized from 9/5-9/9 after having a fall with complaints of low back pain and sciatica and had been in rehabilitation.  She was noted to have an elevated white blood cell count on lab work.  Grandson notes she is very sharp at baseline.  Apparently around noon on 01/25/2018 the patient was just mildly confused which was normal for her from time to time but by 5 PM the afternoon she is completely altered.  While in rehab patient does report a poor appetite with poor oral intake and a new cough over the past week.  We were consulted in regards to a new anemia.    Today, patient is found in the ICU on BiPAP unable to answer any of my questions.  History is garnered from her daughter and grandson who are in the room.  Apparently patient was doing well outpatient until she had a fall and since then has struggled to "regain herself".  Per them they are somewhat frustrated that we are unsure exactly what we are treating.  They describe patient was having no pain prior to this admission.  Krystal Patrick does report that patient finally had a bowel movement yesterday that was black and tarry in appearance, apparently they thought she may be obstructed at admission.  This is the first black stool she has had per them.  She does not complain of heartburn, reflux or abdominal pain.    Denies fever, weight loss, nausea or  vomiting.   ED Course: On admission into the emergency department patient was noted to be afebrile, pulse 98-1 13, respirations 17-26, blood pressure 87/47-121/68, and O2 saturations maintained.  Labs revealed WBC 29.8, hemoglobin 9.7, sodium 130, potassium 5.4, BUN 35, and creatinine 0.93.  Urinalysis did not show any acute signs of infection.  Chest x-ray and CT of the brain showed no acute abnormalities.  CT of the abdomen and pelvis revealed a 3.5 infrarenal AAA aneurysm and inflammation at the esophagus concerning for esophagitis.  Sepsis protocol initiated with patient given total of 1.75 L of normal saline IV fluids and Rocephin for presumed urinary source.  However, after urinalysis came back negative for signs of infection aztreonam and vancomycin were added on.  [facility] called to admit.  GI History: none  Past Medical History:  Diagnosis Date  . AAA (abdominal aortic aneurysm) (HCC)   . CAD (coronary artery disease)   . GERD (gastroesophageal reflux disease)   . Hypertension     Past Surgical History:  Procedure Laterality Date  . CHOLECYSTECTOMY    . SMALL BOWEL REPAIR      Family History  Problem Relation Age of Onset  . Heart disease Father   . Heart attack Father   . Diabetes Daughter   . Hypertension Daughter     Social History   Tobacco Use  .  Smoking status: Former Smoker    Types: Cigarettes    Last attempt to quit: 09/05/1989    Years since quitting: 28.4  . Smokeless tobacco: Never Used  Substance Use Topics  . Alcohol use: No    Alcohol/week: 0.0 standard drinks  . Drug use: No    Prior to Admission medications   Medication Sig Start Date End Date Taking? Authorizing Provider  acetaminophen (TYLENOL) 650 MG CR tablet Take 650 mg by mouth every 8 (eight) hours as needed for pain.   Yes [provider]  amLODipine (NORVASC) 10 MG tablet Take 10 mg by mouth daily.    Yes [provider]  aspirin EC 81 MG tablet Take 81 mg by mouth daily.    Yes [provider]  capsaicin (ARTHRITIS PAIN RELIEF) 0.025 % cream Apply 1 application topically daily.   Yes [provider]  Cholecalciferol (VITAMIN D3) 10000 units TABS Take 1,000 Units by mouth daily.   Yes [provider]  furosemide (LASIX) 20 MG tablet Take 20 mg by mouth daily. Monday, wednesday, and friday   Yes [provider]  gabapentin (NEURONTIN) 300 MG capsule Take 1 capsule (300 mg total) by mouth 3 (three) times daily. 01/11/18  Yes Randel Pigg, Dorma Russell, MD  lisinopril (PRINIVIL,ZESTRIL) 40 MG tablet Take 40 mg by mouth daily.   Yes [provider]  Melatonin 5 MG TABS Take 5 mg by mouth at bedtime as needed (sleep).   Yes [provider]  methocarbamol (ROBAXIN) 500 MG tablet Take 500 mg by mouth every 8 (eight) hours.   Yes [provider]  methocarbamol (ROBAXIN) 750 MG tablet Take 1 tablet (750 mg total) by mouth every 8 (eight) hours as needed for muscle spasms. 01/11/18  Yes Randel Pigg, Dorma Russell, MD  metoprolol tartrate (LOPRESSOR) 25 MG tablet Take 25 mg by mouth 2 (two) times daily.   Yes [provider]  Nutritional Supplements (NUTRITIONAL DRINK PO) Take 90 mLs by mouth 2 (two) times daily.   Yes [provider]  polyethylene glycol (MIRALAX / GLYCOLAX) packet Take 17 g by mouth daily as needed for mild constipation. 01/11/18  Yes Randel Pigg, Dorma Russell, MD  traMADol (ULTRAM) 50 MG tablet Take 0.5 tablets (25 mg total) by mouth every 12 (twelve) hours as needed for moderate pain. Patient taking differently: Take 25 mg by mouth every 12 (twelve) hours as needed for moderate pain. And 50 mg every 6 hours for sciatic pain until 01/27/18 01/11/18  Yes Randel Pigg, Dorma Russell, MD  vitamin B-12 (CYANOCOBALAMIN) 500 MCG tablet Take 500 mcg by mouth daily.   Yes [provider]    Current Facility-Administered Medications  Medication Dose Route Frequency Provider Last Rate Last Dose  . 0.9 %  sodium  chloride infusion (Manually program via Guardrails IV Fluids)   Intravenous Once Kathlen Mody, MD      . 0.9 %  sodium chloride infusion (Manually program via Guardrails IV Fluids)   Intravenous Once Kathlen Mody, MD      . 0.9 %  sodium chloride infusion (Manually program via Guardrails IV Fluids)   Intravenous Once Kathlen Mody, MD      . acetaminophen (TYLENOL) tablet 650 mg  650 mg Oral Q6H PRN Kathlen Mody, MD       Or  . acetaminophen (TYLENOL) suppository 650 mg  650 mg Rectal Q6H PRN Kathlen Mody, MD      . albuterol (PROVENTIL) (2.5 MG/3ML) 0.083% nebulizer solution 2.5 mg  2.5 mg Nebulization Q6H PRN Kathlen Mody, MD      . capsaicin (ZOSTRIX) 0.025 % cream 1 application  1 application Topical Daily Kathlen Mody, MD   1 application at 01/28/18 0921  . diltiazem (CARDIZEM) 100 mg in dextrose 5% (1 mg/mL) infusion  5-15 mg/hr Intravenous Titrated Kathlen Mody, MD      . feeding supplement (ENSURE ENLIVE) (ENSURE ENLIVE) liquid 237 mL  237 mL Oral BID BM Kathlen Mody, MD   237 mL at 01/28/18 0921  . furosemide (LASIX) injection 20 mg  20 mg Intravenous Once Kathlen Mody, MD      . gabapentin (NEURONTIN) capsule 300 mg  300 mg Oral Daily Kathlen Mody, MD   300 mg at 01/28/18 0921  . Melatonin TABS 5 mg  5 mg Oral QHS PRN Kathlen Mody, MD      . metoprolol tartrate (LOPRESSOR) injection 5 mg  5 mg Intravenous Q6H PRN Kathlen Mody, MD      . ondansetron (ZOFRAN) tablet 4 mg  4 mg Oral Q6H PRN Kathlen Mody, MD       Or  . ondansetron (ZOFRAN) injection 4 mg  4 mg Intravenous Q6H PRN Kathlen Mody, MD      . pantoprazole (PROTONIX) injection 40 mg  40 mg Intravenous Q12H Kathlen Mody, MD   40 mg at 01/28/18 0922  . sodium chloride flush (NS) 0.9 % injection 10-40 mL  10-40 mL Intracatheter Q12H Kathlen Mody, MD   10 mL at 01/28/18 0922  . sodium chloride flush (NS) 0.9 % injection 10-40 mL  10-40 mL Intracatheter PRN Kathlen Mody, MD      . sodium chloride flush (NS) 0.9  % injection 3 mL  3 mL Intravenous Q12H Kathlen Mody, MD   3 mL at 01/28/18 0922  . traMADol (ULTRAM) tablet 25 mg  25 mg Oral Q12H PRN Kathlen Mody, MD        Allergies as of 01/26/2018 - Review Complete 01/26/2018  Allergen Reaction Noted  . Penicillins Rash 09/06/2014     Review of Systems:    Limited per family hx , see HPI   Physical Exam:  Vital signs in last 24 hours: Temp:  [97.5 F (36.4 C)-98.6 F (37 C)] 98 F (36.7 C) (09/25 1310) Pulse Rate:  [77-131] 121 (09/25 1246) Resp:  [18-32] 25 (09/25 1249) BP: (83-160)/(33-102) 108/70 (09/25 1246) SpO2:  [92 %-100 %] 100 % (09/25 1249) FiO2 (%):  [70 %] 70 % (09/25 1249) Last BM Date: 01/27/18 General:   In respiratory distress requiring bipap Head:  Normocephalic and atraumatic. Eyes:   PEERL, EOMI. No icterus. Conjunctiva pink. Ears:  Normal auditory acuity. Neck:  Supple Throat: Oral cavity and pharynx without inflammation (limited evaluation due to bipap) Lungs: basilar rales, diminished at bases-on bipap Heart: Normal S1, S2. Irregular rhythm No peripheral edema, cyanosis or pallor.  Abdomen:  Soft, nondistended, nontender. No rebound or guarding. Normal bowel sounds. No appreciable masses or hepatomegaly. Rectal:  Not performed.  Msk:  Symmetrical without gross deformities. Peripheral pulses intact.  Extremities:  Without edema, no deformity or joint abnormality.  Neurologic:  Alert and  oriented x4;  grossly normal neurologically.  Skin:   Dry and intact without significant lesions or rashes. Psychiatric: Appears irritated/agitated   LAB RESULTS: Recent Labs    01/26/18 2211 01/27/18 1359 01/28/18 0311  WBC 29.8* 32.0* 23.2*  HGB 9.7* 8.9* 7.1*  HCT 29.4* 26.7* 22.1*  PLT PLATELET CLUMPS NOTED ON SMEAR,  COUNT APPEARS ADEQUATE 531* 452*   BMET Recent Labs    01/26/18 2211 01/27/18 1359 01/28/18 0311  NA 130* 136 138  K 5.4* 4.4 4.5  CL 94* 105 111  CO2 22 21* 22  GLUCOSE 129* 104* 109*  BUN  35* 29* 31*  CREATININE 0.93 0.67 0.64  CALCIUM 8.0* 7.5* 7.2*   LFT Recent Labs    01/26/18 2211  PROT 5.5*  ALBUMIN 2.4*  AST 25  ALT 21  ALKPHOS 145*  BILITOT 1.5*   STUDIES: Dg Lumbar Spine 2-3 Views  Result Date: 01/27/2018 CLINICAL DATA:  Pain across lower back, no injury EXAM: LUMBAR SPINE - 2-3 VIEW COMPARISON:  Earlier CT abdomen and pelvis of 01/27/2018 FINDINGS: Osseous demineralization. Five non-rib-bearing lumbar vertebra. Hypoplastic last ribs. Vertebral body heights maintained without fracture or bone destruction. Minimal anterolisthesis L4-L5 likely due to facet degenerative changes, which are present throughout the lower lumbar spine. SI joints preserved. Excreted contrast material within urinary bladder. Atherosclerotic calcifications aorta with fusiform aneurysmal dilatation of the mid to distal abdominal aorta measuring 4.0 cm greatest AP diameter on lateral view, though this contains inherent radiographic magnification; the preceding CT abdomen and pelvis demonstrated maximal AP diameter of 3.4 cm at the same level. 7 mm nonobstructing inferior pole RIGHT renal calculus. IMPRESSION: Osseous demineralization with mild scattered degenerative facet disease changes of the lumbar spine. No acute lumbar spine abnormalities. Abdominal aortic aneurysm, please refer to preceding CT for accurate assessment Nonobstructing 7 mm RIGHT renal calculus. Electronically Signed   By: Ulyses Southward M.D.   On: 01/27/2018 14:56   Ct Head Wo Contrast  Result Date: 01/27/2018 CLINICAL DATA:  Altered mental status.  No reported injury. EXAM: CT HEAD WITHOUT CONTRAST TECHNIQUE: Contiguous axial images were obtained from the base of the skull through the vertex without intravenous contrast. COMPARISON:  05/30/2017 head CT. FINDINGS: Motion degraded scan, limiting assessment. Brain: Bilateral basal ganglia lacunes. No evidence of parenchymal hemorrhage or extra-axial fluid collection. No mass lesion,  mass effect, or midline shift. No CT evidence of acute infarction. Generalized cerebral volume loss. Nonspecific moderate subcortical and periventricular white matter hypodensity, most in keeping with chronic small vessel ischemic change. Cerebral ventricle sizes are stable and concordant with the degree of cerebral volume loss. Vascular: No acute abnormality. Skull: No evidence of calvarial fracture. Sinuses/Orbits: Partial opacification of the sphenoid sinus and posterior right ethmoidal air cells, similar to prior. Other:  The mastoid air cells are unopacified. IMPRESSION: 1. Limited motion degraded scan. 2.  No evidence of acute intracranial abnormality. 3. Generalized cerebral volume loss with moderate chronic small vessel ischemic changes in the cerebral white matter. 4. Chronic paranasal sinusitis. Electronically Signed   By: Delbert Phenix M.D.   On: 01/27/2018 02:48   Ct Abdomen Pelvis W Contrast  Result Date: 01/27/2018 CLINICAL DATA:  84 year old female with sepsis. EXAM: CT ABDOMEN AND PELVIS WITH CONTRAST TECHNIQUE: Multidetector CT imaging of the abdomen and pelvis was performed using the standard protocol following bolus administration of intravenous contrast. CONTRAST:  75mL OMNIPAQUE IOHEXOL 300 MG/ML  SOLN COMPARISON:  Chest CT dated 01/08/2018 FINDINGS: Evaluation of this exam is limited in the absence of intravenous contrast. Lower chest: Partially visualized small right pleural effusion and subsegmental right lung base atelectatic changes. Calcified right lung base granuloma. Multi vessel coronary vascular calcification and calcification of the mitral annulus. No intra-abdominal free air or free fluid. Hepatobiliary: The liver is unremarkable. No intrahepatic biliary ductal dilatation. Cholecystectomy. Pancreas: Unremarkable. No  pancreatic ductal dilatation or surrounding inflammatory changes. Spleen: Normal in size without focal abnormality. Adrenals/Urinary Tract: The adrenal glands are  unremarkable. There is no hydronephrosis or nephrolithiasis on either side. There is a 5 cm right renal inferior pole cyst. There is symmetric enhancement and excretion of contrast by both kidneys. A 2.5 cm hypodense lesion arising from the superior pole of the left kidney is not well characterized but may represent a complex or septated cyst. This can be further evaluated with ultrasound or MRI on a nonemergent basis. Duplicated bilateral renal collecting systems. The urinary bladder is unremarkable. Stomach/Bowel: There is diffuse circumferential thickening of the esophagus most consistent with esophagitis. There is apparent thickening of the gastroesophageal junction which may be inflammatory in nature although a neoplasm is not excluded. Further evaluation with endoscopy after resolution of active inflammatory changes of the esophagus is recommended. Small pocket of gas along the medial wall of the second portion of the duodenum is suboptimally evaluated but may represent gas within the lumen although duodenal diverticulum or ulcer is not excluded. Evaluation of this area is very limited on this CT. There is extensive sigmoid diverticulosis without active inflammatory changes. Large amount of stool noted in the distal colon. There is no bowel obstruction. Vascular/Lymphatic: Advanced aortoiliac atherosclerotic disease. There is a 3.5 cm infrarenal aortic aneurysm. Reproductive: Hysterectomy.  No pelvic mass. Other: None Musculoskeletal: Osteopenia with degenerative changes of the spine. Grade 1 L4-L5 anterolisthesis. Probable old healed sacral fracture. No acute osseous pathology. IMPRESSION: 1. Circumferential thickening and inflammation of the esophagus most consistent with esophagitis. Soft tissue thickening of the gastroesophageal junction may be inflammatory or neoplastic. Further evaluation with endoscopy following resolution of the acute esophageal inflammation recommended. 2. Possible diverticula of the  medial wall of the second portion of the duodenum. 3. Extensive sigmoid diverticulosis.  No bowel obstruction. 4. Advanced choose 1 5. A 3.5 cm infrarenal aortic aneurysm. Recommend followup by ultrasound in 2 years. This recommendation follows ACR consensus guidelines: White Paper of the ACR Incidental Findings Committee II on Vascular Findings. J Am Coll Radiol 2013; 10:789-794. Electronically Signed   By: Elgie Collard M.D.   On: 01/27/2018 03:39   Dg Chest Port 1 View  Result Date: 01/28/2018 CLINICAL DATA:  Acute respiratory distress EXAM: PORTABLE CHEST 1 VIEW COMPARISON:  01/27/2018 FINDINGS: The heart remains moderately enlarged. Vascular congestion. Mild interstitial edema. Small pleural effusions are suspected. IMPRESSION: CHF with mild pulmonary edema and suspected small pleural effusions. Electronically Signed   By: Jolaine Click M.D.   On: 01/28/2018 12:57   Dg Chest Port 1 View  Result Date: 01/27/2018 CLINICAL DATA:  84 year old female with hypotension and altered mental status. EXAM: PORTABLE CHEST 1 VIEW COMPARISON:  Chest radiograph dated 01/09/2018 FINDINGS: The lungs are clear. There is no pleural effusion or pneumothorax. There is mild cardiomegaly. There is calcification of the mitral annulus as well as atherosclerotic calcification of the thoracic aorta. No acute osseous pathology. IMPRESSION: No active disease. Electronically Signed   By: Elgie Collard M.D.   On: 01/27/2018 02:16     Impression / Plan:   Impression: 1.  SIRS: Presented with hypotension, tachycardia, tachypnea and elevated white blood cell count of 29.8, lactic acid was reassuring at 1.7 chest x-ray and urinalysis were clear, started on empiric antibiotics with vancomycin and aztreonam, WBC trending down 2.  Acute encephalopathy: It was thought this may be due to dehydration, though still appears altered 3.  Acute blood loss anemia: Hemoglobin 9.7-->7.1 (  previously noted to be within normal limits during  last admission earlier this month), elevated BUN at 35 4.  GERD with esophagitis: Seen on CT of the abdomen pelvis, currently on Protonix IV 5.  Hyperkalemia: Initial potassium 5.4, given fluids, now normal 6.  CAD: Maintained on aspirin held now due to possible GI bleed 7. ? Melena?: per grandsons report yesterday, concern for upper GI bleed  Plan: 1.  Patient is currently in the ICU on BiPAP and in A. fib.  Would prefer to wait on endoscopic evaluation until patient is more stable.  If has any further signs of acute GI bleed would need to discuss doing this sooner/emergently 2.  Agree with pantoprazole IV twice daily 3.  Agree with blood transfusion 2 units PRBCs today and continued monitoring of hemoglobin with transfusion as needed less than 7 4.  Please await any final recommendations from Dr. Meridee Score later today  Thank you for your kind consultation, we will continue to follow.  Violet Baldy Inge Waldroup  01/28/2018, 1:15 PM

## 2018-01-28 NOTE — Progress Notes (Signed)
PROGRESS NOTE    Krystal Patrick  ZOX:[id number] DOB: Aug 22, 1920 DOA: 01/26/2018 PCP: Kaleen Mask, MD    Brief Narrative:   Krystal Patrick is a 84 y.o. female with medical history significant of HTN, CAD, AAA, and depression; who presents with altered mental status.   Patient just recently been hospitalized from 9/5-9/9, after having a fall with complaints of low back pain and sciatica.  Patient had been sent to [facility] where she was nearing discharge. On admission, pt was afebrile, tachycardic, hypotensive, elevated wbc count,  UA, and CXR negative. CT abd and pelvis showing 3.5 infra renal AAA aneurysm and esophagitis.  Her stool for occult blood was positive and her hemoglobin dropped from 9 to 7.1 today.  This morning she became hypoxic requiring BIPAP.     Assessment & Plan:   Principal Problem:   SIRS (systemic inflammatory response syndrome) (HCC) Active Problems:   AAA (abdominal aortic aneurysm) without rupture (HCC)   GERD (gastroesophageal reflux disease)   Acute metabolic encephalopathy   Hypotension   Acute blood loss anemia    Acute respiratory failure with hypoxia: Suspect secondary to fluid overload, mild acute on chronic diastolic heart failure.  She received about 4.8 liters of fluid since admission.  Transfer the patient to stepdown for possible BIPAP and closer monitoring. CXR showed CHF, with vascular congestion.  IV lasix 20 mg daily.  Strict intake and output.    Atrial fibrillation with RVR:  Possibly from CHF and demand ischemia from acute anemia. Troponins.  Metoprolol IV 5 mg every 6 hours prn for 140/min.  cardizem gtt ordered.    GI bleed/ acute anemia of blood loss:  Patient's baseline hemoglobin around 14, dropped to 7.1 today. Stool for occult blood is positive. CT abd and pelvis showed esophagitis and Possible diverticula of the medial wall of the second portion of the duodenum and Extensive sigmoid  diverticulosis. 2 units of prbc transfusion ordered. Post transfusion lasix IV 20 mg to be given.  GI consulted from [facility] for possible endoscopy.  IV PPI BID ordered.     Acute metabolic encephalopathy: suspect from the gabapentin that was recently started at [facility] for sciatica.  Initial CT head negative.  If she does not improve, will follow up with a MRI brain.    Nutrition:  Pt currently NPO .   SIRS on admission:  She was empirically started on IV antibiotics,but no source of infection found. Will d/c abx and monitor.    Leukocytosis:  Suspect reactive.     DVT prophylaxis:scd's Code Status: Full code.  Family Communication:  Daughter at bedside Disposition Plan: pending clinical improvement.    Consultants:   Gastroenterology Corinda Gubler.   Procedures: None   Antimicrobials: None.   Subjective: Confused.   Objective: Vitals:   01/28/18 0209 01/28/18 0226 01/28/18 0507 01/28/18 0917  BP: (!) 132/33 (!) 130/39 (!) 120/42   Pulse: 89 81 94 97  Resp: 20  18 20   Temp:   (!) 97.5 F (36.4 C)   TempSrc:   Axillary   SpO2: 100%  100% 95%  Weight:      Height:        Intake/Output Summary (Last 24 hours) at 01/28/2018 1128 Last data filed at 01/28/2018 1000 Gross per 24 hour  Intake 2311.63 ml  Output 1 ml  Net 2310.63 ml   Filed Weights   01/26/18 2126  Weight: 57 kg    Examination:  General exam: respiratory distress requiring currently on BIPAP.  Respiratory system: Basilar rales, diminished at bases.  Cardiovascular system: S1 & S2 heard, irregular, tachycardic. No JVD.  Gastrointestinal system: Abdomen is nondistended, soft and nontender.  Central nervous system: confused.  Extremities: trace edema on the upper extremities.  Skin: No rashes, lesions or ulcers Psychiatry: anxious.     Data Reviewed: I have personally reviewed following labs and imaging studies  CBC: Recent Labs  Lab 01/26/18 2211 01/27/18 1359 01/28/18 0311  WBC 29.8*  32.0* 23.2*  NEUTROABS 26.8* 29.3*  --   HGB 9.7* 8.9* 7.1*  HCT 29.4* 26.7* 22.1*  MCV 91.0 91.4 92.9  PLT PLATELET CLUMPS NOTED ON SMEAR, COUNT APPEARS ADEQUATE 531* 452*   Basic Metabolic Panel: Recent Labs  Lab 01/26/18 2211 01/27/18 1359 01/28/18 0311  NA 130* 136 138  K 5.4* 4.4 4.5  CL 94* 105 111  CO2 22 21* 22  GLUCOSE 129* 104* 109*  BUN 35* 29* 31*  CREATININE 0.93 0.67 0.64  CALCIUM 8.0* 7.5* 7.2*   GFR: Estimated Creatinine Clearance: 34 mL/min (by C-G formula based on SCr of 0.64 mg/dL). Liver Function Tests: Recent Labs  Lab 01/26/18 2211  AST 25  ALT 21  ALKPHOS 145*  BILITOT 1.5*  PROT 5.5*  ALBUMIN 2.4*   No results for input(s): LIPASE, AMYLASE in the last 168 hours. No results for input(s): AMMONIA in the last 168 hours. Coagulation Profile: No results for input(s): INR, PROTIME in the last 168 hours. Cardiac Enzymes: No results for input(s): CKTOTAL, CKMB, CKMBINDEX, TROPONINI in the last 168 hours. BNP (last 3 results) No results for input(s): PROBNP in the last 8760 hours. HbA1C: No results for input(s): HGBA1C in the last 72 hours. CBG: No results for input(s): GLUCAP in the last 168 hours. Lipid Profile: No results for input(s): CHOL, HDL, LDLCALC, TRIG, CHOLHDL, LDLDIRECT in the last 72 hours. Thyroid Function Tests: No results for input(s): TSH, T4TOTAL, FREET4, T3FREE, THYROIDAB in the last 72 hours. Anemia Panel: No results for input(s): VITAMINB12, FOLATE, FERRITIN, TIBC, IRON, RETICCTPCT in the last 72 hours. Sepsis Labs: Recent Labs  Lab 01/26/18 2233 01/27/18 1359  PROCALCITON  --  0.17  LATICACIDVEN 1.70  --     Recent Results (from the past 240 hour(s))  Blood Culture (routine x 2)     Status: None (Preliminary result)   Collection Time: 01/27/18 12:44 AM  Result Value Ref Range Status   Specimen Description   Final    BLOOD RIGHT UPPER ARM Performed at [facility], [address], [place], [place]  [address]    Special Requests   Final    BOTTLES DRAWN AEROBIC AND ANAEROBIC Blood Culture adequate volume Performed at [facility], [address] [place], [place] [address]    Culture PENDING  Incomplete   Report Status PENDING  Incomplete  MRSA PCR Screening     Status: None   Collection Time: 01/27/18 11:07 AM  Result Value Ref Range Status   MRSA by PCR NEGATIVE NEGATIVE Final    Comment:        The GeneXpert MRSA Assay (FDA approved for NASAL specimens only), is one component of a comprehensive MRSA colonization surveillance program. It is not intended to diagnose MRSA infection nor to guide or monitor treatment for MRSA infections. Performed at [facility], [address], [place], [place] [address]   Culture, Urine     Status: None   Collection Time: 01/27/18 11:26 AM  Result Value Ref  Range Status   Specimen Description   Final    Urine Performed at [facility], [address], [place], [place] [address]    Special Requests   Final    NONE Performed at [facility], [address], [place], [place] [address]    Culture   Final    NO GROWTH Performed at [facility], [address] [place], [place] [address]    Report Status 01/28/2018 FINAL  Final         Radiology Studies: Dg Lumbar Spine 2-3 Views  Result Date: 01/27/2018 CLINICAL DATA:  Pain across lower back, no injury EXAM: LUMBAR SPINE - 2-3 VIEW COMPARISON:  Earlier CT abdomen and pelvis of 01/27/2018 FINDINGS: Osseous demineralization. Five non-rib-bearing lumbar vertebra. Hypoplastic last ribs. Vertebral body heights maintained without fracture or bone destruction. Minimal anterolisthesis L4-L5 likely due to facet degenerative changes, which are present throughout the lower lumbar spine. SI joints preserved. Excreted contrast material within urinary bladder. Atherosclerotic calcifications aorta with fusiform aneurysmal  dilatation of the mid to distal abdominal aorta measuring 4.0 cm greatest AP diameter on lateral view, though this contains inherent radiographic magnification; the preceding CT abdomen and pelvis demonstrated maximal AP diameter of 3.4 cm at the same level. 7 mm nonobstructing inferior pole RIGHT renal calculus. IMPRESSION: Osseous demineralization with mild scattered degenerative facet disease changes of the lumbar spine. No acute lumbar spine abnormalities. Abdominal aortic aneurysm, please refer to preceding CT for accurate assessment Nonobstructing 7 mm RIGHT renal calculus. Electronically Signed   By: Ulyses Southward M.D.   On: 01/27/2018 14:56   Ct Head Wo Contrast  Result Date: 01/27/2018 CLINICAL DATA:  Altered mental status.  No reported injury. EXAM: CT HEAD WITHOUT CONTRAST TECHNIQUE: Contiguous axial images were obtained from the base of the skull through the vertex without intravenous contrast. COMPARISON:  05/30/2017 head CT. FINDINGS: Motion degraded scan, limiting assessment. Brain: Bilateral basal ganglia lacunes. No evidence of parenchymal hemorrhage or extra-axial fluid collection. No mass lesion, mass effect, or midline shift. No CT evidence of acute infarction. Generalized cerebral volume loss. Nonspecific moderate subcortical and periventricular white matter hypodensity, most in keeping with chronic small vessel ischemic change. Cerebral ventricle sizes are stable and concordant with the degree of cerebral volume loss. Vascular: No acute abnormality. Skull: No evidence of calvarial fracture. Sinuses/Orbits: Partial opacification of the sphenoid sinus and posterior right ethmoidal air cells, similar to prior. Other:  The mastoid air cells are unopacified. IMPRESSION: 1. Limited motion degraded scan. 2.  No evidence of acute intracranial abnormality. 3. Generalized cerebral volume loss with moderate chronic small vessel ischemic changes in the cerebral white matter. 4. Chronic paranasal  sinusitis. Electronically Signed   By: Delbert Phenix M.D.   On: 01/27/2018 02:48   Ct Abdomen Pelvis W Contrast  Result Date: 01/27/2018 CLINICAL DATA:  84 year old female with sepsis. EXAM: CT ABDOMEN AND PELVIS WITH CONTRAST TECHNIQUE: Multidetector CT imaging of the abdomen and pelvis was performed using the standard protocol following bolus administration of intravenous contrast. CONTRAST:  75mL OMNIPAQUE IOHEXOL 300 MG/ML  SOLN COMPARISON:  Chest CT dated 01/08/2018 FINDINGS: Evaluation of this exam is limited in the absence of intravenous contrast. Lower chest: Partially visualized small right pleural effusion and subsegmental right lung base atelectatic changes. Calcified right lung base granuloma. Multi vessel coronary vascular calcification and calcification of the mitral annulus. No intra-abdominal free air or free fluid. Hepatobiliary: The liver is unremarkable. No intrahepatic biliary ductal  dilatation. Cholecystectomy. Pancreas: Unremarkable. No pancreatic ductal dilatation or surrounding inflammatory changes. Spleen: Normal in size without focal abnormality. Adrenals/Urinary Tract: The adrenal glands are unremarkable. There is no hydronephrosis or nephrolithiasis on either side. There is a 5 cm right renal inferior pole cyst. There is symmetric enhancement and excretion of contrast by both kidneys. A 2.5 cm hypodense lesion arising from the superior pole of the left kidney is not well characterized but may represent a complex or septated cyst. This can be further evaluated with ultrasound or MRI on a nonemergent basis. Duplicated bilateral renal collecting systems. The urinary bladder is unremarkable. Stomach/Bowel: There is diffuse circumferential thickening of the esophagus most consistent with esophagitis. There is apparent thickening of the gastroesophageal junction which may be inflammatory in nature although a neoplasm is not excluded. Further evaluation with endoscopy after resolution of  active inflammatory changes of the esophagus is recommended. Small pocket of gas along the medial wall of the second portion of the duodenum is suboptimally evaluated but may represent gas within the lumen although duodenal diverticulum or ulcer is not excluded. Evaluation of this area is very limited on this CT. There is extensive sigmoid diverticulosis without active inflammatory changes. Large amount of stool noted in the distal colon. There is no bowel obstruction. Vascular/Lymphatic: Advanced aortoiliac atherosclerotic disease. There is a 3.5 cm infrarenal aortic aneurysm. Reproductive: Hysterectomy.  No pelvic mass. Other: None Musculoskeletal: Osteopenia with degenerative changes of the spine. Grade 1 L4-L5 anterolisthesis. Probable old healed sacral fracture. No acute osseous pathology. IMPRESSION: 1. Circumferential thickening and inflammation of the esophagus most consistent with esophagitis. Soft tissue thickening of the gastroesophageal junction may be inflammatory or neoplastic. Further evaluation with endoscopy following resolution of the acute esophageal inflammation recommended. 2. Possible diverticula of the medial wall of the second portion of the duodenum. 3. Extensive sigmoid diverticulosis.  No bowel obstruction. 4. Advanced choose 1 5. A 3.5 cm infrarenal aortic aneurysm. Recommend followup by ultrasound in 2 years. This recommendation follows ACR consensus guidelines: White Paper of the ACR Incidental Findings Committee II on Vascular Findings. J Am Coll Radiol 2013; 10:789-794. Electronically Signed   By: Elgie Collard M.D.   On: 01/27/2018 03:39   Dg Chest Port 1 View  Result Date: 01/27/2018 CLINICAL DATA:  84 year old female with hypotension and altered mental status. EXAM: PORTABLE CHEST 1 VIEW COMPARISON:  Chest radiograph dated 01/09/2018 FINDINGS: The lungs are clear. There is no pleural effusion or pneumothorax. There is mild cardiomegaly. There is calcification of the mitral  annulus as well as atherosclerotic calcification of the thoracic aorta. No acute osseous pathology. IMPRESSION: No active disease. Electronically Signed   By: Elgie Collard M.D.   On: 01/27/2018 02:16        Scheduled Meds: . sodium chloride   Intravenous Once  . capsaicin  1 application Topical Daily  . feeding supplement (ENSURE ENLIVE)  237 mL Oral BID BM  . gabapentin  300 mg Oral Daily  . pantoprazole  40 mg Intravenous Q12H  . sodium chloride flush  10-40 mL Intracatheter Q12H  . sodium chloride flush  3 mL Intravenous Q12H   Continuous Infusions: . sodium chloride Stopped (01/27/18 2132)     LOS: 1 day    Time spent: 35 minutes.    Kathlen Mody, MD [facility] Pager [phone]  If 7PM-7AM, please contact night-coverage [email or web address] Password [facility] 01/28/2018, 11:28 AM

## 2018-01-28 NOTE — Progress Notes (Signed)
Pt is resting comfortably in bed. WOB and agitation have decreased. Pt still reading NSR on the monitor in the 80s. Will continue to monitor closely.

## 2018-01-28 NOTE — Progress Notes (Signed)
[id number]

## 2018-01-28 NOTE — Progress Notes (Addendum)
Patient recently seen by [facility] primary care and does belong to [facility]. I have called in a consult and they will be seeing the patient later today.  Addendum: Patient only seen at nursing facility and not in [place] office, therefore we will see pt in consult.  Hyacinth Meeker, PA-C [facility]

## 2018-01-28 NOTE — Progress Notes (Signed)
Pt stated that she felt dizzy. Pt is more agitated than before with increased WOB and HR still sustaining in the 110s-130s despite lopressor 5mg , digoxin 0.25mg , and Cardizem 10mg  given. NP on call paged and orders placed to start Cardizem drip. Before drip was initiated pts HR dropped to the 40s and then began to sustain in the 80s. Rapid RN called to bedside. EKG obtained and found patient in NSR. NP gave instructions to hold Cardizem drip d/t Pt converting to NSR. Pt still having some increased WOB with slight agitation. NP placed one time dose of IV ativan. Will continue to monitor pt closely.

## 2018-01-28 NOTE — Consult Note (Signed)
Cardiology Consultation:   Patient ID: Shaunna Rosetti; [id number]; [birth year]   Admit date: 01/26/2018 Date of Consult: 01/28/2018  Primary Care Provider: Kaleen Mask, MD Primary Cardiologist: No primary care provider on file.  Primary Electrophysiologist:  None   Patient Profile:   Jatoya Armbrister is a 84 y.o. female with no significant cardiac history who is being seen today for the evaluation of atrial fib at the request of Dr. Blake Divine.  History of Present Illness:   Ms. January  was transported from a nursing home with AM.   She has hypoxic respiratory failure and is requiring BiPAP.  She is being treated for possible sepsis with a urinary source.  She also has anemia with possible acute blood loss and melena.  We are called because she is in atrial fib.  She was in atrial fib in the ED but converted to NSR.  She has subsequently reverted back to atrial fibrillation.  Her rate is slightly elevated although it does come down when she is not agitated.  She is oxygenating well on BiPAP.  Hemodynamically she is stable.  She is unable to give any history as she is confused and agitated when she is awake.  She does not like the BiPAP.  I do not see any past cardiac history.  She did have an echo already today with results as below and has normal systolic function though there is evidence of pulmonary hypertension and diastolic dysfunction.  She was recently in the hospital with near syncope.  She was noted to have a prolonged QT.  However, her syncope was felt to be vagal.  The QT normalized.  She had hypoxia and was felt to have some emphysema.  She was discharged from here to the nursing home.  Past Medical History:  Diagnosis Date  . AAA (abdominal aortic aneurysm) (HCC)   . CAD (coronary artery disease)   . GERD (gastroesophageal reflux disease)   . Hypertension     Past Surgical History:  Procedure Laterality Date  . CHOLECYSTECTOMY    . SMALL BOWEL REPAIR       Prior to Admission medications   Medication Sig Start Date End Date Taking? Authorizing Provider  acetaminophen (TYLENOL) 650 MG CR tablet Take 650 mg by mouth every 8 (eight) hours as needed for pain.   Yes [provider]  amLODipine (NORVASC) 10 MG tablet Take 10 mg by mouth daily.    Yes [provider]  aspirin EC 81 MG tablet Take 81 mg by mouth daily.   Yes [provider]  capsaicin (ARTHRITIS PAIN RELIEF) 0.025 % cream Apply 1 application topically daily.   Yes [provider]  Cholecalciferol (VITAMIN D3) 10000 units TABS Take 1,000 Units by mouth daily.   Yes [provider]  furosemide (LASIX) 20 MG tablet Take 20 mg by mouth daily. Monday, wednesday, and friday   Yes [provider]  gabapentin (NEURONTIN) 300 MG capsule Take 1 capsule (300 mg total) by mouth 3 (three) times daily. 01/11/18  Yes Randel Pigg, Dorma Russell, MD  lisinopril (PRINIVIL,ZESTRIL) 40 MG tablet Take 40 mg by mouth daily.   Yes [provider]  Melatonin 5 MG TABS Take 5 mg by mouth at bedtime as needed (sleep).   Yes [provider]  methocarbamol (ROBAXIN) 500 MG tablet Take 500 mg by mouth every 8 (eight) hours.   Yes [provider]  methocarbamol (ROBAXIN) 750 MG tablet Take 1 tablet (750 mg total) by mouth every  8 (eight) hours as needed for muscle spasms. 01/11/18  Yes Randel PiggSilva Zapata, Dorma RussellEdwin, MD  metoprolol tartrate (LOPRESSOR) 25 MG tablet Take 25 mg by mouth 2 (two) times daily.   Yes [provider]  Nutritional Supplements (NUTRITIONAL DRINK PO) Take 90 mLs by mouth 2 (two) times daily.   Yes [provider]  polyethylene glycol (MIRALAX / GLYCOLAX) packet Take 17 g by mouth daily as needed for mild constipation. 01/11/18  Yes Randel PiggSilva Zapata, Dorma RussellEdwin, MD  traMADol (ULTRAM) 50 MG tablet Take 0.5 tablets (25 mg total) by mouth every 12 (twelve) hours as needed for moderate pain. Patient taking differently: Take 25 mg by  mouth every 12 (twelve) hours as needed for moderate pain. And 50 mg every 6 hours for sciatic pain until 01/27/18 01/11/18  Yes Randel PiggSilva Zapata, Dorma RussellEdwin, MD  vitamin B-12 (CYANOCOBALAMIN) 500 MCG tablet Take 500 mcg by mouth daily.   Yes [provider]     Inpatient Medications: Scheduled Meds: . sodium chloride   Intravenous Once  . capsaicin  1 application Topical Daily  . feeding supplement (ENSURE ENLIVE)  237 mL Oral BID BM  . furosemide  20 mg Intravenous Once  . gabapentin  300 mg Oral Daily  . pantoprazole  40 mg Intravenous Q12H  . sodium chloride flush  10-40 mL Intracatheter Q12H  . sodium chloride flush  3 mL Intravenous Q12H   Continuous Infusions: . diltiazem (CARDIZEM) infusion     PRN Meds: acetaminophen **OR** acetaminophen, albuterol, Melatonin, metoprolol tartrate, ondansetron **OR** ondansetron (ZOFRAN) IV, sodium chloride flush, traMADol  Allergies:    Allergies  Allergen Reactions  . Penicillins Rash    Has patient had a PCN reaction causing immediate rash, facial/tongue/throat swelling, SOB or lightheadedness with hypotension: Unknown Has patient had a PCN reaction causing severe rash involving mucus membranes or skin necrosis: Unknown Has patient had a PCN reaction that required hospitalization: No Has patient had a PCN reaction occurring within the last 10 years: No If all of the above answers are "NO", then may proceed with Cephalosporin use.     Social History:   Social History   Socioeconomic History  . Marital status: Widowed    Spouse name: Not on file  . Number of children: Not on file  . Years of education: Not on file  . Highest education level: Not on file  Occupational History  . Not on file  Social Needs  . Financial resource strain: Not on file  . Food insecurity:    Worry: Not on file    Inability: Not on file  . Transportation needs:    Medical: Not on file    Non-medical: Not on file  Tobacco Use  . Smoking status:  Former Smoker    Types: Cigarettes    Last attempt to quit: 09/05/1989    Years since quitting: 28.4  . Smokeless tobacco: Never Used  Substance and Sexual Activity  . Alcohol use: No    Alcohol/week: 0.0 standard drinks  . Drug use: No  . Sexual activity: Not on file  Lifestyle  . Physical activity:    Days per week: Not on file    Minutes per session: Not on file  . Stress: Not on file  Relationships  . Social connections:    Talks on phone: Not on file    Gets together: Not on file    Attends religious service: Not on file    Active member of club or organization: Not on  file    Attends meetings of clubs or organizations: Not on file    Relationship status: Not on file  . Intimate partner violence:    Fear of current or ex partner: Not on file    Emotionally abused: Not on file    Physically abused: Not on file    Forced sexual activity: Not on file  Other Topics Concern  . Not on file  Social History Narrative  . Not on file    Family History:   Unable to obtain from the patient.  Family History  Problem Relation Age of Onset  . Heart disease Father   . Heart attack Father   . Diabetes Daughter   . Hypertension Daughter      ROS:  Unable to obtain from the patient.    Physical Exam/Data:   Vitals:   01/28/18 1330 01/28/18 1445 01/28/18 1500 01/28/18 1514  BP: (!) 95/34 (!) 119/93  (!) 113/56  Pulse: (!) 105 85 (!) 131 (!) 25  Resp: (!) 24 (!) 26 (!) 31 (!) 25  Temp:  (!) 97.4 F (36.3 C)  (!) 97.4 F (36.3 C)  TempSrc:  Axillary  Axillary  SpO2: 97% 100% 94% 100%  Weight:      Height:        Intake/Output Summary (Last 24 hours) at 01/28/2018 1620 Last data filed at 01/28/2018 1600 Gross per 24 hour  Intake 2358.3 ml  Output -  Net 2358.3 ml   Filed Weights   01/26/18 2126  Weight: 57 kg   Body mass index is 24.54 kg/m.  GENERAL:  Acutely ill appearing NECK:   Unable to assess jugular venous distention with the patient lying flat and  apparatus is intact., waveform within normal limits, carotid upstroke brisk and symmetric, no bruits, no thyromegaly LUNGS: Decreased breath sounds diffusely CHEST:   Unremarkable HEART:  PMI not displaced or sustained,S1 and S2 within normal limits, no S3, n no clicks, no rubs, no murmurs, irregular ABD:  Flat, positive bowel sounds normal in frequency in pitch, no bruits, no rebound, no guarding, no midline pulsatile mass, no hepatomegaly, no splenomegaly EXT:  2 plus pulses upper, decreased dorsalis pedis and posterior tibialis bilateral SKIN:  No rashes no nodules NEURO: Nonfocal moves all extremities PSYCH:  Unable to ass   EKG:  The EKG was personally reviewed and demonstrates: Atrial fibrillation, rate 131, diffuse mild ST segment depression Telemetry:  Telemetry was personally reviewed and demonstrates:  Atrial fib with RVR  Relevant CV Studies:  ECHO:  01/09/18  Study Conclusions  - Left ventricle: The cavity size was normal. Wall thickness was   normal. Systolic function was vigorous. The estimated ejection   fraction was in the range of 65% to 70%. Wall motion was normal;   there were no regional wall motion abnormalities. Doppler   parameters are consistent with abnormal left ventricular   relaxation (grade 1 diastolic dysfunction). - Aortic valve: There was no stenosis. - Mitral valve: Moderately calcified annulus. There was trivial   regurgitation. - Right ventricle: The cavity size was normal. Systolic function   was normal. - Tricuspid valve: Peak RV-RA gradient (S): 60 mm Hg. - Pulmonary arteries: PA peak pressure: 63 mm Hg (S). - Inferior vena cava: The vessel was normal in size. The   respirophasic diameter changes were in the normal range (= 50%),   consistent with normal central venous pressure.  Impressions:  - Normal LV size with EF 65-70%. Normal RV size  and systolic   function. No significant valvular abnormalities. Moderate   pulmonary  hypertension.  Laboratory Data:  Chemistry Recent Labs  Lab 01/26/18 2211 01/27/18 1359 01/28/18 0311  NA 130* 136 138  K 5.4* 4.4 4.5  CL 94* 105 111  CO2 22 21* 22  GLUCOSE 129* 104* 109*  BUN 35* 29* 31*  CREATININE 0.93 0.67 0.64  CALCIUM 8.0* 7.5* 7.2*  GFRNONAA 51* >60 >60  GFRAA 59* >60 >60  ANIONGAP 14 10 5     Recent Labs  Lab 01/26/18 2211  PROT 5.5*  ALBUMIN 2.4*  AST 25  ALT 21  ALKPHOS 145*  BILITOT 1.5*   Hematology Recent Labs  Lab 01/26/18 2211 01/27/18 1359 01/28/18 0311  WBC 29.8* 32.0* 23.2*  RBC 3.23* 2.92* 2.38*  HGB 9.7* 8.9* 7.1*  HCT 29.4* 26.7* 22.1*  MCV 91.0 91.4 92.9  MCH 30.0 30.5 29.8  MCHC 33.0 33.3 32.1  RDW 14.1 14.2 14.5  PLT PLATELET CLUMPS NOTED ON SMEAR, COUNT APPEARS ADEQUATE 531* 452*   Cardiac EnzymesNo results for input(s): TROPONINI in the last 168 hours.  Recent Labs  Lab 01/27/18 0057  TROPIPOC 0.06    BNPNo results for input(s): BNP, PROBNP in the last 168 hours.  DDimer No results for input(s): DDIMER in the last 168 hours.  Radiology/Studies:  Dg Lumbar Spine 2-3 Views  Result Date: 01/27/2018 CLINICAL DATA:  Pain across lower back, no injury EXAM: LUMBAR SPINE - 2-3 VIEW COMPARISON:  Earlier CT abdomen and pelvis of 01/27/2018 FINDINGS: Osseous demineralization. Five non-rib-bearing lumbar vertebra. Hypoplastic last ribs. Vertebral body heights maintained without fracture or bone destruction. Minimal anterolisthesis L4-L5 likely due to facet degenerative changes, which are present throughout the lower lumbar spine. SI joints preserved. Excreted contrast material within urinary bladder. Atherosclerotic calcifications aorta with fusiform aneurysmal dilatation of the mid to distal abdominal aorta measuring 4.0 cm greatest AP diameter on lateral view, though this contains inherent radiographic magnification; the preceding CT abdomen and pelvis demonstrated maximal AP diameter of 3.4 cm at the same level. 7 mm  nonobstructing inferior pole RIGHT renal calculus. IMPRESSION: Osseous demineralization with mild scattered degenerative facet disease changes of the lumbar spine. No acute lumbar spine abnormalities. Abdominal aortic aneurysm, please refer to preceding CT for accurate assessment Nonobstructing 7 mm RIGHT renal calculus. Electronically Signed   By: Ulyses Southward M.D.   On: 01/27/2018 14:56   Ct Head Wo Contrast  Result Date: 01/27/2018 CLINICAL DATA:  Altered mental status.  No reported injury. EXAM: CT HEAD WITHOUT CONTRAST TECHNIQUE: Contiguous axial images were obtained from the base of the skull through the vertex without intravenous contrast. COMPARISON:  05/30/2017 head CT. FINDINGS: Motion degraded scan, limiting assessment. Brain: Bilateral basal ganglia lacunes. No evidence of parenchymal hemorrhage or extra-axial fluid collection. No mass lesion, mass effect, or midline shift. No CT evidence of acute infarction. Generalized cerebral volume loss. Nonspecific moderate subcortical and periventricular white matter hypodensity, most in keeping with chronic small vessel ischemic change. Cerebral ventricle sizes are stable and concordant with the degree of cerebral volume loss. Vascular: No acute abnormality. Skull: No evidence of calvarial fracture. Sinuses/Orbits: Partial opacification of the sphenoid sinus and posterior right ethmoidal air cells, similar to prior. Other:  The mastoid air cells are unopacified. IMPRESSION: 1. Limited motion degraded scan. 2.  No evidence of acute intracranial abnormality. 3. Generalized cerebral volume loss with moderate chronic small vessel ischemic changes in the cerebral white matter. 4. Chronic paranasal sinusitis. Electronically Signed  By: Delbert Phenix M.D.   On: 01/27/2018 02:48   Ct Abdomen Pelvis W Contrast  Result Date: 01/27/2018 CLINICAL DATA:  84 year old female with sepsis. EXAM: CT ABDOMEN AND PELVIS WITH CONTRAST TECHNIQUE: Multidetector CT imaging of the  abdomen and pelvis was performed using the standard protocol following bolus administration of intravenous contrast. CONTRAST:  75mL OMNIPAQUE IOHEXOL 300 MG/ML  SOLN COMPARISON:  Chest CT dated 01/08/2018 FINDINGS: Evaluation of this exam is limited in the absence of intravenous contrast. Lower chest: Partially visualized small right pleural effusion and subsegmental right lung base atelectatic changes. Calcified right lung base granuloma. Multi vessel coronary vascular calcification and calcification of the mitral annulus. No intra-abdominal free air or free fluid. Hepatobiliary: The liver is unremarkable. No intrahepatic biliary ductal dilatation. Cholecystectomy. Pancreas: Unremarkable. No pancreatic ductal dilatation or surrounding inflammatory changes. Spleen: Normal in size without focal abnormality. Adrenals/Urinary Tract: The adrenal glands are unremarkable. There is no hydronephrosis or nephrolithiasis on either side. There is a 5 cm right renal inferior pole cyst. There is symmetric enhancement and excretion of contrast by both kidneys. A 2.5 cm hypodense lesion arising from the superior pole of the left kidney is not well characterized but may represent a complex or septated cyst. This can be further evaluated with ultrasound or MRI on a nonemergent basis. Duplicated bilateral renal collecting systems. The urinary bladder is unremarkable. Stomach/Bowel: There is diffuse circumferential thickening of the esophagus most consistent with esophagitis. There is apparent thickening of the gastroesophageal junction which may be inflammatory in nature although a neoplasm is not excluded. Further evaluation with endoscopy after resolution of active inflammatory changes of the esophagus is recommended. Small pocket of gas along the medial wall of the second portion of the duodenum is suboptimally evaluated but may represent gas within the lumen although duodenal diverticulum or ulcer is not excluded. Evaluation of  this area is very limited on this CT. There is extensive sigmoid diverticulosis without active inflammatory changes. Large amount of stool noted in the distal colon. There is no bowel obstruction. Vascular/Lymphatic: Advanced aortoiliac atherosclerotic disease. There is a 3.5 cm infrarenal aortic aneurysm. Reproductive: Hysterectomy.  No pelvic mass. Other: None Musculoskeletal: Osteopenia with degenerative changes of the spine. Grade 1 L4-L5 anterolisthesis. Probable old healed sacral fracture. No acute osseous pathology. IMPRESSION: 1. Circumferential thickening and inflammation of the esophagus most consistent with esophagitis. Soft tissue thickening of the gastroesophageal junction may be inflammatory or neoplastic. Further evaluation with endoscopy following resolution of the acute esophageal inflammation recommended. 2. Possible diverticula of the medial wall of the second portion of the duodenum. 3. Extensive sigmoid diverticulosis.  No bowel obstruction. 4. Advanced choose 1 5. A 3.5 cm infrarenal aortic aneurysm. Recommend followup by ultrasound in 2 years. This recommendation follows ACR consensus guidelines: White Paper of the ACR Incidental Findings Committee II on Vascular Findings. J Am Coll Radiol 2013; 10:789-794. Electronically Signed   By: Elgie Collard M.D.   On: 01/27/2018 03:39   Dg Chest Port 1 View  Result Date: 01/28/2018 CLINICAL DATA:  Acute respiratory distress EXAM: PORTABLE CHEST 1 VIEW COMPARISON:  01/27/2018 FINDINGS: The heart remains moderately enlarged. Vascular congestion. Mild interstitial edema. Small pleural effusions are suspected. IMPRESSION: CHF with mild pulmonary edema and suspected small pleural effusions. Electronically Signed   By: Jolaine Click M.D.   On: 01/28/2018 12:57   Dg Chest Port 1 View  Result Date: 01/27/2018 CLINICAL DATA:  84 year old female with hypotension and altered mental status. EXAM: PORTABLE CHEST  1 VIEW COMPARISON:  Chest radiograph dated  01/09/2018 FINDINGS: The lungs are clear. There is no pleural effusion or pneumothorax. There is mild cardiomegaly. There is calcification of the mitral annulus as well as atherosclerotic calcification of the thoracic aorta. No acute osseous pathology. IMPRESSION: No active disease. Electronically Signed   By: Elgie Collard M.D.   On: 01/27/2018 02:16    Assessment and Plan:    ACUTE RESPIRATORY FAILURE: She does have respiratory failure but is oxygenating well.  Diuresis as tolerated.  She has previously preserved ejection fraction with some evidence of diastolic dysfunction.  CHF on CXR.    ATRIAL FIB: The patient has atrial fibrillation with rapid rate.  This is in keeping with her other acute illness.  This exacerbates the respiratory failure I am sure.  She is going to receive diuresis after her unit of blood and might require a second bolus depending on her output.  For now we will support her with transfusion and treating her agitation and anxiety.  If necessary we can start a low-dose of Cardizem IV.  She is not an anticoagulation candidate.  PULMONARY HTN:   This is probably secondary to diastolic dysfunction and will be managed conservatively.  For questions or updates, please contact [facility] Please consult [email or web address] for contact info under Cardiology/STEMI.   Signed, Rollene Rotunda, MD  01/28/2018 4:20 PM

## 2018-01-28 NOTE — Evaluation (Addendum)
Clinical/Bedside Swallow Evaluation Patient Details  Name: Krystal Patrick MRN: [id number] Date of Birth: 28-Sep-1920  Today's Date: 01/28/2018 Time: SLP Start Time (ACUTE ONLY): 1119 SLP Stop Time (ACUTE ONLY): 1150 SLP Time Calculation (min) (ACUTE ONLY): 31 min  Past Medical History:  Past Medical History:  Diagnosis Date  . AAA (abdominal aortic aneurysm) (HCC)   . CAD (coronary artery disease)   . GERD (gastroesophageal reflux disease)   . Hypertension    Past Surgical History:  Past Surgical History:  Procedure Laterality Date  . CHOLECYSTECTOMY    . SMALL BOWEL REPAIR     HPI:  Patient is a 84 yr old female admit with increased cough/congestion from SNF.  Recent admit 01/08/2018 with diagnosis of near syncopal episode and fall at home 5 days prior to admission.  PMH: AAA, HTN, CAD, GERD, depression.  Swallow evaluation ordered.  Family present and reports pt had increased cough over the last few weeks and reported problems swallowing stating it was difficult to get food down and effortful swallow with poor intake.  CXR negative yesterday.  Imaging study showed esophagitis with rec for endoscopy after acute esoph issues resolve.     Assessment / Plan / Recommendation Clinical Impression  Currently pt is aspirating secretions, grossly weak, is excessively xerostomic and unable to follow directions due to ? agitation, confusion.  Provided oral moisture to patient via toothette - delayed swallow with immediate post-swallow weak congested cough noted.  Pt able to cough and propel secretions into pharynx x1 with max/total cues but this was exhausting for her.  Family present including daughter Avon Gully, niece, and female family member present and educated to need for NPO.  Oral suction set up and demonstrated usage to them to uses only at anterior oral cavity IF pt able to cough/expel secretions into oral cavity.  Pt did not clear her secretions during entire session despite cues.    At  this time, this pt is not appropriate for po intake due to respiratory status, aspiration of secretions.  Requested family leave pt sitting upright to help clear secretions - they report sciatic nerve pain and reviewed airway protection needs.   Of note, RN came in during session and increased pt's oxygen to improve saturation.  Rapid Response was called by RN after SLP session.   Recommend consider palliative consult given pt's advanced age, recent admission and respiratory status.   SLP Visit Diagnosis: Dysphagia, oropharyngeal phase (R13.12)    Aspiration Risk  Severe aspiration risk;Risk for inadequate nutrition/hydration    Diet Recommendation NPO(oral moisture via toothette)   Medication Administration: Via alternative means    Other  Recommendations Oral Care Recommendations: Oral care QID   Follow up Recommendations        Frequency and Duration min 1 x/week          Prognosis        Swallow Study   General Date of Onset: 01/08/18 HPI: Patient is a 84 yr old female admit with increased cough/congestion from SNF.  Recent admit 01/08/2018 with diagnosis of near syncopal episode and fall at home 5 days prior to admission.  PMH: AAA, HTN, CAD, GERD, depression.  Swallow evaluation ordered.  Family present and reports pt had increased cough over the last few weeks and reported problems swallowing stating it was difficult to get food down and effortful swallow with poor intake.  CXR negative yesterday.  Imaging study showed esophagitis with rec for endoscopy after acute esoph issues resolve.   Previous  Swallow Assessment: Bedside swallow in January 2019 recommended regular diet and thin liquids Diet Prior to this Study: NPO Respiratory Status: Nasal cannula(per family on room air at facility) History of Recent Intubation: No Behavior/Cognition: Alert;Agitated;Confused;Distractible;Other (Comment)(pain) Oral Cavity Assessment: Dry Oral Care Completed by SLP: Alvira Monday and not  indicated) Oral Cavity - Dentition: Missing dentition;Other (Comment)(upper present) Vision: Impaired for self-feeding Patient Positioning: Other (comment)(fidgety) Baseline Vocal Quality: Hoarse;Breathy Volitional Cough: Weak Volitional Swallow: Unable to elicit    Oral/Motor/Sensory Function Overall Oral Motor/Sensory Function: Generalized oral weakness   Ice Chips Ice chips: Not tested   Thin Liquid Thin Liquid: (oral moisture via toothette) Pharyngeal  Phase Impairments: Suspected delayed Swallow;Cough - Immediate    Nectar Thick Nectar Thick Liquid: Not tested   Honey Thick Honey Thick Liquid: Not tested   Puree Puree: Not tested   Solid     Solid: Not tested      Krystal Patrick 01/28/2018,12:13 PM   Donavan Burnet, MS [facility] SLP Acute Rehab Services Pager [phone] Office [phone]

## 2018-01-28 NOTE — Progress Notes (Addendum)
Rapid Response Event Note  Overview: called to bedside for a-fib RVR and increased oxygen demand      Initial Focused Assessment: when arrived in room, patient placed on NRB 15L by RT. Lung sounds crackles bilaterally, patient oriented to place, person. See VS flowsheets.    Interventions: Per MD order, administered Cardizem bolus Placed patient on NRB PCXR order placed by MD  Plan of Care (if not transferred): transfer to SDU room 1223.   Event Summary:   Krystal Patrick

## 2018-01-29 LAB — TYPE AND SCREEN
ABO/RH(D): O POS
ANTIBODY SCREEN: NEGATIVE
UNIT DIVISION: 0
Unit division: 0

## 2018-01-29 LAB — BPAM RBC
Blood Product Expiration Date: 201910202359
Blood Product Expiration Date: 201910222359
ISSUE DATE / TIME: 201909251838
ISSUE DATE / TIME: 201909251447
UNIT TYPE AND RH: 5100
Unit Type and Rh: 5100

## 2018-01-29 LAB — BASIC METABOLIC PANEL
ANION GAP: 11 (ref 5–15)
BUN: 23 mg/dL (ref 8–23)
CO2: 20 mmol/L — AB (ref 22–32)
Calcium: 8 mg/dL — ABNORMAL LOW (ref 8.9–10.3)
Chloride: 112 mmol/L — ABNORMAL HIGH (ref 98–111)
Creatinine, Ser: 0.81 mg/dL (ref 0.44–1.00)
GFR calc non Af Amer: >60 mL/min (ref >60)
GLUCOSE: 74 mg/dL (ref 70–99)
POTASSIUM: 3.7 mmol/L (ref 3.5–5.1)
Sodium: 143 mmol/L (ref 135–145)

## 2018-01-29 LAB — CBC
HEMATOCRIT: 36.2 % (ref 36.0–46.0)
Hemoglobin: 11.8 g/dL — ABNORMAL LOW (ref 12.0–15.0)
MCH: 29.1 pg (ref 26.0–34.0)
MCHC: 32.6 g/dL (ref 30.0–36.0)
MCV: 89.2 fL (ref 78.0–100.0)
Platelets: 399 10*3/uL (ref 150–400)
RBC: 4.06 MIL/uL (ref 3.87–5.11)
RDW: 15.4 % (ref 11.5–15.5)
WBC: 17.7 10*3/uL — AB (ref 4.0–10.5)

## 2018-01-29 LAB — HEMOGLOBIN AND HEMATOCRIT, BLOOD
HEMATOCRIT: 37.5 % (ref 36.0–46.0)
HEMOGLOBIN: 12.5 g/dL (ref 12.0–15.0)

## 2018-01-29 LAB — LACTIC ACID, PLASMA
Lactic Acid, Venous: 2.4 mmol/L (ref 0.5–1.9)
Lactic Acid, Venous: 1.3 mmol/L (ref 0.5–1.9)

## 2018-01-29 LAB — TROPONIN I
Troponin I: 3.08 ng/mL (ref <0.03)
Troponin I: 4.11 ng/mL (ref <0.03)
Troponin I: 3.51 ng/mL (ref <0.03)

## 2018-01-29 MED ORDER — METOPROLOL TARTRATE 25 MG PO TABS
25.0000 mg | ORAL_TABLET | Freq: Two times a day (BID) | ORAL | Status: DC
  Administered 2018-01-29 – 2018-01-30 (×3): 25 mg via ORAL
  Filled 2018-01-29 (×4): qty 1

## 2018-01-29 MED ORDER — FUROSEMIDE 10 MG/ML IJ SOLN
20.0000 mg | Freq: Two times a day (BID) | INTRAMUSCULAR | Status: DC
  Administered 2018-01-29 – 2018-02-01 (×8): 20 mg via INTRAVENOUS
  Filled 2018-01-29 (×8): qty 2

## 2018-01-29 MED ORDER — SODIUM CHLORIDE 0.9 % IV BOLUS
500.0000 mL | Freq: Once | INTRAVENOUS | Status: AC
  Administered 2018-01-29: 500 mL via INTRAVENOUS

## 2018-01-29 MED ORDER — RESOURCE THICKENUP CLEAR PO POWD
ORAL | Status: DC | PRN
  Filled 2018-01-29: qty 125

## 2018-01-29 NOTE — Progress Notes (Signed)
Upon request from Dr.Schorr, Paged Cardiology about critical troponin 3.08. Wasik from Cardiology answered page. Got verbal order for additional troponin to be drawn for 0730am per conversation.

## 2018-01-29 NOTE — Progress Notes (Signed)
CRITICAL VALUE ALERT  Critical Value:  Lactic 2.4  Date & Time Notied:  9/26 01:11am  Provider Notified: Dr.Schorr  Orders Received/Actions taken: 500cc bolus

## 2018-01-29 NOTE — Progress Notes (Signed)
Progress Note   Subjective  Chief Complaint: Abnormal Ct Esophagus, Anemia  This morning patient found sitting up in bed breathing room air, her daughter is by her bedside.  She does seem much more alert today.  Daughter discusses history of acute onset of dysphagia over the past 2 weeks, she had never had problems previous to this.  Patient also describes some epigastric pain after eating on occasion.   Objective   Vital signs in last 24 hours: Temp:  [96.1 F (35.6 C)-98 F (36.7 C)] 97.3 F (36.3 C) (09/26 0803) Pulse Rate:  [25-131] 83 (09/26 0400) Resp:  [14-38] 19 (09/26 0400) BP: (83-167)/(33-93) 142/42 (09/26 0400) SpO2:  [92 %-100 %] 100 % (09/26 0400) FiO2 (%):  [70 %] 70 % (09/26 0400) Last BM Date: 01/27/18 General:    Elderly frail appearing white female in NAD Heart:  Regular rate and rhythm; no murmurs Lungs: Respirations even and unlabored, lungs CTA bilaterally Abdomen:  Soft, nontender and nondistended. Normal bowel sounds. Extremities:  Without edema. Neurologic:  Alert and oriented,  grossly normal neurologically. Psych:  Cooperative. Normal mood and affect.  Intake/Output from previous day: 09/25 0701 - 09/26 0700 In: 655.7 [P.O.:120; I.V.:19.7; Blood:366; IV Piggyback:150] Out: 1050 [Urine:1050]  Lab Results: Recent Labs    01/26/18 2211 01/27/18 1359 01/28/18 0311 01/29/18 0003  WBC 29.8* 32.0* 23.2*  --   HGB 9.7* 8.9* 7.1* 12.5  HCT 29.4* 26.7* 22.1* 37.5  PLT PLATELET CLUMPS NOTED ON SMEAR, COUNT APPEARS ADEQUATE 531* 452*  --    BMET Recent Labs    01/26/18 2211 01/27/18 1359 01/28/18 0311  NA 130* 136 138  K 5.4* 4.4 4.5  CL 94* 105 111  CO2 22 21* 22  GLUCOSE 129* 104* 109*  BUN 35* 29* 31*  CREATININE 0.93 0.67 0.64  CALCIUM 8.0* 7.5* 7.2*   LFT Recent Labs    01/26/18 2211  PROT 5.5*  ALBUMIN 2.4*  AST 25  ALT 21  ALKPHOS 145*  BILITOT 1.5*   Studies/Results: Dg Lumbar Spine 2-3 Views  Result Date:  01/27/2018 CLINICAL DATA:  Pain across lower back, no injury EXAM: LUMBAR SPINE - 2-3 VIEW COMPARISON:  Earlier CT abdomen and pelvis of 01/27/2018 FINDINGS: Osseous demineralization. Five non-rib-bearing lumbar vertebra. Hypoplastic last ribs. Vertebral body heights maintained without fracture or bone destruction. Minimal anterolisthesis L4-L5 likely due to facet degenerative changes, which are present throughout the lower lumbar spine. SI joints preserved. Excreted contrast material within urinary bladder. Atherosclerotic calcifications aorta with fusiform aneurysmal dilatation of the mid to distal abdominal aorta measuring 4.0 cm greatest AP diameter on lateral view, though this contains inherent radiographic magnification; the preceding CT abdomen and pelvis demonstrated maximal AP diameter of 3.4 cm at the same level. 7 mm nonobstructing inferior pole RIGHT renal calculus. IMPRESSION: Osseous demineralization with mild scattered degenerative facet disease changes of the lumbar spine. No acute lumbar spine abnormalities. Abdominal aortic aneurysm, please refer to preceding CT for accurate assessment Nonobstructing 7 mm RIGHT renal calculus. Electronically Signed   By: Ulyses Southward M.D.   On: 01/27/2018 14:56   Dg Chest Port 1 View  Result Date: 01/28/2018 CLINICAL DATA:  Acute respiratory distress EXAM: PORTABLE CHEST 1 VIEW COMPARISON:  01/27/2018 FINDINGS: The heart remains moderately enlarged. Vascular congestion. Mild interstitial edema. Small pleural effusions are suspected. IMPRESSION: CHF with mild pulmonary edema and suspected small pleural effusions. Electronically Signed   By: Jolaine Click M.D.   On: 01/28/2018 12:57  Assessment / Plan:   Assessment: 1.  Anemia: Hemoglobin 9.7--> 8.9--> 7.1--> 2 units PRBCs--> 12.5, no further signs of overt bleeding 2.  Abnormal CT of the esophagus: Showing esophageal thickening, question esophagitis, patient is now on a PPI 3.  SIRS   4.  Elevated  troponin: Increased overnight from 0.04 up to 4.11 with last result at 3.5.8 this morning  Plan: 1.  At this time given patient's elevated troponin levels, will allow patient to become stable from a cardiac standpoint before pursuing any further endoscopic evaluation.  Did discuss this with the patient and her family today. 2.  Would recommend continued observation of hemoglobin with transfusion as needed less than 7.  If patient has overt signs of GI bleed please let us know. 3.  At this time we will sign off.  If patient is cleared from a cardiac standpoint and still in the hospital in needs of endoscopy please call our service again.  Otherwise we can follow with her in our outpatient clinic. 4.  Please await any final recommendations from Dr. Meridee Score  Thank you for your kind consultation.   LOS: 2 days   Unk Lightning  01/29/2018, 10:28 AM

## 2018-01-29 NOTE — Progress Notes (Signed)
Rt took pt off BIPAP and placed on RA. No distress noted.

## 2018-01-29 NOTE — Progress Notes (Addendum)
  Speech Language Pathology Treatment: Dysphagia  Patient Details Name: Krystal Patrick MRN: [id number] DOB: 1920/08/07 Today's Date: 01/29/2018 Time: 4098-1191 SLP Time Calculation (min) (ACUTE ONLY): 20 min  Assessment / Plan / Recommendation Clinical Impression  PT today is much improved compared to yesterday.  She is on room air and denies increased work of breathing.  PO trials of ice chips, thin, nectar, puree initiated.  Swallow appeared marginally delayed with pt producing consistent throat clearing post=swallow - most notably with thin liquids.  After 3 ounce water test, pt demonstrated immediate throat clearing- concerning for possible laryngeal penetration.    Pt admits to some issues with swallowing thin liquids prior to admission - causing her to cough.  Chronic throat clearing reported x2 weeks also.      Pt will benefit from instrumental swallow evaluation due to her dysphagia x2 weeks - in the interim recommend nectar liquids only and po medications with puree.  Spoke to MD who agreed with plan.  Orders written.  Thanks for allowing me to help care for this pt.    Pt denies any esophageal deficits - although per imaging report - pt has esophagitis.     HPI HPI: Patient is a 84 yr old female admit with increased cough/congestion from [facility].  Recent admit 01/08/2018 with diagnosis of near syncopal episode and fall at home 5 days prior to admission.  PMH: AAA, HTN, CAD, GERD, depression.  Swallow evaluation ordered.  Family present and reports pt had increased cough over the last few weeks and reported problems swallowing stating it was difficult to get food down and effortful swallow with poor intake.  CXR negative yesterday.  Imaging study showed esophagitis with rec for endoscopy after acute esoph issues resolve.        SLP Plan  Continue with current plan of care       Recommendations  Diet recommendations: Nectar-thick liquid(medicine with puree- whole) Liquids provided  via: Cup;Straw Medication Administration: Whole meds with liquid Supervision: Patient able to self feed Compensations: Slow rate;Small sips/bites;Follow solids with liquid Postural Changes and/or Swallow Maneuvers: Seated upright 90 degrees;Upright 30-60 min after meal, Assure pt swallows before giving more, Follow solids with liquids                 Oral Care Recommendations: Oral care QID Follow up Recommendations: None SLP Visit Diagnosis: Dysphagia, oropharyngeal phase (R13.12) Plan: Continue with current plan of care       GO                Chales Abrahams 01/29/2018, 2:51 PM   Donavan Burnet, MS [facility] SLP Acute Rehab Services Pager [phone] Office [phone]

## 2018-01-29 NOTE — Progress Notes (Signed)
CRITICAL VALUE ALERT  Critical Value: Troponin 3.08  Date & Time Notied:  01/29/2018 0125  Provider Notified: Dr.Schorr and Cardiology paged   Orders Received/Actions taken: Awaiting orders

## 2018-01-29 NOTE — Progress Notes (Addendum)
Progress Note  Patient Name: Krystal Patrick Date of Encounter: 01/29/2018  Primary Cardiologist: Rollene Rotunda, MD   Subjective   Pt states she is breathing better, wants to get out of bed.  Inpatient Medications    Scheduled Meds: . sodium chloride   Intravenous Once  . capsaicin  1 application Topical Daily  . feeding supplement (ENSURE ENLIVE)  237 mL Oral BID BM  . furosemide  20 mg Intravenous Q12H  . gabapentin  300 mg Oral Daily  . pantoprazole  40 mg Intravenous Q12H  . sodium chloride flush  10-40 mL Intracatheter Q12H  . sodium chloride flush  3 mL Intravenous Q12H   Continuous Infusions: . diltiazem (CARDIZEM) infusion Stopped (01/28/18 1704)   PRN Meds: acetaminophen **OR** acetaminophen, albuterol, Melatonin, metoprolol tartrate, ondansetron **OR** ondansetron (ZOFRAN) IV, sodium chloride flush, traMADol   Vital Signs    Vitals:   01/29/18 0311 01/29/18 0343 01/29/18 0400 01/29/18 0803  BP: (!) 150/53  (!) 142/42   Pulse: 90  83   Resp: 20  19   Temp:  (!) 97.5 F (36.4 C)  (!) 97.3 F (36.3 C)  TempSrc:  Axillary  Oral  SpO2: 100%  100%   Weight:      Height:        Intake/Output Summary (Last 24 hours) at 01/29/2018 1113 Last data filed at 01/29/2018 4098 Gross per 24 hour  Intake 385.67 ml  Output 1050 ml  Net -664.33 ml   Filed Weights   01/26/18 2126  Weight: 57 kg    Telemetry    Afib, NSR - Personally Reviewed  ECG    Sinus today - Personally Reviewed  Physical Exam   GEN: elderly female on room air   Neck: No JVD Cardiac: irregular rhythm, tachycardic rate  Respiratory: on room air, diminished in bases GI: Soft, nontender, non-distended  MS: No edema; No deformity. Neuro:  Nonfocal  Psych: Normal affect   Labs    Chemistry Recent Labs  Lab 01/26/18 2211 01/27/18 1359 01/28/18 0311  NA 130* 136 138  K 5.4* 4.4 4.5  CL 94* 105 111  CO2 22 21* 22  GLUCOSE 129* 104* 109*  BUN 35* 29* 31*  CREATININE 0.93  0.67 0.64  CALCIUM 8.0* 7.5* 7.2*  PROT 5.5*  --   --   ALBUMIN 2.4*  --   --   AST 25  --   --   ALT 21  --   --   ALKPHOS 145*  --   --   BILITOT 1.5*  --   --   GFRNONAA 51* >60 >60  GFRAA 59* >60 >60  ANIONGAP 14 10 5      Hematology Recent Labs  Lab 01/26/18 2211 01/27/18 1359 01/28/18 0311 01/29/18 0003  WBC 29.8* 32.0* 23.2*  --   RBC 3.23* 2.92* 2.38*  --   HGB 9.7* 8.9* 7.1* 12.5  HCT 29.4* 26.7* 22.1* 37.5  MCV 91.0 91.4 92.9  --   MCH 30.0 30.5 29.8  --   MCHC 33.0 33.3 32.1  --   RDW 14.1 14.2 14.5  --   PLT PLATELET CLUMPS NOTED ON SMEAR, COUNT APPEARS ADEQUATE 531* 452*  --     Cardiac Enzymes Recent Labs  Lab 01/29/18 0003 01/29/18 0334 01/29/18 0812  TROPONINI 3.08* 4.11* 3.51*    Recent Labs  Lab 01/27/18 0057  TROPIPOC 0.06     BNPNo results for input(s): BNP, PROBNP in the last 168 hours.  DDimer No results for input(s): DDIMER in the last 168 hours.   Radiology    Dg Lumbar Spine 2-3 Views  Result Date: 01/27/2018 CLINICAL DATA:  Pain across lower back, no injury EXAM: LUMBAR SPINE - 2-3 VIEW COMPARISON:  Earlier CT abdomen and pelvis of 01/27/2018 FINDINGS: Osseous demineralization. Five non-rib-bearing lumbar vertebra. Hypoplastic last ribs. Vertebral body heights maintained without fracture or bone destruction. Minimal anterolisthesis L4-L5 likely due to facet degenerative changes, which are present throughout the lower lumbar spine. SI joints preserved. Excreted contrast material within urinary bladder. Atherosclerotic calcifications aorta with fusiform aneurysmal dilatation of the mid to distal abdominal aorta measuring 4.0 cm greatest AP diameter on lateral view, though this contains inherent radiographic magnification; the preceding CT abdomen and pelvis demonstrated maximal AP diameter of 3.4 cm at the same level. 7 mm nonobstructing inferior pole RIGHT renal calculus. IMPRESSION: Osseous demineralization with mild scattered  degenerative facet disease changes of the lumbar spine. No acute lumbar spine abnormalities. Abdominal aortic aneurysm, please refer to preceding CT for accurate assessment Nonobstructing 7 mm RIGHT renal calculus. Electronically Signed   By: Ulyses Southward M.D.   On: 01/27/2018 14:56   Dg Chest Port 1 View  Result Date: 01/28/2018 CLINICAL DATA:  Acute respiratory distress EXAM: PORTABLE CHEST 1 VIEW COMPARISON:  01/27/2018 FINDINGS: The heart remains moderately enlarged. Vascular congestion. Mild interstitial edema. Small pleural effusions are suspected. IMPRESSION: CHF with mild pulmonary edema and suspected small pleural effusions. Electronically Signed   By: Jolaine Click M.D.   On: 01/28/2018 12:57    Cardiac Studies   ECHO:  01/09/18  Study Conclusions - Left ventricle: The cavity size was normal. Wall thickness was normal. Systolic function was vigorous. The estimated ejection fraction was in the range of 65% to 70%. Wall motion was normal; there were no regional wall motion abnormalities. Doppler parameters are consistent with abnormal left ventricular relaxation (grade 1 diastolic dysfunction). - Aortic valve: There was no stenosis. - Mitral valve: Moderately calcified annulus. There was trivial regurgitation. - Right ventricle: The cavity size was normal. Systolic function was normal. - Tricuspid valve: Peak RV-RA gradient (S): 60 mm Hg. - Pulmonary arteries: PA peak pressure: 63 mm Hg (S). - Inferior vena cava: The vessel was normal in size. The respirophasic diameter changes were in the normal range (= 50%), consistent with normal central venous pressure.  Impressions:  - Normal LV size with EF 65-70%. Normal RV size and systolic function. No significant valvular abnormalities. Moderate pulmonary hypertension.  Patient Profile     84 y.o. female   Assessment & Plan    1. Acute respiratory failure, pulmonary hypertension - echo with LVEF  65-70% and grade 1 DD and elevated peak PA pressures - off of BiPAP and less agitated today - on room air - diuresing on 20 mg IV lasix BID - overall net positive 4L with 1 L urine output yesterday - sCr 0.64 with K 4.5   2. Atrial fibrillation - telemetry with NSR and Afib - no anticoagulation - she is tolerating rate and rhythm   3. Anemia - received 2 U PRBC - Hb now 12.5 - GI consulted for dysphasia        For questions or updates, please contact [facility] Please consult [email or web address] for contact info under        Signed, Marcelino Duster, PA  01/29/2018, 11:13 AM     History and all data above reviewed.  Patient examined.  I agree with  the findings as above.  She looks much better today than yesterday.  No SOB.  She is complaining about buttocks pain.     The patient exam reveals COR:RRR  ,  Lungs: Clear  ,  Abd: Positive bowel sounds, no rebound no guarding, Ext No edema  .  All available labs, radiology testing, previous records reviewed. Agree with documented assessment and plan. Atrial fib:  Now NSR.  No change in therapy.   I think that she can continue the IV diuresis today and probably change to PO in the AM.   Fayrene Fearing Emaan Gary  1:44 PM  01/29/2018

## 2018-01-29 NOTE — Progress Notes (Signed)
PROGRESS NOTE    Krystal Patrick  ZOX:[id number] DOB: January 23, 1921 DOA: 01/26/2018 PCP: Kaleen Mask, MD    Brief Narrative:   Krystal Patrick is a 84 y.o. female with medical history significant of HTN, CAD, AAA, and depression; who presents with altered mental status.   Patient just recently been hospitalized from 9/5-9/9, after having a fall with complaints of low back pain and sciatica.  Patient had been sent to [facility] where she was nearing discharge. On admission, pt was afebrile, tachycardic, hypotensive, elevated wbc count,  UA, and CXR negative. CT abd and pelvis showing 3.5 infra renal AAA aneurysm and esophagitis.  Her stool for occult blood was positive and her hemoglobin dropped from 9 to 7.1 this admission. She was transferred to step down foro acute respiratory failure with hypoxia sec to acute diastolic heart failure.    Assessment & Plan:   Principal Problem:   SIRS (systemic inflammatory response syndrome) (HCC) Active Problems:   AAA (abdominal aortic aneurysm) without rupture (HCC)   GERD (gastroesophageal reflux disease)   Acute metabolic encephalopathy   Hypotension   Acute blood loss anemia    Acute respiratory failure with hypoxia: Suspect secondary to fluid overload, mild acute on chronic diastolic heart failure.  She received about 4.8 liters of fluid since admission.  Transferred the patient to stepdown for possible BIPAP and closer monitoring. CXR showed CHF, with vascular congestion.  Strict intake and output. Started her on lasix, diuresed about 1 lit since yesterday.  Monitor creatinine and potassium on IV lasix.    Atrial fibrillation with RVR:  Possibly from CHF and demand ischemia from acute anemia. Rate controlled this am.  Recommend changing to oral metoprolol this am if cleared by speech to take oral.  No need for cardizem gtt.  Elevated troponins possibly from demand ischemia from CHF. Will wait for cardiology  recommendations.    GI bleed/ acute anemia of blood loss:  Patient's baseline hemoglobin around 14, dropped to 7.1 .  Stool for occult blood is positive. CT abd and pelvis showed esophagitis and Possible diverticula of the medial wall of the second portion of the duodenum and Extensive sigmoid diverticulosis. 2 units of prbc transfusion ordered. Post transfusion lasix IV 20 mg  given.  GI consulted from [facility] for possible endoscopy.  IV PPI BID ordered.  Continue with PPI, change to oral when able to take oral.  Repeat H&H around 12.  GI recommends outpatient follow up.     Acute metabolic encephalopathy: suspect from the gabapentin that was recently started at [facility] for sciatica.  Initial CT head negative.  - decreased the frequency TID to daily dose of gabapentin.  - pain control with physical therapy.    Nutrition:  Pt currently NPO . Dietary consulted. SLP consulted.   SIRS on admission:  She was empirically started on IV antibiotics,but no source of infection found. Discontinued  abx and monitor.    Leukocytosis:  Suspect reactive.     DVT prophylaxis:scd's Code Status: Full code.  Family Communication:  Daughter at bedside Disposition Plan: pending clinical improvement. Possible transfer to telemetry later today.    Consultants:   Gastroenterology Corinda Gubler.   Cardiology.   Procedures: None   Antimicrobials: None.   Subjective: Alert and answering questions appropriately.  No chest pain or sob. No nausea or vomiting.   Objective: Vitals:   01/29/18 0311 01/29/18 0343 01/29/18 0400 01/29/18 0803  BP: (!) 150/53  (!) 142/42   Pulse: 90  83  Resp: 20  19   Temp:  (!) 97.5 F (36.4 C)  (!) 97.3 F (36.3 C)  TempSrc:  Axillary  Oral  SpO2: 100%  100%   Weight:      Height:        Intake/Output Summary (Last 24 hours) at 01/29/2018 1050 Last data filed at 01/29/2018 1610 Gross per 24 hour  Intake 385.67 ml  Output 1050 ml  Net -664.33 ml   Filed  Weights   01/26/18 2126  Weight: 57 kg    Examination:  General exam: alert and comfortable on RA.  Respiratory system: Basilar rales, diminished at bases. No wheezing or rhonchi.  Cardiovascular system: S1 & S2 heard, irregular,no JVD or murmer.  Gastrointestinal system: Abdomen is nondistended, soft and nontender. Bowel sound heard.  Central nervous system: alert and answer questions appropriately.  Extremities: trace edema on the upper extremities.  Skin: No rashes, lesions or ulcers Psychiatry: calm and comfortable.     Data Reviewed: I have personally reviewed following labs and imaging studies  CBC: Recent Labs  Lab 01/26/18 2211 01/27/18 1359 01/28/18 0311 01/29/18 0003  WBC 29.8* 32.0* 23.2*  --   NEUTROABS 26.8* 29.3*  --   --   HGB 9.7* 8.9* 7.1* 12.5  HCT 29.4* 26.7* 22.1* 37.5  MCV 91.0 91.4 92.9  --   PLT PLATELET CLUMPS NOTED ON SMEAR, COUNT APPEARS ADEQUATE 531* 452*  --    Basic Metabolic Panel: Recent Labs  Lab 01/26/18 2211 01/27/18 1359 01/28/18 0311  NA 130* 136 138  K 5.4* 4.4 4.5  CL 94* 105 111  CO2 22 21* 22  GLUCOSE 129* 104* 109*  BUN 35* 29* 31*  CREATININE 0.93 0.67 0.64  CALCIUM 8.0* 7.5* 7.2*   GFR: Estimated Creatinine Clearance: 34 mL/min (by C-G formula based on SCr of 0.64 mg/dL). Liver Function Tests: Recent Labs  Lab 01/26/18 2211  AST 25  ALT 21  ALKPHOS 145*  BILITOT 1.5*  PROT 5.5*  ALBUMIN 2.4*   No results for input(s): LIPASE, AMYLASE in the last 168 hours. No results for input(s): AMMONIA in the last 168 hours. Coagulation Profile: No results for input(s): INR, PROTIME in the last 168 hours. Cardiac Enzymes: Recent Labs  Lab 01/29/18 0003 01/29/18 0334 01/29/18 0812  TROPONINI 3.08* 4.11* 3.51*   BNP (last 3 results) No results for input(s): PROBNP in the last 8760 hours. HbA1C: No results for input(s): HGBA1C in the last 72 hours. CBG: No results for input(s): GLUCAP in the last 168  hours. Lipid Profile: No results for input(s): CHOL, HDL, LDLCALC, TRIG, CHOLHDL, LDLDIRECT in the last 72 hours. Thyroid Function Tests: No results for input(s): TSH, T4TOTAL, FREET4, T3FREE, THYROIDAB in the last 72 hours. Anemia Panel: No results for input(s): VITAMINB12, FOLATE, FERRITIN, TIBC, IRON, RETICCTPCT in the last 72 hours. Sepsis Labs: Recent Labs  Lab 01/26/18 2233 01/27/18 1359 01/29/18 0003 01/29/18 0334  PROCALCITON  --  0.17  --   --   LATICACIDVEN 1.70  --  2.4* 1.3    Recent Results (from the past 240 hour(s))  Blood Culture (routine x 2)     Status: None (Preliminary result)   Collection Time: 01/27/18 12:44 AM  Result Value Ref Range Status   Specimen Description   Final    BLOOD RIGHT UPPER ARM Performed at [facility], [address] [place], [place] [address]    Special Requests   Final    BOTTLES DRAWN AEROBIC  AND ANAEROBIC Blood Culture adequate volume Performed at [facility], [address], [place], [place] [address]    Culture   Final    NO GROWTH 2 DAYS Performed at [facility], [address], [place], [place] [address]    Report Status PENDING  Incomplete  MRSA PCR Screening     Status: None   Collection Time: 01/27/18 11:07 AM  Result Value Ref Range Status   MRSA by PCR NEGATIVE NEGATIVE Final    Comment:        The GeneXpert MRSA Assay (FDA approved for NASAL specimens only), is one component of a comprehensive MRSA colonization surveillance program. It is not intended to diagnose MRSA infection nor to guide or monitor treatment for MRSA infections. Performed at [facility], [address], [place], [place] [address]   Culture, Urine     Status: None   Collection Time: 01/27/18 11:26 AM  Result Value Ref Range Status   Specimen Description   Final    Urine Performed at [facility], [address] [place], [place] [address]    Special Requests    Final    NONE Performed at [facility], [address] [place], [place] [address]    Culture   Final    NO GROWTH Performed at [facility], [address] [place], [place] [address]    Report Status 01/28/2018 FINAL  Final  Blood Culture (routine x 2)     Status: None (Preliminary result)   Collection Time: 01/27/18  2:14 PM  Result Value Ref Range Status   Specimen Description   Final    BLOOD LEFT HAND Performed at [facility], [address], [place], [place] [address]    Special Requests   Final    BOTTLES DRAWN AEROBIC ONLY Blood Culture adequate volume Performed at [facility], [address], [place], [place] [address]    Culture   Final    NO GROWTH 2 DAYS Performed at [facility], [address] [place], [place] [address]    Report Status PENDING  Incomplete         Radiology Studies: Dg Lumbar Spine 2-3 Views  Result Date: 01/27/2018 CLINICAL DATA:  Pain across lower back, no injury EXAM: LUMBAR SPINE - 2-3 VIEW COMPARISON:  Earlier CT abdomen and pelvis of 01/27/2018 FINDINGS: Osseous demineralization. Five non-rib-bearing lumbar vertebra. Hypoplastic last ribs. Vertebral body heights maintained without fracture or bone destruction. Minimal anterolisthesis L4-L5 likely due to facet degenerative changes, which are present throughout the lower lumbar spine. SI joints preserved. Excreted contrast material within urinary bladder. Atherosclerotic calcifications aorta with fusiform aneurysmal dilatation of the mid to distal abdominal aorta measuring 4.0 cm greatest AP diameter on lateral view, though this contains inherent radiographic magnification; the preceding CT abdomen and pelvis demonstrated maximal AP diameter of 3.4 cm at the same level. 7 mm nonobstructing inferior pole RIGHT renal calculus. IMPRESSION: Osseous demineralization with mild scattered degenerative facet disease changes of the  lumbar spine. No acute lumbar spine abnormalities. Abdominal aortic aneurysm, please refer to preceding CT for accurate assessment Nonobstructing 7 mm RIGHT renal calculus. Electronically Signed   By: Ulyses Southward M.D.   On: 01/27/2018 14:56   Dg Chest Port 1 View  Result Date: 01/28/2018 CLINICAL DATA:  Acute respiratory distress EXAM: PORTABLE CHEST 1 VIEW COMPARISON:  01/27/2018 FINDINGS: The heart remains moderately enlarged. Vascular congestion.  Mild interstitial edema. Small pleural effusions are suspected. IMPRESSION: CHF with mild pulmonary edema and suspected small pleural effusions. Electronically Signed   By: Jolaine Click M.D.   On: 01/28/2018 12:57        Scheduled Meds: . sodium chloride   Intravenous Once  . capsaicin  1 application Topical Daily  . feeding supplement (ENSURE ENLIVE)  237 mL Oral BID BM  . furosemide  20 mg Intravenous Q12H  . gabapentin  300 mg Oral Daily  . pantoprazole  40 mg Intravenous Q12H  . sodium chloride flush  10-40 mL Intracatheter Q12H  . sodium chloride flush  3 mL Intravenous Q12H   Continuous Infusions: . diltiazem (CARDIZEM) infusion Stopped (01/28/18 1704)     LOS: 2 days    Time spent: 35 minutes.    Kathlen Mody, MD [facility] Pager [phone]  If 7PM-7AM, please contact night-coverage [email or web address] Password [email or web address] 01/29/2018, 10:50 AM

## 2018-01-30 ENCOUNTER — Other Ambulatory Visit: Payer: Medicare Other

## 2018-01-30 ENCOUNTER — Inpatient Hospital Stay (HOSPITAL_COMMUNITY): Payer: Medicare Other

## 2018-01-30 LAB — BASIC METABOLIC PANEL
Anion gap: 10 (ref 5–15)
BUN: 20 mg/dL (ref 8–23)
CHLORIDE: 110 mmol/L (ref 98–111)
CO2: 24 mmol/L (ref 22–32)
CREATININE: 0.71 mg/dL (ref 0.44–1.00)
Calcium: 8.6 mg/dL — ABNORMAL LOW (ref 8.9–10.3)
GFR calc non Af Amer: >60 mL/min (ref >60)
Glucose, Bld: 93 mg/dL (ref 70–99)
Potassium: 3.8 mmol/L (ref 3.5–5.1)
Sodium: 144 mmol/L (ref 135–145)

## 2018-01-30 MED ORDER — POTASSIUM CHLORIDE CRYS ER 20 MEQ PO TBCR
40.0000 meq | EXTENDED_RELEASE_TABLET | Freq: Every day | ORAL | Status: DC
  Administered 2018-01-30 – 2018-02-06 (×6): 40 meq via ORAL
  Filled 2018-01-30 (×7): qty 2

## 2018-01-30 MED ORDER — MAGNESIUM HYDROXIDE 400 MG/5ML PO SUSP
30.0000 mL | Freq: Every day | ORAL | Status: DC | PRN
  Administered 2018-01-30 – 2018-02-05 (×2): 30 mL via ORAL
  Filled 2018-01-30 (×2): qty 30

## 2018-01-30 NOTE — Progress Notes (Signed)
  Speech Language Pathology Treatment: Dysphagia  Patient Details Name: Krystal Patrick MRN: [id number] DOB: 1920-05-19 Today's Date: 01/30/2018 Time: 4098-1191 SLP Time Calculation (min) (ACUTE ONLY): 32 min  Assessment / Plan / Recommendation Clinical Impression  SLP visit to educate pt and daughter to findings of MBS and reinforce effective compensation strategies.  Happy Birthday to this sweet pt - 84 years old today!!    Reviewed MBS with daughter using video loops from actual test.  Educated her and pt using teach back and demonstration to swallow precautions.  Advised daughter to suspicion for chronicity of dysphagia with exacerbation due to current illness.  Precarious situation with tablet reviewed - indicating need for medications to be crushed.  Pt observed with water via straw and medicine crushed requiring total cues for chin tuck posture.  No indication of aspiration with implementation of swallow precautions.    Will follow up with this pt,  Given chronic dysphagia, readmission and multiple medical issues, advanced age & ongoing aspiration risk, would recommend to address pt's goals of care.      HPI HPI: Patient is a 84 yr old female admit with increased cough/congestion from SNF.  Recent admit 01/08/2018 with diagnosis of near syncopal episode and fall at home 5 days prior to admission.  PMH: AAA, HTN, CAD, GERD, depression.  Swallow evaluation ordered.  Family present and reports pt had increased cough over the last few weeks and reported problems swallowing stating it was difficult to get food down and effortful swallow with poor intake.  CXR negative yesterday.  Imaging study showed esophagitis with rec for endoscopy after acute esoph issues resolve.  Pt with respiratory issues during hospital admit and thus required transfer to ICU and Bipap usage - ? fluid overload?  MBS completed today and purpose of visit was to educated daughter and pt and reinforce effective compensation  strategies.        SLP Plan  Continue with current plan of care       Recommendations  Diet recommendations: Dysphagia 3 (mechanical soft);Thin liquid Liquids provided via: Straw Medication Administration: Crushed with puree Supervision: Staff to assist with self feeding;Full supervision/cueing for compensatory strategies Compensations: Slow rate;Small sips/bites;Follow solids with liquid;Other (Comment)(dry swallows with liquids, throat clearing and reswallow) Postural Changes and/or Swallow Maneuvers: Seated upright 90 degrees;Upright 30-60 min after meal                Oral Care Recommendations: Oral care QID Follow up Recommendations: Skilled Nursing facility SLP Visit Diagnosis: Dysphagia, pharyngoesophageal phase (R13.14) Plan: Continue with current plan of care       GO               Donavan Burnet, MS [facility] SLP Acute Rehab Services Pager [phone] Office [phone]  Chales Abrahams 01/30/2018, 10:37 AM

## 2018-01-30 NOTE — Progress Notes (Addendum)
Progress Note  Patient Name: Krystal Patrick Date of Encounter: 01/30/2018  Primary Cardiologist: Rollene Rotunda, MD   Subjective   Feels she is breathing a little better, but still SOB taking pills.   Dtr in room, wants to know if cards has cleared her for EGD  Inpatient Medications    Scheduled Meds: . sodium chloride   Intravenous Once  . capsaicin  1 application Topical Daily  . feeding supplement (ENSURE ENLIVE)  237 mL Oral BID BM  . furosemide  20 mg Intravenous Q12H  . gabapentin  300 mg Oral Daily  . metoprolol tartrate  25 mg Oral BID  . pantoprazole  40 mg Intravenous Q12H  . sodium chloride flush  10-40 mL Intracatheter Q12H  . sodium chloride flush  3 mL Intravenous Q12H   Continuous Infusions: . diltiazem (CARDIZEM) infusion Stopped (01/28/18 1704)   PRN Meds: acetaminophen **OR** acetaminophen, albuterol, Melatonin, metoprolol tartrate, ondansetron **OR** ondansetron (ZOFRAN) IV, RESOURCE THICKENUP CLEAR, sodium chloride flush, traMADol   Vital Signs    Vitals:   01/29/18 1900 01/29/18 2000 01/29/18 2049 01/30/18 0508  BP: (!) 138/52 (!) 150/66 122/77 125/71  Pulse: 85 91 87 82  Resp: (!) 26 (!) 23 16 (!) 21  Temp:  98.3 F (36.8 C) 98.1 F (36.7 C) 98.6 F (37 C)  TempSrc:  Oral Oral Oral  SpO2: 97% 95% 93% 93%  Weight:      Height:        Intake/Output Summary (Last 24 hours) at 01/30/2018 0932 Last data filed at 01/29/2018 2015 Gross per 24 hour  Intake -  Output 950 ml  Net -950 ml   Filed Weights   01/26/18 2126  Weight: 57 kg    Telemetry    SR, brief Vent bigeminy, brief PAF (a few seconds), sinus brady w/ 2.92 sec pause due to dropped beat - Personally Reviewed  ECG    09/26, SR, HR 83 - Personally Reviewed  Physical Exam   General: Well developed, frail, elderly, female in no acute distress HEENT: normal for age  Normocephalic and atraumatic  Lungs: bibasilar rales Heart: HRRR S1 S2, without RG. Soft SEM. Pulses are  2+ & equal. No carotid bruit. Mild JVD but +HJR. Abdomen: Bowel sounds are present, abdomen soft and non-tender without masses or  hernias noted. Msk: weak strength and tone for age. Extremities: No clubbing, cyanosis or edema.    Skin:  No rashes or lesions noted. Neuro: Alert and oriented X 3. Psych:  Good affect, responds appropriately   Labs    Chemistry Recent Labs  Lab 01/26/18 2211  01/28/18 0311 01/29/18 1154 01/30/18 0647  NA 130*   < > 138 143 144  K 5.4*   < > 4.5 3.7 3.8  CL 94*   < > 111 112* 110  CO2 22   < > 22 20* 24  GLUCOSE 129*   < > 109* 74 93  BUN 35*   < > 31* 23 20  CREATININE 0.93   < > 0.64 0.81 0.71  CALCIUM 8.0*   < > 7.2* 8.0* 8.6*  PROT 5.5*  --   --   --   --   ALBUMIN 2.4*  --   --   --   --   AST 25  --   --   --   --   ALT 21  --   --   --   --   ALKPHOS 145*  --   --   --   --  BILITOT 1.5*  --   --   --   --   GFRNONAA 51*   < > >60 >60 >60  GFRAA 59*   < > >60 >60 >60  ANIONGAP 14   < > 5 11 10    < > = values in this interval not displayed.     Hematology Recent Labs  Lab 01/27/18 1359 01/28/18 0311 01/29/18 0003 01/29/18 1154  WBC 32.0* 23.2*  --  17.7*  RBC 2.92* 2.38*  --  4.06  HGB 8.9* 7.1* 12.5 11.8*  HCT 26.7* 22.1* 37.5 36.2  MCV 91.4 92.9  --  89.2  MCH 30.5 29.8  --  29.1  MCHC 33.3 32.1  --  32.6  RDW 14.2 14.5  --  15.4  PLT 531* 452*  --  399    Cardiac Enzymes Recent Labs  Lab 01/29/18 0003 01/29/18 0334 01/29/18 0812  TROPONINI 3.08* 4.11* 3.51*    Recent Labs  Lab 01/27/18 0057  TROPIPOC 0.06    Radiology    Dg Chest Port 1 View  Result Date: 01/28/2018 CLINICAL DATA:  Acute respiratory distress EXAM: PORTABLE CHEST 1 VIEW COMPARISON:  01/27/2018 FINDINGS: The heart remains moderately enlarged. Vascular congestion. Mild interstitial edema. Small pleural effusions are suspected. IMPRESSION: CHF with mild pulmonary edema and suspected small pleural effusions. Electronically Signed   By: Jolaine Click M.D.   On: 01/28/2018 12:57    Cardiac Studies   ECHO:  01/09/18  Study Conclusions - Left ventricle: The cavity size was normal. Wall thickness was normal. Systolic function was vigorous. The estimated ejection fraction was in the range of 65% to 70%. Wall motion was normal; there were no regional wall motion abnormalities. Doppler parameters are consistent with abnormal left ventricular relaxation (grade 1 diastolic dysfunction). - Aortic valve: There was no stenosis. - Mitral valve: Moderately calcified annulus. There was trivial regurgitation. - Right ventricle: The cavity size was normal. Systolic function was normal. - Tricuspid valve: Peak RV-RA gradient (S): 60 mm Hg. - Pulmonary arteries: PA peak pressure: 63 mm Hg (S). - Inferior vena cava: The vessel was normal in size. The respirophasic diameter changes were in the normal range (= 50%), consistent with normal central venous pressure.  Impressions:  - Normal LV size with EF 65-70%. Normal RV size and systolic function. No significant valvular abnormalities. Moderate pulmonary hypertension.  Patient Profile     84 y.o. female w/ hx AAA, GERD, reported CAD but no documentation, of this, HTN, was admitted 09/23 w/ SIRS, ?UGIB, hypotension, rapid atrial fib.   Assessment & Plan    1. Acute respiratory failure, pulmonary hypertension -Respiratory status is improved, but still with some overload, continue IV Lasix - EF 65-70% with grade 1 diastolic dysfunction, 01/09/2018 - Now off BiPAP greater than 24 hours - Intake/output -1.2 L yesterday but not sure BP will tolerate a dose increase in Lasix.  She is net +2.9 L since admission -Has not been getting weighed, institute daily weights if possible            - potassium supplement                                 2.  Persistent atrial fibrillation -Brief bradycardia, just a few seconds of atrial fib, twice overnight - No anticoagulation  despite elevated CHA2DS2-VASc score 4 (age x 2, female, HTN) because of anemia and possible bleed -Continue  beta-blocker  3. Anemia -Transfused and improved, per IM -GI has seen and wants to do an EGD - Not currently stable enough to be sedated, possibly by Monday, daughter updated    For questions or updates, please contact [facility] Please consult [email or web address] for contact info under     Signed, Theodore Demark, PA-C  01/30/2018, 9:32 AM    History and all data above reviewed.  Patient examined.  I agree with the findings as above. No chest pain.  No acute SOB.   The patient exam reveals COR:RRR  ,  Lungs: Few basilar crackles  ,  Abd: Positive bowel sounds, no rebound no guarding, Ext No edema  .  All available labs, radiology testing, previous records reviewed. Agree with documented assessment and plan. Agree with continued IV Lasix.  Possibly PO in the AM.   Fayrene Fearing Mika Griffitts  12:01 PM  01/30/2018

## 2018-01-30 NOTE — Progress Notes (Signed)
Modified Barium Swallow Progress Note  Patient Details  Name: Krystal Patrick MRN: [id number] Date of Birth: 1920/09/04  Today's Date: 01/30/2018  Modified Barium Swallow completed.  Full report located under Chart Review in the Imaging Section.  Brief recommendations include the following:  Clinical Impression  Patient presents with moderately severe pharyngeal dysphagia with sensorimotor deficits and ? mechanical component due to anterior curvature of spin impacting epiglottic deflection.  Decreased laryngeal elevation/tongue base retraction results in very poor epiglottic deflection and thus significant pharyngeal (vallecular more than pyriform) residuals WITHOUT pt awareness.  Laryngeal penetration of thin/nectar due to decrease pharyngeal clearance noted- but does clear larynx with further swallows.  Cued dry swallows marginally effective to decrease residuals and following solids with liquids with chin tuck help to mitigate.    Barium tablet given with pudding precariously lodged at vallecular region - not clearing with thin and next pudding bolus - transited to pyriform sinus and 3rd bolus of pudding with dry swallow cleared it into esophagus.  Pt does not fully clear vallecular space and thus will be high aspiration risk.  Throat clearing and reswallow helpful but also difficult for pt to conduct.    Recommend if pt's pill are of any size to CRUSH them if not contraindicated.  Chin tuck posture decreased laryngeal penetration of minimal even with sequential straw swallows.  Thin via tsp with head neutral was not aspirated or penetrated.  Pt remains aspiration risk due to sensorimotor deficits .    Recommend dys3/ground meats/thin with strict precautions.  SLP to follow up - suspect some chronic component to dysphagia with currente exacerbation due to pt's deconditioning given pt's sensory deficit.    Swallow Evaluation Recommendations       SLP Diet Recommendations: Dysphagia 3  (Mech soft) solids;Thin liquid(ground meats only')   Liquid Administration via: Straw   Medication Administration: Crushed with puree   Supervision: Full assist for feeding;Full supervision/cueing for compensatory strategies   Compensations: Slow rate;Small sips/bites;Multiple dry swallows after each bite/sip;Effortful swallow;Chin tuck;Use straw to facilitate chin tuck(clear throat and reswallow)   Postural Changes: Remain semi-upright after after feeds/meals (Comment);Seated upright at 90 degrees   Oral Care Recommendations: Oral care BID      Donavan Burnet, MS [facility] SLP Acute Rehab Services Pager [phone] Office [phone]   Chales Abrahams 01/30/2018,9:39 AM

## 2018-01-30 NOTE — Progress Notes (Signed)
PROGRESS NOTE    Krystal Patrick  ZOX:[id number] DOB: 12/02/1920 DOA: 01/26/2018 PCP: Kaleen Mask, MD    Brief Narrative:   Krystal Patrick is a 84 y.o. female with medical history significant of HTN, CAD, AAA, and depression; who presents with altered mental status.   Patient just recently been hospitalized from 9/5-9/9, after having a fall with complaints of low back pain and sciatica.  Patient had been sent to [facility] where she was nearing discharge. On admission, pt was afebrile, tachycardic, hypotensive, elevated wbc count,  UA, and CXR negative. CT abd and pelvis showing 3.5 infra renal AAA aneurysm and esophagitis.  Her stool for occult blood was positive and her hemoglobin dropped from 9 to 7.1 this admission. She was transferred to step down foro acute respiratory failure with hypoxia sec to acute diastolic heart failure.    Assessment & Plan:   Principal Problem:   SIRS (systemic inflammatory response syndrome) (HCC) Active Problems:   AAA (abdominal aortic aneurysm) without rupture (HCC)   GERD (gastroesophageal reflux disease)   Acute metabolic encephalopathy   Hypotension   Acute blood loss anemia    Acute respiratory failure with hypoxia: improved.  Suspect secondary to fluid overload, mild acute on chronic diastolic heart failure.  She received about 4.8 liters of fluid since admission.  Transferred the patient to stepdown for possible BIPAP and closer monitoring. CXR showed CHF, with vascular congestion.   Started her on lasix, diuresed about 1.2 lit over the last 24 hours, monitor on IV lasix. Cardiology consulted , appreciate recommendations.  Monitor creatinine and potassium on IV lasix.    Atrial fibrillation with RVR:  Possibly from CHF and demand ischemia from acute anemia. Rate controlled this am.  Recommend changing to oral metoprolol this am if cleared by speech to take oral.  No need for cardizem gtt.  Elevated troponins  possibly from demand ischemia from CHF.  GI bleed/ acute anemia of blood loss:  Patient's baseline hemoglobin around 14, dropped to 7.1 .  Stool for occult blood is positive. CT abd and pelvis showed esophagitis and Possible diverticula of the medial wall of the second portion of the duodenum and Extensive sigmoid diverticulosis. 2 units of prbc transfusion ordered. Post transfusion lasix IV 20 mg  given.  GI consulted from [facility] for possible endoscopy.  IV PPI BID ordered.  Continue with PPI, change to oral when able to take oral.  Repeat H&H around 12.  GI recommends outpatient follow up.  No obvious bleeding per rectum. Recheck cbc in am.     Acute metabolic encephalopathy: suspect from the gabapentin that was recently started at [facility] for sciatica.  Initial CT head negative.  - decreased the frequency TID to daily dose of gabapentin.  - pain control with physical therapy.  - pt appears to be back to baseline, she Is alert and answering questions appropriately.    Nutrition:  SLP evaluated, recommended dysphagia 3 diet.   SIRS on admission:  She was empirically started on IV antibiotics,but no source of infection found. Discontinued  abx and monitor.    Leukocytosis:  Suspect reactive.     DVT prophylaxis:scd's Code Status: Full code.  Family Communication:  None at bedside today.  Disposition Plan: pending clinical improvement.   Consultants:   Gastroenterology Corinda Gubler.   Cardiology.   Procedures: None   Antimicrobials: None.   Subjective: Alert and oriented, reports breathing has improved, but still feels winded when moving.  No chest pain or sob.  Objective: Vitals:   01/29/18 2049 01/30/18 0508 01/30/18 1410 01/30/18 1426  BP: 122/77 125/71 (!) 86/47 (!) 92/54  Pulse: 87 82 74   Resp: 16 (!) 21 (!) 24 20  Temp: 98.1 F (36.7 C) 98.6 F (37 C) 98.1 F (36.7 C)   TempSrc: Oral Oral Oral   SpO2: 93% 93% 93%   Weight:      Height:         Intake/Output Summary (Last 24 hours) at 01/30/2018 1520 Last data filed at 01/29/2018 2015 Gross per 24 hour  Intake -  Output 100 ml  Net -100 ml   Filed Weights   01/26/18 2126  Weight: 57 kg    Examination:  General exam: alert and comfortable on RA.  Respiratory system: air entry fair, no wheezing or rhonchi.  Cardiovascular system: S1 & S2 heard, irregular, no JVD or murmer.  Gastrointestinal system: Abdomen is soft NT ND BS+ Central nervous system: alert and answering questions appropriately.  Extremities: trace edema on the upper extremities., which is improving.  Skin: No rashes, lesions or ulcers Psychiatry: calm and comfortable.     Data Reviewed: I have personally reviewed following labs and imaging studies  CBC: Recent Labs  Lab 01/26/18 2211 01/27/18 1359 01/28/18 0311 01/29/18 0003 01/29/18 1154  WBC 29.8* 32.0* 23.2*  --  17.7*  NEUTROABS 26.8* 29.3*  --   --   --   HGB 9.7* 8.9* 7.1* 12.5 11.8*  HCT 29.4* 26.7* 22.1* 37.5 36.2  MCV 91.0 91.4 92.9  --  89.2  PLT PLATELET CLUMPS NOTED ON SMEAR, COUNT APPEARS ADEQUATE 531* 452*  --  399   Basic Metabolic Panel: Recent Labs  Lab 01/26/18 2211 01/27/18 1359 01/28/18 0311 01/29/18 1154 01/30/18 0647  NA 130* 136 138 143 144  K 5.4* 4.4 4.5 3.7 3.8  CL 94* 105 111 112* 110  CO2 22 21* 22 20* 24  GLUCOSE 129* 104* 109* 74 93  BUN 35* 29* 31* 23 20  CREATININE 0.93 0.67 0.64 0.81 0.71  CALCIUM 8.0* 7.5* 7.2* 8.0* 8.6*   GFR: Estimated Creatinine Clearance: 33.3 mL/min (by C-G formula based on SCr of 0.71 mg/dL). Liver Function Tests: Recent Labs  Lab 01/26/18 2211  AST 25  ALT 21  ALKPHOS 145*  BILITOT 1.5*  PROT 5.5*  ALBUMIN 2.4*   No results for input(s): LIPASE, AMYLASE in the last 168 hours. No results for input(s): AMMONIA in the last 168 hours. Coagulation Profile: No results for input(s): INR, PROTIME in the last 168 hours. Cardiac Enzymes: Recent Labs  Lab  01/29/18 0003 01/29/18 0334 01/29/18 0812  TROPONINI 3.08* 4.11* 3.51*   BNP (last 3 results) No results for input(s): PROBNP in the last 8760 hours. HbA1C: No results for input(s): HGBA1C in the last 72 hours. CBG: No results for input(s): GLUCAP in the last 168 hours. Lipid Profile: No results for input(s): CHOL, HDL, LDLCALC, TRIG, CHOLHDL, LDLDIRECT in the last 72 hours. Thyroid Function Tests: No results for input(s): TSH, T4TOTAL, FREET4, T3FREE, THYROIDAB in the last 72 hours. Anemia Panel: No results for input(s): VITAMINB12, FOLATE, FERRITIN, TIBC, IRON, RETICCTPCT in the last 72 hours. Sepsis Labs: Recent Labs  Lab 01/26/18 2233 01/27/18 1359 01/29/18 0003 01/29/18 0334  PROCALCITON  --  0.17  --   --   LATICACIDVEN 1.70  --  2.4* 1.3    Recent Results (from the past 240 hour(s))  Blood Culture (routine x 2)     Status:  None (Preliminary result)   Collection Time: 01/27/18 12:44 AM  Result Value Ref Range Status   Specimen Description   Final    BLOOD RIGHT UPPER ARM Performed at [facility], [address] [place], [place] [address]    Special Requests   Final    BOTTLES DRAWN AEROBIC AND ANAEROBIC Blood Culture adequate volume Performed at [facility], [address], [place], [place] [address]    Culture   Final    NO GROWTH 3 DAYS Performed at [facility], [address] [place], [place] [address]    Report Status PENDING  Incomplete  MRSA PCR Screening     Status: None   Collection Time: 01/27/18 11:07 AM  Result Value Ref Range Status   MRSA by PCR NEGATIVE NEGATIVE Final    Comment:        The GeneXpert MRSA Assay (FDA approved for NASAL specimens only), is one component of a comprehensive MRSA colonization surveillance program. It is not intended to diagnose MRSA infection nor to guide or monitor treatment for MRSA infections. Performed at [facility], [address]  [place], [place] [address]   Culture, Urine     Status: None   Collection Time: 01/27/18 11:26 AM  Result Value Ref Range Status   Specimen Description   Final    Urine Performed at [facility], [address], [place], [place] [address]    Special Requests   Final    NONE Performed at [facility], [address], [place], [place] [address]    Culture   Final    NO GROWTH Performed at [facility], [address] [place], [place] [address]    Report Status 01/28/2018 FINAL  Final  Blood Culture (routine x 2)     Status: None (Preliminary result)   Collection Time: 01/27/18  2:14 PM  Result Value Ref Range Status   Specimen Description   Final    BLOOD LEFT HAND Performed at [facility], [address] [place], [place] [address]    Special Requests   Final    BOTTLES DRAWN AEROBIC ONLY Blood Culture adequate volume Performed at [facility], [address], [place], [place] [address]    Culture   Final    NO GROWTH 3 DAYS Performed at [facility], [address] [place], [place] [address]    Report Status PENDING  Incomplete         Radiology Studies: Dg Swallowing Func-speech Pathology  Result Date: 01/30/2018 Objective Swallowing Evaluation: Type of Study: MBS-Modified Barium Swallow Study  Patient Details Name: Royal Beirne MRN: [id number] Date of Birth: 26-Oct-1920 Today's Date: 01/30/2018 Time: SLP Start Time (ACUTE ONLY): 4034 -SLP Stop Time (ACUTE ONLY): 0906 SLP Time Calculation (min) (ACUTE ONLY): 31 min Past Medical History: Past Medical History: Diagnosis Date . AAA (abdominal aortic aneurysm) (HCC)  . CAD (coronary artery disease)  . GERD (gastroesophageal reflux disease)  . Hypertension  Past Surgical History: Past Surgical History: Procedure Laterality Date . CHOLECYSTECTOMY   . SMALL BOWEL REPAIR   HPI: Patient is a 84 yr old female admit with increased cough/congestion  from SNF.  Recent admit 01/08/2018 with diagnosis of near syncopal episode and fall at home 5 days prior to admission.  PMH: AAA, HTN, CAD, GERD, depression.  Swallow evaluation ordered.  Family present and reports pt had increased cough over the  last few weeks and reported problems swallowing stating it was difficult to get food down and effortful swallow with poor intake.  CXR negative yesterday.  Imaging study showed esophagitis with rec for endoscopy after acute esoph issues resolve.  Pt with respiratory issues during hospital admit and thus required transfer to ICU and Bipap usage - ? fluid overload?  Subjective: pt awake in flouro chair Assessment / Plan / Recommendation CHL IP CLINICAL IMPRESSIONS 01/30/2018 Clinical Impression Patient presents with moderately severe pharyngeal dysphagia with sensorimotor deficits and ? mechanical component due to anterior curvature of spin impacting epiglottic deflection.  Decreased laryngeal elevation/tongue base retraction results in very poor epiglottic deflection and thus significant pharyngeal (vallecular more than pyriform) residuals WITHOUT pt awareness.  Laryngeal penetration of thin/nectar due to decrease pharyngeal clearance noted- but does clear larynx with further swallows.  Cued dry swallows marginally effective to decrease residuals and following solids with liquids with chin tuck help to mitigate.  Barium tablet given with pudding precariously lodged at vallecular region - not clearing with thin and next pudding bolus - transited to pyriform sinus and 3rd bolus of pudding with dry swallow cleared it into esophagus.  Pt does not fully clear vallecular space and thus will be high aspiration risk.  Throat clearing and reswallow helpful but also difficult for pt to conduct.  Recommend if pt's pill are of any size to CRUSH them if not contraindicated.  Chin tuck posture decreased laryngeal penetration of minimal even with sequential straw swallows.  Thin via tsp  with head neutral was not aspirated or penetrated.  Pt remains aspiration risk due to sensorimotor deficits .  Recommend dys3/ground meats/thin with strict precautions.  SLP to follow up - suspect some chronic component to dysphagia with exacerbation due to deconditioning given pt's sensory deficit.  SLP Visit Diagnosis Dysphagia, pharyngeal phase (R13.13);Dysphagia, pharyngoesophageal phase (R13.14) Attention and concentration deficit following -- Frontal lobe and executive function deficit following -- Impact on safety and function Moderate aspiration risk   CHL IP TREATMENT RECOMMENDATION 01/30/2018 Treatment Recommendations Therapy as outlined in treatment plan below   Prognosis 01/30/2018 Prognosis for Safe Diet Advancement Good Barriers to Reach Goals Time post onset;Other (Comment) Barriers/Prognosis Comment -- CHL IP DIET RECOMMENDATION 01/30/2018 SLP Diet Recommendations Dysphagia 3 (Mech soft) solids;Thin liquid Liquid Administration via Straw Medication Administration Crushed with puree Compensations Slow rate;Small sips/bites;Multiple dry swallows after each bite/sip;Effortful swallow;Chin tuck;Use straw to facilitate chin tuck Postural Changes Remain semi-upright after after feeds/meals (Comment);Seated upright at 90 degrees   CHL IP OTHER RECOMMENDATIONS 01/30/2018 Recommended Consults -- Oral Care Recommendations Oral care BID Other Recommendations --   CHL IP FOLLOW UP RECOMMENDATIONS 01/30/2018 Follow up Recommendations Skilled Nursing facility   [facility] IP FREQUENCY AND DURATION 01/28/2018 Speech Therapy Frequency (ACUTE ONLY) min 1 x/week Treatment Duration --      CHL IP ORAL PHASE 01/30/2018 Oral Phase Impaired Oral - Pudding Teaspoon -- Oral - Pudding Cup -- Oral - Honey Teaspoon -- Oral - Honey Cup -- Oral - Nectar Teaspoon Delayed oral transit;Premature spillage;Piecemeal swallowing Oral - Nectar Cup Delayed oral transit;Premature spillage;Piecemeal swallowing Oral - Nectar Straw Delayed oral  transit;Premature spillage;Piecemeal swallowing Oral - Thin Teaspoon Delayed oral transit;Premature spillage;Piecemeal swallowing Oral - Thin Cup Delayed oral transit;Premature spillage;Piecemeal swallowing Oral - Thin Straw Delayed oral transit;Premature spillage;Piecemeal swallowing Oral - Puree Delayed oral transit;Premature spillage;Piecemeal swallowing Oral - Mech Soft Delayed oral transit;Premature spillage Oral - Regular -- Oral - Multi-Consistency -- Oral - Pill Delayed oral transit;Premature  spillage Oral Phase - Comment --  CHL IP PHARYNGEAL PHASE 01/30/2018 Pharyngeal Phase Impaired Pharyngeal- Pudding Teaspoon -- Pharyngeal -- Pharyngeal- Pudding Cup -- Pharyngeal -- Pharyngeal- Honey Teaspoon -- Pharyngeal -- Pharyngeal- Honey Cup -- Pharyngeal -- Pharyngeal- Nectar Teaspoon Reduced epiglottic inversion;Reduced tongue base retraction;Pharyngeal residue - valleculae Pharyngeal -- Pharyngeal- Nectar Cup Reduced epiglottic inversion;Reduced tongue base retraction;Pharyngeal residue - valleculae Pharyngeal -- Pharyngeal- Nectar Straw Reduced epiglottic inversion;Reduced tongue base retraction;Pharyngeal residue - valleculae;Pharyngeal residue - pyriform Pharyngeal -- Pharyngeal- Thin Teaspoon Reduced epiglottic inversion;Reduced tongue base retraction;Pharyngeal residue - valleculae Pharyngeal -- Pharyngeal- Thin Cup Reduced epiglottic inversion;Reduced tongue base retraction;Penetration/Aspiration during swallow;Penetration/Apiration after swallow;Pharyngeal residue - valleculae;Pharyngeal residue - pyriform Pharyngeal Material enters airway, remains ABOVE vocal cords and not ejected out Pharyngeal- Thin Straw Reduced epiglottic inversion;Reduced tongue base retraction;Penetration/Aspiration during swallow;Penetration/Apiration after swallow;Pharyngeal residue - valleculae;Pharyngeal residue - pyriform Pharyngeal Material enters airway, remains ABOVE vocal cords and not ejected out Pharyngeal- Puree --  Pharyngeal -- Pharyngeal- Mechanical Soft -- Pharyngeal -- Pharyngeal- Regular -- Pharyngeal -- Pharyngeal- Multi-consistency -- Pharyngeal -- Pharyngeal- Pill Reduced epiglottic inversion;Reduced tongue base retraction;Pharyngeal residue - valleculae;Pharyngeal residue - pyriform Pharyngeal -- Pharyngeal Comment chin tuck posture   CHL IP CERVICAL ESOPHAGEAL PHASE 01/30/2018 Cervical Esophageal Phase Impaired Pudding Teaspoon -- Pudding Cup -- Honey Teaspoon -- Honey Cup -- Nectar Teaspoon -- Nectar Cup -- Nectar Straw -- Thin Teaspoon -- Thin Cup -- Thin Straw -- Puree -- Mechanical Soft -- Regular -- Multi-consistency -- Pill -- Cervical Esophageal Comment appearance of "wide esophagus" with barium coating esophagus, barium tablet lodged at GE, did not clear with multiple boluses of thin or pudding Chales Abrahams 01/30/2018, 9:38 AM  Donavan Burnet, MS [facility] SLP Acute Rehab Services Pager [phone] Office [phone]                  Scheduled Meds: . sodium chloride   Intravenous Once  . capsaicin  1 application Topical Daily  . feeding supplement (ENSURE ENLIVE)  237 mL Oral BID BM  . furosemide  20 mg Intravenous Q12H  . gabapentin  300 mg Oral Daily  . metoprolol tartrate  25 mg Oral BID  . pantoprazole  40 mg Intravenous Q12H  . potassium chloride  40 mEq Oral Daily  . sodium chloride flush  10-40 mL Intracatheter Q12H  . sodium chloride flush  3 mL Intravenous Q12H   Continuous Infusions: . diltiazem (CARDIZEM) infusion Stopped (01/28/18 1704)     LOS: 3 days    Time spent: 35 minutes.    Kathlen Mody, MD [facility] Pager [phone]  If 7PM-7AM, please contact night-coverage [email or web address] Password [email or web address] 01/30/2018, 3:20 PM

## 2018-01-31 DIAGNOSIS — I5043 Acute on chronic combined systolic (congestive) and diastolic (congestive) heart failure: Secondary | ICD-10-CM

## 2018-01-31 LAB — BASIC METABOLIC PANEL
Anion gap: 9 (ref 5–15)
BUN: 23 mg/dL (ref 8–23)
CALCIUM: 8.7 mg/dL — AB (ref 8.9–10.3)
CO2: 29 mmol/L (ref 22–32)
Chloride: 104 mmol/L (ref 98–111)
Creatinine, Ser: 0.77 mg/dL (ref 0.44–1.00)
Glucose, Bld: 104 mg/dL — ABNORMAL HIGH (ref 70–99)
Potassium: 4 mmol/L (ref 3.5–5.1)
SODIUM: 142 mmol/L (ref 135–145)

## 2018-01-31 LAB — CBC
HCT: 39.1 % (ref 36.0–46.0)
Hemoglobin: 13 g/dL (ref 12.0–15.0)
MCH: 29.5 pg (ref 26.0–34.0)
MCHC: 33.2 g/dL (ref 30.0–36.0)
MCV: 88.9 fL (ref 78.0–100.0)
PLATELETS: 308 10*3/uL (ref 150–400)
RBC: 4.4 MIL/uL (ref 3.87–5.11)
RDW: 15.4 % (ref 11.5–15.5)
WBC: 10.4 10*3/uL (ref 4.0–10.5)

## 2018-01-31 MED ORDER — METOPROLOL TARTRATE 12.5 MG HALF TABLET
12.5000 mg | ORAL_TABLET | Freq: Two times a day (BID) | ORAL | Status: DC
  Administered 2018-01-31 (×2): 12.5 mg via ORAL
  Filled 2018-01-31 (×2): qty 1

## 2018-01-31 NOTE — Progress Notes (Signed)
   01/30/18 1933  MEWS Score  Resp (!) 22  Pulse Rate 74  BP (!) 98/49  Temp 97.8 F (36.6 C)  MEWS RR 1  MEWS Pulse 0  MEWS Systolic 1  MEWS LOC 0  MEWS Temp 0  MEWS Score 2  MEWS Score Color Yellow  will follow protocol

## 2018-01-31 NOTE — Progress Notes (Signed)
Pt had 5 pauses w/in 20 sec, On-Call notified. Pt asymptomatic. Strip saved on tele monitor. Will continue to monitor. Justin Mend, RN

## 2018-01-31 NOTE — Progress Notes (Signed)
Progress Note   Subjective   Doing well today, the patient denies CP or SOB.  No new concerns  Inpatient Medications    Scheduled Meds: . sodium chloride   Intravenous Once  . capsaicin  1 application Topical Daily  . feeding supplement (ENSURE ENLIVE)  237 mL Oral BID BM  . furosemide  20 mg Intravenous Q12H  . gabapentin  300 mg Oral Daily  . pantoprazole  40 mg Intravenous Q12H  . potassium chloride  40 mEq Oral Daily  . sodium chloride flush  10-40 mL Intracatheter Q12H  . sodium chloride flush  3 mL Intravenous Q12H   Continuous Infusions: . diltiazem (CARDIZEM) infusion Stopped (01/28/18 1704)   PRN Meds: acetaminophen **OR** acetaminophen, albuterol, magnesium hydroxide, Melatonin, metoprolol tartrate, ondansetron **OR** ondansetron (ZOFRAN) IV, RESOURCE THICKENUP CLEAR, sodium chloride flush, traMADol   Vital Signs    Vitals:   01/30/18 1933 01/30/18 2216 01/31/18 0031 01/31/18 0417  BP: (!) 98/49 112/67 116/64 136/70  Pulse: 74 69 92 81  Resp: (!) 22 (!) 22 (!) 24 20  Temp: 97.8 F (36.6 C) 98.2 F (36.8 C) 98.3 F (36.8 C) 98.6 F (37 C)  TempSrc: Oral Axillary Oral Oral  SpO2: 93% 92% 92% 94%  Weight:      Height:        Intake/Output Summary (Last 24 hours) at 01/31/2018 0924 Last data filed at 01/31/2018 0648 Gross per 24 hour  Intake -  Output 1100 ml  Net -1100 ml   Filed Weights   01/26/18 2126  Weight: 57 kg      Physical Exam   GEN- The patient is elderly appearing, alert and oriented x 3 today.   Head- normocephalic, atraumatic Eyes-  Sclera clear, conjunctiva pink Ears- hearing intact Oropharynx- clear Neck- supple, Lungs- Clear to ausculation bilaterally, normal work of breathing Heart- Regular rate and rhythm  GI- soft, NT, ND, + BS Extremities- no clubbing, cyanosis, or edema  MS- diffuse atrophy Skin- no rash or lesion Psych- euthymic mood, full affect Neuro- strength and sensation are intact   Labs     Chemistry Recent Labs  Lab 01/26/18 2211  01/29/18 1154 01/30/18 0647 01/31/18 0530  NA 130*   < > 143 144 142  K 5.4*   < > 3.7 3.8 4.0  CL 94*   < > 112* 110 104  CO2 22   < > 20* 24 29  GLUCOSE 129*   < > 74 93 104*  BUN 35*   < > 23 20 23   CREATININE 0.93   < > 0.81 0.71 0.77  CALCIUM 8.0*   < > 8.0* 8.6* 8.7*  PROT 5.5*  --   --   --   --   ALBUMIN 2.4*  --   --   --   --   AST 25  --   --   --   --   ALT 21  --   --   --   --   ALKPHOS 145*  --   --   --   --   BILITOT 1.5*  --   --   --   --   GFRNONAA 51*   < > >60 >60 >60  GFRAA 59*   < > >60 >60 >60  ANIONGAP 14   < > 11 10 9    < > = values in this interval not displayed.     Hematology Recent Labs  Lab 01/28/18 [phone]  01/29/18 0003 01/29/18 1154 01/31/18 0530  WBC 23.2*  --  17.7* 10.4  RBC 2.38*  --  4.06 4.40  HGB 7.1* 12.5 11.8* 13.0  HCT 22.1* 37.5 36.2 39.1  MCV 92.9  --  89.2 88.9  MCH 29.8  --  29.1 29.5  MCHC 32.1  --  32.6 33.2  RDW 14.5  --  15.4 15.4  PLT 452*  --  399 308    Cardiac Enzymes Recent Labs  Lab 01/29/18 0003 01/29/18 0334 01/29/18 0812  TROPONINI 3.08* 4.11* 3.51*    Recent Labs  Lab 01/27/18 0057  TROPIPOC 0.06       Patient Profile     84 y.o. female w/ hx AAA, GERD, reported CAD but no documentation, of this, HTN, was admitted 09/23 w/ SIRS, ?UGIB, hypotension, rapid atrial fib.   Assessment & Plan    1.  1. Acute respiratory failure/ pulmonary HTN Slowly improving Continue IV lasix Could consider switching to PO lasix tomorrow  2. Persistent afib Given anemia, not currently a candidate for anticoagulation  3. Anemia Per primary team OK to proceed with EGD from CV standpoint.  Hillis Range MD, [facility] 01/31/2018 9:24 AM

## 2018-01-31 NOTE — Progress Notes (Signed)
PROGRESS NOTE    Krystal Patrick  GLO:[id number] DOB: 07-Feb-1921 DOA: 01/26/2018 PCP: Kaleen Mask, MD    Brief Narrative:   Krystal Patrick is a 84 y.o. female with medical history significant of HTN, CAD, AAA, and depression; who presents with altered mental status.   Patient just recently been hospitalized from 9/5-9/9, after having a fall with complaints of low back pain and sciatica.  Patient had been sent to [facility] where she was nearing discharge. On admission, pt was afebrile, tachycardic, hypotensive, elevated wbc count,  UA, and CXR negative. CT abd and pelvis showing 3.5 infra renal AAA aneurysm and esophagitis.  Her stool for occult blood was positive and her hemoglobin dropped from 9 to 7.1 this admission. She was transferred to step down foro acute respiratory failure with hypoxia sec to acute diastolic heart failure.    Assessment & Plan:   Principal Problem:   SIRS (systemic inflammatory response syndrome) (HCC) Active Problems:   AAA (abdominal aortic aneurysm) without rupture (HCC)   GERD (gastroesophageal reflux disease)   Acute metabolic encephalopathy   Hypotension   Acute blood loss anemia    Acute respiratory failure with hypoxia: improved.  Suspect secondary to fluid overload, mild acute on chronic diastolic heart failure.  She received about 4.8 liters of fluid since admission.  Transferred the patient to stepdown for possible BIPAP and closer monitoring. CXR showed CHF, with vascular congestion.   Started her on lasix, diuresed about 1.1 lit over the last 24 hours, monitor on IV lasix. Cardiology consulted , appreciate recommendations.  Monitor creatinine and potassium on IV lasix.  Continue IV lasix for another 24 hours and transition to oral lasix in am.    Atrial fibrillation with RVR:  Possibly from CHF and demand ischemia from acute anemia. Rate controlled this am.  Elevated troponins possibly from demand ischemia from  CHF. Start her on low dose metoprolol 12.5 mg BID.   GI bleed/ acute anemia of blood loss:  Patient's baseline hemoglobin around 14, dropped to 7.1 .  Stool for occult blood is positive. CT abd and pelvis showed esophagitis and Possible diverticula of the medial wall of the second portion of the duodenum and Extensive sigmoid diverticulosis. 2 units of prbc transfusion ordered. Post transfusion lasix IV 20 mg  given.  GI consulted from [facility] for possible endoscopy.  IV PPI BID ordered.  Continue with PPI, change to oral when able to take oral.  Repeat H&H around 12.  GI recommends outpatient follow up.  No obvious bleeding per rectum. WILL request GI for endoscopy in am.     Acute metabolic encephalopathy: suspect from the gabapentin that was recently started at [facility] for sciatica.  Initial CT head negative.  - decreased the frequency TID to daily dose of gabapentin.  - pain control with physical therapy.  - pt appears to be back to baseline, she Is alert and answering questions appropriately.  - no changes in meds.   Nutrition:  SLP evaluated, recommended dysphagia 3 diet.   SIRS on admission:  She was empirically started on IV antibiotics,but no source of infection found. Discontinued  abx and monitor.    Leukocytosis:  Suspect reactive.     DVT prophylaxis:scd's Code Status: Full code.  Family Communication:  None at bedside today.  Disposition Plan: SNF on discharge. Possibly after gi work up.   Consultants:   Gastroenterology Corinda Gubler.   Cardiology.   Procedures: None   Antimicrobials: None.   Subjective: No chest  pain or sob. Breathing better.   Objective: Vitals:   01/31/18 0031 01/31/18 0417 01/31/18 1400 01/31/18 1838  BP: 116/64 136/70 (!) 85/59 119/76  Pulse: 92 81 96 75  Resp: (!) 24 20  16   Temp: 98.3 F (36.8 C) 98.6 F (37 C) 97.9 F (36.6 C) 98.3 F (36.8 C)  TempSrc: Oral Oral Oral Oral  SpO2: 92% 94%  99%  Weight:      Height:         Intake/Output Summary (Last 24 hours) at 01/31/2018 1840 Last data filed at 01/31/2018 0648 Gross per 24 hour  Intake -  Output 1100 ml  Net -1100 ml   Filed Weights   01/26/18 2126  Weight: 57 kg    Examination:  General exam: alert and comfortable on RA. No distress noted.  Respiratory system: diminished at bases, not wheezing or rhonchi.  Cardiovascular system: S1 & S2 heard, irregular, no JVD or murmer.  Gastrointestinal system: Abdomen is soft non tender non distended bowel sounds good.  Central nervous system: alert and answering questions appropriately.  Extremities: trace edema on the upper extremities., which is improving.  Skin: No rashes, lesions or ulcers Psychiatry: calm and comfortable.     Data Reviewed: I have personally reviewed following labs and imaging studies  CBC: Recent Labs  Lab 01/26/18 2211 01/27/18 1359 01/28/18 0311 01/29/18 0003 01/29/18 1154 01/31/18 0530  WBC 29.8* 32.0* 23.2*  --  17.7* 10.4  NEUTROABS 26.8* 29.3*  --   --   --   --   HGB 9.7* 8.9* 7.1* 12.5 11.8* 13.0  HCT 29.4* 26.7* 22.1* 37.5 36.2 39.1  MCV 91.0 91.4 92.9  --  89.2 88.9  PLT PLATELET CLUMPS NOTED ON SMEAR, COUNT APPEARS ADEQUATE 531* 452*  --  399 308   Basic Metabolic Panel: Recent Labs  Lab 01/27/18 1359 01/28/18 0311 01/29/18 1154 01/30/18 0647 01/31/18 0530  NA 136 138 143 144 142  K 4.4 4.5 3.7 3.8 4.0  CL 105 111 112* 110 104  CO2 21* 22 20* 24 29  GLUCOSE 104* 109* 74 93 104*  BUN 29* 31* 23 20 23   CREATININE 0.67 0.64 0.81 0.71 0.77  CALCIUM 7.5* 7.2* 8.0* 8.6* 8.7*   GFR: Estimated Creatinine Clearance: 33.3 mL/min (by C-G formula based on SCr of 0.77 mg/dL). Liver Function Tests: Recent Labs  Lab 01/26/18 2211  AST 25  ALT 21  ALKPHOS 145*  BILITOT 1.5*  PROT 5.5*  ALBUMIN 2.4*   No results for input(s): LIPASE, AMYLASE in the last 168 hours. No results for input(s): AMMONIA in the last 168 hours. Coagulation Profile: No  results for input(s): INR, PROTIME in the last 168 hours. Cardiac Enzymes: Recent Labs  Lab 01/29/18 0003 01/29/18 0334 01/29/18 0812  TROPONINI 3.08* 4.11* 3.51*   BNP (last 3 results) No results for input(s): PROBNP in the last 8760 hours. HbA1C: No results for input(s): HGBA1C in the last 72 hours. CBG: No results for input(s): GLUCAP in the last 168 hours. Lipid Profile: No results for input(s): CHOL, HDL, LDLCALC, TRIG, CHOLHDL, LDLDIRECT in the last 72 hours. Thyroid Function Tests: No results for input(s): TSH, T4TOTAL, FREET4, T3FREE, THYROIDAB in the last 72 hours. Anemia Panel: No results for input(s): VITAMINB12, FOLATE, FERRITIN, TIBC, IRON, RETICCTPCT in the last 72 hours. Sepsis Labs: Recent Labs  Lab 01/26/18 2233 01/27/18 1359 01/29/18 0003 01/29/18 0334  PROCALCITON  --  0.17  --   --   LATICACIDVEN 1.70  --  2.4* 1.3    Recent Results (from the past 240 hour(s))  Blood Culture (routine x 2)     Status: None (Preliminary result)   Collection Time: 01/27/18 12:44 AM  Result Value Ref Range Status   Specimen Description   Final    BLOOD RIGHT UPPER ARM Performed at [facility], [address] [place], [place] [address]    Special Requests   Final    BOTTLES DRAWN AEROBIC AND ANAEROBIC Blood Culture adequate volume Performed at [facility], [address] [place], [place] [address]    Culture   Final    NO GROWTH 4 DAYS Performed at [facility], [address], [place], [place] [address]    Report Status PENDING  Incomplete  MRSA PCR Screening     Status: None   Collection Time: 01/27/18 11:07 AM  Result Value Ref Range Status   MRSA by PCR NEGATIVE NEGATIVE Final    Comment:        The GeneXpert MRSA Assay (FDA approved for NASAL specimens only), is one component of a comprehensive MRSA colonization surveillance program. It is not intended to diagnose MRSA infection nor to guide or monitor treatment  for MRSA infections. Performed at [facility], [address], [place], [place] [address]   Culture, Urine     Status: None   Collection Time: 01/27/18 11:26 AM  Result Value Ref Range Status   Specimen Description   Final    Urine Performed at [facility], [address] [place], [place] [address]    Special Requests   Final    NONE Performed at [facility], [address], [place], [place] [address]    Culture   Final    NO GROWTH Performed at [facility], [address], [place], [place] [address]    Report Status 01/28/2018 FINAL  Final  Blood Culture (routine x 2)     Status: None (Preliminary result)   Collection Time: 01/27/18  2:14 PM  Result Value Ref Range Status   Specimen Description   Final    BLOOD LEFT HAND Performed at [facility], [address] [place], [place] [address]    Special Requests   Final    BOTTLES DRAWN AEROBIC ONLY Blood Culture adequate volume Performed at [facility], [address] [place], [place] [address]    Culture   Final    NO GROWTH 4 DAYS Performed at [facility], [address], [place], [place] [address]    Report Status PENDING  Incomplete         Radiology Studies: Dg Swallowing Func-speech Pathology  Result Date: 01/30/2018 Objective Swallowing Evaluation: Type of Study: MBS-Modified Barium Swallow Study  Patient Details Name: Kyira Volkert MRN: [id number] Date of Birth: [birth year] Today's Date: 01/30/2018 Time: SLP Start Time (ACUTE ONLY): 9563 -SLP Stop Time (ACUTE ONLY): 0906 SLP Time Calculation (min) (ACUTE ONLY): 31 min Past Medical History: Past Medical History: Diagnosis Date . AAA (abdominal aortic aneurysm) (HCC)  . CAD (coronary artery disease)  . GERD (gastroesophageal reflux disease)  . Hypertension  Past Surgical History: Past Surgical History: Procedure Laterality Date . CHOLECYSTECTOMY   .  SMALL BOWEL REPAIR   HPI: Patient is a 84 yr old female admit with increased cough/congestion from SNF.  Recent admit 01/08/2018 with diagnosis of near syncopal episode and fall at home 5 days prior to  admission.  PMH: AAA, HTN, CAD, GERD, depression.  Swallow evaluation ordered.  Family present and reports pt had increased cough over the last few weeks and reported problems swallowing stating it was difficult to get food down and effortful swallow with poor intake.  CXR negative yesterday.  Imaging study showed esophagitis with rec for endoscopy after acute esoph issues resolve.  Pt with respiratory issues during hospital admit and thus required transfer to ICU and Bipap usage - ? fluid overload?  Subjective: pt awake in flouro chair Assessment / Plan / Recommendation CHL IP CLINICAL IMPRESSIONS 01/30/2018 Clinical Impression Patient presents with moderately severe pharyngeal dysphagia with sensorimotor deficits and ? mechanical component due to anterior curvature of spin impacting epiglottic deflection.  Decreased laryngeal elevation/tongue base retraction results in very poor epiglottic deflection and thus significant pharyngeal (vallecular more than pyriform) residuals WITHOUT pt awareness.  Laryngeal penetration of thin/nectar due to decrease pharyngeal clearance noted- but does clear larynx with further swallows.  Cued dry swallows marginally effective to decrease residuals and following solids with liquids with chin tuck help to mitigate.  Barium tablet given with pudding precariously lodged at vallecular region - not clearing with thin and next pudding bolus - transited to pyriform sinus and 3rd bolus of pudding with dry swallow cleared it into esophagus.  Pt does not fully clear vallecular space and thus will be high aspiration risk.  Throat clearing and reswallow helpful but also difficult for pt to conduct.  Recommend if pt's pill are of any size to CRUSH them if not contraindicated.  Chin tuck posture  decreased laryngeal penetration of minimal even with sequential straw swallows.  Thin via tsp with head neutral was not aspirated or penetrated.  Pt remains aspiration risk due to sensorimotor deficits .  Recommend dys3/ground meats/thin with strict precautions.  SLP to follow up - suspect some chronic component to dysphagia with exacerbation due to deconditioning given pt's sensory deficit.  SLP Visit Diagnosis Dysphagia, pharyngeal phase (R13.13);Dysphagia, pharyngoesophageal phase (R13.14) Attention and concentration deficit following -- Frontal lobe and executive function deficit following -- Impact on safety and function Moderate aspiration risk   CHL IP TREATMENT RECOMMENDATION 01/30/2018 Treatment Recommendations Therapy as outlined in treatment plan below   Prognosis 01/30/2018 Prognosis for Safe Diet Advancement Good Barriers to Reach Goals Time post onset;Other (Comment) Barriers/Prognosis Comment -- CHL IP DIET RECOMMENDATION 01/30/2018 SLP Diet Recommendations Dysphagia 3 (Mech soft) solids;Thin liquid Liquid Administration via Straw Medication Administration Crushed with puree Compensations Slow rate;Small sips/bites;Multiple dry swallows after each bite/sip;Effortful swallow;Chin tuck;Use straw to facilitate chin tuck Postural Changes Remain semi-upright after after feeds/meals (Comment);Seated upright at 90 degrees   CHL IP OTHER RECOMMENDATIONS 01/30/2018 Recommended Consults -- Oral Care Recommendations Oral care BID Other Recommendations --   CHL IP FOLLOW UP RECOMMENDATIONS 01/30/2018 Follow up Recommendations Skilled Nursing facility   [facility] IP FREQUENCY AND DURATION 01/28/2018 Speech Therapy Frequency (ACUTE ONLY) min 1 x/week Treatment Duration --      CHL IP ORAL PHASE 01/30/2018 Oral Phase Impaired Oral - Pudding Teaspoon -- Oral - Pudding Cup -- Oral - Honey Teaspoon -- Oral - Honey Cup -- Oral - Nectar Teaspoon Delayed oral transit;Premature spillage;Piecemeal swallowing Oral - Nectar Cup Delayed  oral transit;Premature spillage;Piecemeal swallowing Oral - Nectar Straw Delayed oral transit;Premature spillage;Piecemeal swallowing Oral - Thin Teaspoon Delayed oral transit;Premature spillage;Piecemeal swallowing Oral - Thin Cup Delayed oral transit;Premature spillage;Piecemeal swallowing Oral - Thin Straw Delayed oral transit;Premature spillage;Piecemeal swallowing Oral - Puree Delayed oral transit;Premature spillage;Piecemeal  swallowing Oral - Mech Soft Delayed oral transit;Premature spillage Oral - Regular -- Oral - Multi-Consistency -- Oral - Pill Delayed oral transit;Premature spillage Oral Phase - Comment --  CHL IP PHARYNGEAL PHASE 01/30/2018 Pharyngeal Phase Impaired Pharyngeal- Pudding Teaspoon -- Pharyngeal -- Pharyngeal- Pudding Cup -- Pharyngeal -- Pharyngeal- Honey Teaspoon -- Pharyngeal -- Pharyngeal- Honey Cup -- Pharyngeal -- Pharyngeal- Nectar Teaspoon Reduced epiglottic inversion;Reduced tongue base retraction;Pharyngeal residue - valleculae Pharyngeal -- Pharyngeal- Nectar Cup Reduced epiglottic inversion;Reduced tongue base retraction;Pharyngeal residue - valleculae Pharyngeal -- Pharyngeal- Nectar Straw Reduced epiglottic inversion;Reduced tongue base retraction;Pharyngeal residue - valleculae;Pharyngeal residue - pyriform Pharyngeal -- Pharyngeal- Thin Teaspoon Reduced epiglottic inversion;Reduced tongue base retraction;Pharyngeal residue - valleculae Pharyngeal -- Pharyngeal- Thin Cup Reduced epiglottic inversion;Reduced tongue base retraction;Penetration/Aspiration during swallow;Penetration/Apiration after swallow;Pharyngeal residue - valleculae;Pharyngeal residue - pyriform Pharyngeal Material enters airway, remains ABOVE vocal cords and not ejected out Pharyngeal- Thin Straw Reduced epiglottic inversion;Reduced tongue base retraction;Penetration/Aspiration during swallow;Penetration/Apiration after swallow;Pharyngeal residue - valleculae;Pharyngeal residue - pyriform Pharyngeal Material  enters airway, remains ABOVE vocal cords and not ejected out Pharyngeal- Puree -- Pharyngeal -- Pharyngeal- Mechanical Soft -- Pharyngeal -- Pharyngeal- Regular -- Pharyngeal -- Pharyngeal- Multi-consistency -- Pharyngeal -- Pharyngeal- Pill Reduced epiglottic inversion;Reduced tongue base retraction;Pharyngeal residue - valleculae;Pharyngeal residue - pyriform Pharyngeal -- Pharyngeal Comment chin tuck posture   CHL IP CERVICAL ESOPHAGEAL PHASE 01/30/2018 Cervical Esophageal Phase Impaired Pudding Teaspoon -- Pudding Cup -- Honey Teaspoon -- Honey Cup -- Nectar Teaspoon -- Nectar Cup -- Nectar Straw -- Thin Teaspoon -- Thin Cup -- Thin Straw -- Puree -- Mechanical Soft -- Regular -- Multi-consistency -- Pill -- Cervical Esophageal Comment appearance of "wide esophagus" with barium coating esophagus, barium tablet lodged at GE, did not clear with multiple boluses of thin or pudding Chales Abrahams 01/30/2018, 9:38 AM  Donavan Burnet, MS [facility] SLP Acute Rehab Services Pager [phone] Office [phone]                  Scheduled Meds: . sodium chloride   Intravenous Once  . capsaicin  1 application Topical Daily  . feeding supplement (ENSURE ENLIVE)  237 mL Oral BID BM  . furosemide  20 mg Intravenous Q12H  . gabapentin  300 mg Oral Daily  . metoprolol tartrate  12.5 mg Oral BID  . pantoprazole  40 mg Intravenous Q12H  . potassium chloride  40 mEq Oral Daily  . sodium chloride flush  10-40 mL Intracatheter Q12H  . sodium chloride flush  3 mL Intravenous Q12H   Continuous Infusions: . diltiazem (CARDIZEM) infusion Stopped (01/28/18 1704)     LOS: 4 days    Time spent: 35 minutes.    Kathlen Mody, MD [facility] Pager [phone]  If 7PM-7AM, please contact night-coverage [email or web address] Password [facility] 01/31/2018, 6:40 PM

## 2018-02-01 ENCOUNTER — Inpatient Hospital Stay (HOSPITAL_COMMUNITY): Payer: Medicare Other

## 2018-02-01 DIAGNOSIS — I481 Persistent atrial fibrillation: Secondary | ICD-10-CM

## 2018-02-01 DIAGNOSIS — D62 Acute posthemorrhagic anemia: Secondary | ICD-10-CM

## 2018-02-01 DIAGNOSIS — R001 Bradycardia, unspecified: Secondary | ICD-10-CM

## 2018-02-01 LAB — CULTURE, BLOOD (ROUTINE X 2)
CULTURE: NO GROWTH
Culture: NO GROWTH
SPECIAL REQUESTS: ADEQUATE
Special Requests: ADEQUATE

## 2018-02-01 MED ORDER — PANTOPRAZOLE SODIUM 40 MG PO TBEC
40.0000 mg | DELAYED_RELEASE_TABLET | Freq: Two times a day (BID) | ORAL | Status: DC
  Administered 2018-02-01 – 2018-02-07 (×11): 40 mg via ORAL
  Filled 2018-02-01 (×11): qty 1

## 2018-02-01 NOTE — Progress Notes (Signed)
Central tele notified nurse that pts HR dropped to 28. MD nottified through Amion.

## 2018-02-01 NOTE — Progress Notes (Signed)
Pt had 4 sec pause, On-Call notified. Pt asymptomatic. Strip saved on tele monitor. Will continue to monitor.  Justin Mend, RN

## 2018-02-01 NOTE — Progress Notes (Signed)
PROGRESS NOTE    Krystal Patrick  WJX:[id number] DOB: Sep 05, 1920 DOA: 01/26/2018 PCP: Kaleen Mask, MD    Brief Narrative:   Krystal Patrick is a 84 y.o. female with medical history significant of HTN, CAD, AAA, and depression; who presents with altered mental status.   Patient just recently been hospitalized from 9/5-9/9, after having a fall with complaints of low back pain and sciatica.  Patient had been sent to [facility] where she was nearing discharge. On admission, pt was afebrile, tachycardic, hypotensive, elevated wbc count,  UA, and CXR negative. CT abd and pelvis showing 3.5 infra renal AAA aneurysm and esophagitis.  Her stool for occult blood was positive and her hemoglobin dropped from 9 to 7.1 this admission. She was transferred to step down foro acute respiratory failure with hypoxia sec to acute diastolic heart failure.    Assessment & Plan:   Principal Problem:   SIRS (systemic inflammatory response syndrome) (HCC) Active Problems:   AAA (abdominal aortic aneurysm) without rupture (HCC)   GERD (gastroesophageal reflux disease)   Acute metabolic encephalopathy   Hypotension   Acute blood loss anemia    Acute respiratory failure with hypoxia: improved.  Suspect secondary to fluid overload, mild acute on chronic diastolic heart failure.  She received about 4.8 liters of fluid since admission.  Transferred the patient to stepdown for possible BIPAP and closer monitoring. CXR showed CHF, with vascular congestion. She was started on IV lasix and diuresed about 4 lit since admission.  Cardiology consulted , appreciate recommendations.  Monitor creatinine and potassium on IV lasix.  Continue IV lasix for another 24 hours and transition to oral lasix in am.    Atrial fibrillation with RVR:  Possibly from CHF and demand ischemia from acute anemia. Rate controlled this am.  Elevated troponins possibly from demand ischemia from CHF. Pt has  bradycardia, so we will discontinue metoprolol.   GI bleed/ acute anemia of blood loss:  Patient's baseline hemoglobin around 14, dropped to 7.1 .  Stool for occult blood is positive. CT abd and pelvis showed esophagitis and Possible diverticula of the medial wall of the second portion of the duodenum and Extensive sigmoid diverticulosis. 2 units of prbc transfusion ordered. Post transfusion lasix IV 20 mg  given.  GI consulted from [facility] for possible endoscopy.  IV PPI BID ordered.  Continue with PPI, change to oral when able to take oral.  Repeat H&H around 12.  GI recommends outpatient follow up.  No obvious bleeding per rectum. Will  request GI opinion for endoscopy in am.     Acute metabolic encephalopathy: suspect from the gabapentin that was recently started at [facility] for sciatica.  Initial CT head negative.  - decreased the frequency TID to daily dose of gabapentin.  - pain control with physical therapy.  - pt appears to be back to baseline, she Is alert and answering questions appropriately.  - no changes in meds.   Nutrition:  SLP evaluated, recommended dysphagia 3 diet.   SIRS on admission:  She was empirically started on IV antibiotics,but no source of infection found. Discontinued  abx and monitor.    Leukocytosis:  Suspect reactive. Cbc shows normal wbc count.     DVT prophylaxis:scd's Code Status: Full code.  Family Communication:  Grand son at bedside.  Disposition Plan: SNF on discharge. Possibly after gi work up.   Consultants:   Gastroenterology Corinda Gubler.   Cardiology.   Procedures:  Echo.   Antimicrobials: None.   Subjective:  No chest pain or sob. Breathing better. No nausea or vomiting.   Objective: Vitals:   01/31/18 1838 01/31/18 2042 01/31/18 2217 02/01/18 0507  BP: 119/76 120/67 (!) 110/55 115/65  Pulse: 75 75 74 71  Resp: 16 18  16   Temp: 98.3 F (36.8 C) (!) 97.5 F (36.4 C)  97.8 F (36.6 C)  TempSrc: Oral Oral  Oral  SpO2: 99%  98%  96%  Weight:      Height:        Intake/Output Summary (Last 24 hours) at 02/01/2018 1514 Last data filed at 02/01/2018 0508 Gross per 24 hour  Intake 3 ml  Output 500 ml  Net -497 ml   Filed Weights   01/26/18 2126  Weight: 57 kg    Examination:  General exam: comfortable on RA.  Respiratory system: AIR ENTRY FAIR,  Cardiovascular system: S1 & S2 heard, irregular, no JVD or murmer.  Gastrointestinal system: Abdomen is soft NT ND BS+ Central nervous system: alert and answering questions appropriately.  Extremities: trace edema on the upper extremities., which is improving.  Skin: No rashes, lesions or ulcers Psychiatry: calm and comfortable.     Data Reviewed: I have personally reviewed following labs and imaging studies  CBC: Recent Labs  Lab 01/26/18 2211 01/27/18 1359 01/28/18 0311 01/29/18 0003 01/29/18 1154 01/31/18 0530  WBC 29.8* 32.0* 23.2*  --  17.7* 10.4  NEUTROABS 26.8* 29.3*  --   --   --   --   HGB 9.7* 8.9* 7.1* 12.5 11.8* 13.0  HCT 29.4* 26.7* 22.1* 37.5 36.2 39.1  MCV 91.0 91.4 92.9  --  89.2 88.9  PLT PLATELET CLUMPS NOTED ON SMEAR, COUNT APPEARS ADEQUATE 531* 452*  --  399 308   Basic Metabolic Panel: Recent Labs  Lab 01/27/18 1359 01/28/18 0311 01/29/18 1154 01/30/18 0647 01/31/18 0530  NA 136 138 143 144 142  K 4.4 4.5 3.7 3.8 4.0  CL 105 111 112* 110 104  CO2 21* 22 20* 24 29  GLUCOSE 104* 109* 74 93 104*  BUN 29* 31* 23 20 23   CREATININE 0.67 0.64 0.81 0.71 0.77  CALCIUM 7.5* 7.2* 8.0* 8.6* 8.7*   GFR: Estimated Creatinine Clearance: 33.3 mL/min (by C-G formula based on SCr of 0.77 mg/dL). Liver Function Tests: Recent Labs  Lab 01/26/18 2211  AST 25  ALT 21  ALKPHOS 145*  BILITOT 1.5*  PROT 5.5*  ALBUMIN 2.4*   No results for input(s): LIPASE, AMYLASE in the last 168 hours. No results for input(s): AMMONIA in the last 168 hours. Coagulation Profile: No results for input(s): INR, PROTIME in the last 168  hours. Cardiac Enzymes: Recent Labs  Lab 01/29/18 0003 01/29/18 0334 01/29/18 0812  TROPONINI 3.08* 4.11* 3.51*   BNP (last 3 results) No results for input(s): PROBNP in the last 8760 hours. HbA1C: No results for input(s): HGBA1C in the last 72 hours. CBG: No results for input(s): GLUCAP in the last 168 hours. Lipid Profile: No results for input(s): CHOL, HDL, LDLCALC, TRIG, CHOLHDL, LDLDIRECT in the last 72 hours. Thyroid Function Tests: No results for input(s): TSH, T4TOTAL, FREET4, T3FREE, THYROIDAB in the last 72 hours. Anemia Panel: No results for input(s): VITAMINB12, FOLATE, FERRITIN, TIBC, IRON, RETICCTPCT in the last 72 hours. Sepsis Labs: Recent Labs  Lab 01/26/18 2233 01/27/18 1359 01/29/18 0003 01/29/18 0334  PROCALCITON  --  0.17  --   --   LATICACIDVEN 1.70  --  2.4* 1.3    Recent Results (  from the past 240 hour(s))  Blood Culture (routine x 2)     Status: None   Collection Time: 01/27/18 12:44 AM  Result Value Ref Range Status   Specimen Description   Final    BLOOD RIGHT UPPER ARM Performed at [facility], [address] [place], [place] [address]    Special Requests   Final    BOTTLES DRAWN AEROBIC AND ANAEROBIC Blood Culture adequate volume Performed at [facility], [address] [place], [place] [address]    Culture   Final    NO GROWTH 5 DAYS Performed at [facility], [address] [place], [place] [address]    Report Status 02/01/2018 FINAL  Final  MRSA PCR Screening     Status: None   Collection Time: 01/27/18 11:07 AM  Result Value Ref Range Status   MRSA by PCR NEGATIVE NEGATIVE Final    Comment:        The GeneXpert MRSA Assay (FDA approved for NASAL specimens only), is one component of a comprehensive MRSA colonization surveillance program. It is not intended to diagnose MRSA infection nor to guide or monitor treatment for MRSA infections. Performed at [facility], [address], [place], [place] [address]   Culture, Urine     Status: None   Collection Time: 01/27/18 11:26 AM  Result Value Ref Range Status   Specimen Description   Final    Urine Performed at [facility], [address] [place], [place] [address]    Special Requests   Final    NONE Performed at [facility], [address] [place], [place] [address]    Culture   Final    NO GROWTH Performed at [facility], [address] [place], [place] [address]    Report Status 01/28/2018 FINAL  Final  Blood Culture (routine x 2)     Status: None   Collection Time: 01/27/18  2:14 PM  Result Value Ref Range Status   Specimen Description   Final    BLOOD LEFT HAND Performed at [facility], [address] [place], [place] [address]    Special Requests   Final    BOTTLES DRAWN AEROBIC ONLY Blood Culture adequate volume Performed at [facility], [address], [place], [place] [address]    Culture   Final    NO GROWTH 5 DAYS Performed at [facility], [address], [place], [place] [address]    Report Status 02/01/2018 FINAL  Final         Radiology Studies: Dg Chest Port 1 View  Result Date: 02/01/2018 CLINICAL DATA:  Hypertension, coronary artery disease EXAM: PORTABLE CHEST 1 VIEW COMPARISON:  01/28/2018, 01/27/2018, 01/09/2018 FINDINGS: Aeration is slightly improved. Small right-sided pleural effusion and right basilar airspace disease. Decreased vascular congestion and edema. Stable enlarged cardiomediastinal silhouette with aortic atherosclerosis. Dense mitral calcification. IMPRESSION: 1. Slightly improved aeration with decreased vascular congestion and edema 2. Small right pleural effusion with right basilar airspace disease. Electronically Signed   By: Jasmine Pang M.D.   On: 02/01/2018 14:30        Scheduled Meds: . sodium chloride   Intravenous Once  . capsaicin  1  application Topical Daily  . feeding supplement (ENSURE ENLIVE)  237 mL Oral BID BM  . furosemide  20 mg Intravenous Q12H  . gabapentin  300 mg Oral Daily  .  pantoprazole  40 mg Intravenous Q12H  . potassium chloride  40 mEq Oral Daily  . sodium chloride flush  10-40 mL Intracatheter Q12H  . sodium chloride flush  3 mL Intravenous Q12H   Continuous Infusions:    LOS: 5 days    Time spent: 35 minutes.    Kathlen Mody, MD [facility] Pager [phone]  If 7PM-7AM, please contact night-coverage [email or web address] Password [facility] 02/01/2018, 3:14 PM

## 2018-02-01 NOTE — Progress Notes (Signed)
Progress Note   Subjective   Doing well today, the patient denies CP or SOB.  She has had some sinus bradycardia but without any symptoms of dizziness or presyncope.  No new concerns  Inpatient Medications    Scheduled Meds: . sodium chloride   Intravenous Once  . capsaicin  1 application Topical Daily  . feeding supplement (ENSURE ENLIVE)  237 mL Oral BID BM  . furosemide  20 mg Intravenous Q12H  . gabapentin  300 mg Oral Daily  . pantoprazole  40 mg Intravenous Q12H  . potassium chloride  40 mEq Oral Daily  . sodium chloride flush  10-40 mL Intracatheter Q12H  . sodium chloride flush  3 mL Intravenous Q12H   Continuous Infusions:  PRN Meds: acetaminophen **OR** acetaminophen, albuterol, magnesium hydroxide, Melatonin, metoprolol tartrate, ondansetron **OR** ondansetron (ZOFRAN) IV, RESOURCE THICKENUP CLEAR, sodium chloride flush, traMADol   Vital Signs    Vitals:   01/31/18 1838 01/31/18 2042 01/31/18 2217 02/01/18 0507  BP: 119/76 120/67 (!) 110/55 115/65  Pulse: 75 75 74 71  Resp: 16 18  16   Temp: 98.3 F (36.8 C) (!) 97.5 F (36.4 C)  97.8 F (36.6 C)  TempSrc: Oral Oral  Oral  SpO2: 99% 98%  96%  Weight:      Height:        Intake/Output Summary (Last 24 hours) at 02/01/2018 0837 Last data filed at 02/01/2018 1610 Gross per 24 hour  Intake 3 ml  Output 500 ml  Net -497 ml   Filed Weights   01/26/18 2126  Weight: 57 kg    Telemetry    Sinus rhythm, sinus bradycardia with pauses are noted - Personally Reviewed  Physical Exam   GEN- The patient is elderly appearing, alert and oriented x 3 today.   Head- normocephalic, atraumatic Eyes-  Sclera clear, conjunctiva pink Ears- hearing intact Oropharynx- clear Neck- supple, Lungs- Clear to ausculation bilaterally, normal work of breathing Heart- Regular rate and rhythm  GI- soft, NT, ND, + BS Extremities- no clubbing, cyanosis, or edema  Skin- no rash or lesion Psych- euthymic mood, full  affect Neuro- strength and sensation are intact   Labs    Chemistry Recent Labs  Lab 01/26/18 2211  01/29/18 1154 01/30/18 0647 01/31/18 0530  NA 130*   < > 143 144 142  K 5.4*   < > 3.7 3.8 4.0  CL 94*   < > 112* 110 104  CO2 22   < > 20* 24 29  GLUCOSE 129*   < > 74 93 104*  BUN 35*   < > 23 20 23   CREATININE 0.93   < > 0.81 0.71 0.77  CALCIUM 8.0*   < > 8.0* 8.6* 8.7*  PROT 5.5*  --   --   --   --   ALBUMIN 2.4*  --   --   --   --   AST 25  --   --   --   --   ALT 21  --   --   --   --   ALKPHOS 145*  --   --   --   --   BILITOT 1.5*  --   --   --   --   GFRNONAA 51*   < > >60 >60 >60  GFRAA 59*   < > >60 >60 >60  ANIONGAP 14   < > 11 10 9    < > = values in this  interval not displayed.     Hematology Recent Labs  Lab 01/28/18 0311 01/29/18 0003 01/29/18 1154 01/31/18 0530  WBC 23.2*  --  17.7* 10.4  RBC 2.38*  --  4.06 4.40  HGB 7.1* 12.5 11.8* 13.0  HCT 22.1* 37.5 36.2 39.1  MCV 92.9  --  89.2 88.9  MCH 29.8  --  29.1 29.5  MCHC 32.1  --  32.6 33.2  RDW 14.5  --  15.4 15.4  PLT 452*  --  399 308    Cardiac Enzymes Recent Labs  Lab 01/29/18 0003 01/29/18 0334 01/29/18 0812  TROPONINI 3.08* 4.11* 3.51*    Recent Labs  Lab 01/27/18 0057  TROPIPOC 0.06         Patient Profile   84 y.o.femalew/ hx AAA, GERD, reported CAD but no documentation, of this, HTN, was admitted 09/23 w/ SIRS, ?UGIB, hypotension, rapid atrial fib.  She has also had sinus bradycardia observed.  Assessment & Plan    1.  Sinus bradycardia Likely vagal in etiology.  Will continue to follow conservatively Avoid chronodepressant agents as able Given her advanced age and absence of symptoms, I would not advise pacemaker in this patient currently.  2. Persistent afib Not a candidate for anticoagulation currently due to anemia  3. Anemia Ok to proceed with EGD from CV standpoint Primary team to manage  4. Acute respiratory failure, pulmonary HTN Slowly  improving Continue IV diuresis for 1 more day.  Would consider switching to oral tomorrow.  Hillis Range MD, [facility] 02/01/2018 8:37 AM

## 2018-02-02 DIAGNOSIS — Z5309 Procedure and treatment not carried out because of other contraindication: Secondary | ICD-10-CM

## 2018-02-02 DIAGNOSIS — R933 Abnormal findings on diagnostic imaging of other parts of digestive tract: Secondary | ICD-10-CM

## 2018-02-02 DIAGNOSIS — I480 Paroxysmal atrial fibrillation: Secondary | ICD-10-CM

## 2018-02-02 DIAGNOSIS — I5033 Acute on chronic diastolic (congestive) heart failure: Secondary | ICD-10-CM

## 2018-02-02 DIAGNOSIS — K219 Gastro-esophageal reflux disease without esophagitis: Secondary | ICD-10-CM

## 2018-02-02 LAB — BASIC METABOLIC PANEL
ANION GAP: 12 (ref 5–15)
BUN: 26 mg/dL — AB (ref 8–23)
CALCIUM: 8.6 mg/dL — AB (ref 8.9–10.3)
CO2: 31 mmol/L (ref 22–32)
Chloride: 103 mmol/L (ref 98–111)
Creatinine, Ser: 0.78 mg/dL (ref 0.44–1.00)
GFR calc Af Amer: >60 mL/min (ref >60)
GLUCOSE: 124 mg/dL — AB (ref 70–99)
Potassium: 3.9 mmol/L (ref 3.5–5.1)
Sodium: 146 mmol/L — ABNORMAL HIGH (ref 135–145)

## 2018-02-02 MED ORDER — FUROSEMIDE 20 MG PO TABS
20.0000 mg | ORAL_TABLET | Freq: Every day | ORAL | Status: DC
  Administered 2018-02-02: 20 mg via ORAL
  Filled 2018-02-02: qty 1

## 2018-02-02 NOTE — H&P (View-Only) (Signed)
   [facility] Progress Note   Chief Complaint:   Dysphagia, abnormal barium swallow and anemia.     SUBJECTIVE:    Feels okay. She denies swallowing problems while eating.  ASSESSMENT AND PLAN:   84-year-old female with ? Dysphagia / abnormal esophagram  / anemia.    Admitted  9/24 with SIRS (? Source) and altered mental status.  Hospital course complicated by respiratory failure requiring bipap (possibly volume overload) and rapid Afib.  We saw her for this admission for evaluation of dysphagia /  abnormal esophagus on CT scan as well as anemia in setting of family reports of black stool while in ED.  Patient was not felt to be suitable candidate for EGD until cardiopulmonary condition stabilized.  Cardiology feels patient is now stable for upper endoscopy.  -Difficult to get a true picture of anemia.  Hgb this admission down several grams from baseline ~ 14.  Hgb back up to ~ 13 with just 2 units of blood.  -Will proceed with EGD in am. The risks and benefits of EGD were discussed and the patient agrees to proceed. I will call patient's daughter to see if she has any questions / concerns EGD  ADDENDUM: I called Daughter and discussed EGD. She is comfortable proceeding with the procedure and will be at hospital around 8 am tomorrow   OBJECTIVE:     Vital signs in last 24 hours: Temp:  [97.6 F (36.4 C)-98.3 F (36.8 C)] 97.7 F (36.5 C) (09/30 0539) Pulse Rate:  [78-86] 85 (09/30 0539) Resp:  [16-18] 18 (09/30 0539) BP: (100-113)/(62-74) 113/74 (09/30 0539) SpO2:  [92 %-99 %] 92 % (09/30 0539) Last BM Date: 02/01/18 General:   Alert, female in NAD EENT:  Normal hearing, non icteric sclera, conjunctive pink.  Heart:  irreg rhythm, regular rate. No lower extremity edema Pulm: Normal respiratory effort, lungs CTA bilaterally without wheezes or crackles. Abdomen:  Soft, nondistended, nontender.  Normal bowel sounds, no masses felt.     Neurologic:  Alert and   oriented x4;  grossly normal neurologically. Psych:  Pleasant, cooperative.  Normal mood and affect.   Intake/Output from previous day: 09/29 0701 - 09/30 0700 In: -  Out: 900 [Urine:900] Intake/Output this shift: No intake/output data recorded.  Lab Results: Recent Labs    01/31/18 0530  WBC 10.4  HGB 13.0  HCT 39.1  PLT 308   BMET Recent Labs    01/31/18 0530 02/02/18 0930  NA 142 146*  K 4.0 3.9  CL 104 103  CO2 29 31  GLUCOSE 104* 124*  BUN 23 26*  CREATININE 0.77 0.78  CALCIUM 8.7* 8.6*    Dg Chest Port 1 View  Result Date: 02/01/2018 CLINICAL DATA:  Hypertension, coronary artery disease EXAM: PORTABLE CHEST 1 VIEW COMPARISON:  01/28/2018, 01/27/2018, 01/09/2018 FINDINGS: Aeration is slightly improved. Small right-sided pleural effusion and right basilar airspace disease. Decreased vascular congestion and edema. Stable enlarged cardiomediastinal silhouette with aortic atherosclerosis. Dense mitral calcification. IMPRESSION: 1. Slightly improved aeration with decreased vascular congestion and edema 2. Small right pleural effusion with right basilar airspace disease. Electronically Signed   By: Kim  Fujinaga M.D.   On: 02/01/2018 14:30     Principal Problem:   SIRS (systemic inflammatory response syndrome) (HCC) Active Problems:   AAA (abdominal aortic aneurysm) without rupture (HCC)   GERD (gastroesophageal reflux disease)   Acute metabolic encephalopathy   Hypotension   Acute blood loss anemia     LOS: 6   days   Paula Guenther ,NP 02/02/2018, 11:35 AM    Attending physician's note   I have taken an interval history, reviewed the chart and examined the patient. I agree with the Advanced Practitioner's note, impression and recommendations.  Plan for EGD tomorrow to further evaluate esophageal thickening on CT scan. The risks and benefits as well as alternatives of endoscopic procedure(s) have been discussed and reviewed. All questions answered. The  patient agrees to proceed. K. Veena Jaycelyn Orrison , MD [phone]       

## 2018-02-02 NOTE — Progress Notes (Signed)
PROGRESS NOTE    Krystal Patrick  ZOX:[id number] DOB: 04/19/1921 DOA: 01/26/2018 PCP: Kaleen Mask, MD    Brief Narrative:   Krystal Patrick is a 84 y.o. female with medical history significant of HTN, CAD, AAA, and depression; who presents with altered mental status.   Patient just recently been hospitalized from 9/5-9/9, after having a fall with complaints of low back pain and sciatica.  Patient had been sent to [facility] where she was nearing discharge. On admission, pt was afebrile, tachycardic, hypotensive, elevated wbc count,  UA, and CXR negative. CT abd and pelvis showing 3.5 infra renal AAA aneurysm and esophagitis.  Her stool for occult blood was positive and her hemoglobin dropped from 9 to 7.1 this admission. She was transferred to step down foro acute respiratory failure with hypoxia sec to acute diastolic heart failure.    Assessment & Plan:   Principal Problem:   SIRS (systemic inflammatory response syndrome) (HCC) Active Problems:   AAA (abdominal aortic aneurysm) without rupture (HCC)   GERD (gastroesophageal reflux disease)   Acute metabolic encephalopathy   Hypotension   Acute blood loss anemia    Acute respiratory failure with hypoxia: improved.  Suspect secondary to fluid overload, mild acute on chronic diastolic heart failure.  She received about 4.8 liters of fluid on admission. Transferred the patient to stepdown for possible BIPAP and closer monitoring. CXR showed CHF, with vascular congestion. She was started on IV lasix and diuresed about 4 lit since admission.  Cardiology consulted , appreciate recommendations.   lasix discontinued as per cardiology recommendations.   Atrial fibrillation with RVR:  Possibly from CHF and demand ischemia from acute anemia. Rate controlled this am.  Elevated troponins possibly from demand ischemia from CHF. Pt has bradycardia, so we will discontinue metoprolol.   GI bleed/ acute anemia of blood  loss:  Patient's baseline hemoglobin around 14, dropped to 7.1 .  Stool for occult blood is positive. CT abd and pelvis showed esophagitis and Possible diverticula of the medial wall of the second portion of the duodenum and Extensive sigmoid diverticulosis. 2 units of prbc transfusion ordered. Post transfusion lasix IV 20 mg  given.  GI consulted from [facility] for possible endoscopy. Plan for EGD tomorrow.  Repeat H&H around 13. No obvious bleeding per rectum.   Acute metabolic encephalopathy: suspect from the gabapentin that was recently started at [facility] for sciatica.  Initial CT head negative.  - decreased the frequency TID to daily dose of gabapentin.  - pain control with physical therapy.  - pt appears to be back to baseline, she Is alert and answering questions appropriately.  - no changes in meds.   Nutrition:  SLP evaluated, recommended dysphagia 3 diet.   SIRS on admission:  She was empirically started on IV antibiotics, but no source of infection found. Discontinued abx and monitor.    Leukocytosis:  Suspect reactive. Cbc shows normal wbc count.     DVT prophylaxis: scd's Code Status: Full code.  Family Communication:  Family at bedside.  Disposition Plan: SNF on discharge. Possibly after GI work up.   Consultants:   Gastroenterology Corinda Gubler.   Cardiology.   Procedures:  Echo.  EGD in am.   Antimicrobials: None.   Subjective: no chest pain or sob.   Objective: Vitals:   02/01/18 1500 02/01/18 2122 02/02/18 0539 02/02/18 1431  BP: 107/62 100/69 113/74 (!) 109/56  Pulse: 78 86 85 85  Resp: 16 17 18    Temp: 98.3 F (36.8 C)  97.6 F (36.4 C) 97.7 F (36.5 C) 98.1 F (36.7 C)  TempSrc: Oral Oral Oral Oral  SpO2: 99% 94% 92% 93%  Weight:      Height:        Intake/Output Summary (Last 24 hours) at 02/02/2018 1630 Last data filed at 02/02/2018 1225 Gross per 24 hour  Intake 60 ml  Output 900 ml  Net -840 ml   Filed Weights   01/26/18 2126  Weight:  57 kg    Examination:  General exam: comfortable. On RA.  Respiratory system: air entry fair no wheezing or rhonchi,  Cardiovascular system: S1 & S2 heard, irregular, No JVD, Gastrointestinal system: Abdomen is soft NT ND BS+ Central nervous system: it appears to be baseline.  Extremities: no cyanosis or clubbing, edema in the upper extremities improving.  Skin: No rashes, lesions or ulcers Psychiatry: calm and comfortable.     Data Reviewed: I have personally reviewed following labs and imaging studies  CBC: Recent Labs  Lab 01/26/18 2211 01/27/18 1359 01/28/18 0311 01/29/18 0003 01/29/18 1154 01/31/18 0530  WBC 29.8* 32.0* 23.2*  --  17.7* 10.4  NEUTROABS 26.8* 29.3*  --   --   --   --   HGB 9.7* 8.9* 7.1* 12.5 11.8* 13.0  HCT 29.4* 26.7* 22.1* 37.5 36.2 39.1  MCV 91.0 91.4 92.9  --  89.2 88.9  PLT PLATELET CLUMPS NOTED ON SMEAR, COUNT APPEARS ADEQUATE 531* 452*  --  399 308   Basic Metabolic Panel: Recent Labs  Lab 01/28/18 0311 01/29/18 1154 01/30/18 0647 01/31/18 0530 02/02/18 0930  NA 138 143 144 142 146*  K 4.5 3.7 3.8 4.0 3.9  CL 111 112* 110 104 103  CO2 22 20* 24 29 31   GLUCOSE 109* 74 93 104* 124*  BUN 31* 23 20 23  26*  CREATININE 0.64 0.81 0.71 0.77 0.78  CALCIUM 7.2* 8.0* 8.6* 8.7* 8.6*   GFR: Estimated Creatinine Clearance: 33.3 mL/min (by C-G formula based on SCr of 0.78 mg/dL). Liver Function Tests: Recent Labs  Lab 01/26/18 2211  AST 25  ALT 21  ALKPHOS 145*  BILITOT 1.5*  PROT 5.5*  ALBUMIN 2.4*   No results for input(s): LIPASE, AMYLASE in the last 168 hours. No results for input(s): AMMONIA in the last 168 hours. Coagulation Profile: No results for input(s): INR, PROTIME in the last 168 hours. Cardiac Enzymes: Recent Labs  Lab 01/29/18 0003 01/29/18 0334 01/29/18 0812  TROPONINI 3.08* 4.11* 3.51*   BNP (last 3 results) No results for input(s): PROBNP in the last 8760 hours. HbA1C: No results for input(s): HGBA1C in the  last 72 hours. CBG: No results for input(s): GLUCAP in the last 168 hours. Lipid Profile: No results for input(s): CHOL, HDL, LDLCALC, TRIG, CHOLHDL, LDLDIRECT in the last 72 hours. Thyroid Function Tests: No results for input(s): TSH, T4TOTAL, FREET4, T3FREE, THYROIDAB in the last 72 hours. Anemia Panel: No results for input(s): VITAMINB12, FOLATE, FERRITIN, TIBC, IRON, RETICCTPCT in the last 72 hours. Sepsis Labs: Recent Labs  Lab 01/26/18 2233 01/27/18 1359 01/29/18 0003 01/29/18 0334  PROCALCITON  --  0.17  --   --   LATICACIDVEN 1.70  --  2.4* 1.3    Recent Results (from the past 240 hour(s))  Blood Culture (routine x 2)     Status: None   Collection Time: 01/27/18 12:44 AM  Result Value Ref Range Status   Specimen Description   Final    BLOOD RIGHT UPPER ARM Performed at  [facility], [address] [place], [place] [address]    Special Requests   Final    BOTTLES DRAWN AEROBIC AND ANAEROBIC Blood Culture adequate volume Performed at [facility], [address] [place], [place] [address]    Culture   Final    NO GROWTH 5 DAYS Performed at [facility], [address], [place], [place] [address]    Report Status 02/01/2018 FINAL  Final  MRSA PCR Screening     Status: None   Collection Time: 01/27/18 11:07 AM  Result Value Ref Range Status   MRSA by PCR NEGATIVE NEGATIVE Final    Comment:        The GeneXpert MRSA Assay (FDA approved for NASAL specimens only), is one component of a comprehensive MRSA colonization surveillance program. It is not intended to diagnose MRSA infection nor to guide or monitor treatment for MRSA infections. Performed at [facility], [address], [place], [place] [address]   Culture, Urine     Status: None   Collection Time: 01/27/18 11:26 AM  Result Value Ref Range Status   Specimen Description   Final    Urine Performed at [facility], [address], [place], [place] [address]    Special Requests   Final    NONE Performed at [facility], [address] [place], [place] [address]    Culture   Final    NO GROWTH Performed at [facility], [address] [place], [place] [address]    Report Status 01/28/2018 FINAL  Final  Blood Culture (routine x 2)     Status: None   Collection Time: 01/27/18  2:14 PM  Result Value Ref Range Status   Specimen Description   Final    BLOOD LEFT HAND Performed at [facility], [address] [place], [place] [address]    Special Requests   Final    BOTTLES DRAWN AEROBIC ONLY Blood Culture adequate volume Performed at [facility], [address], [place], [place] [address]    Culture   Final    NO GROWTH 5 DAYS Performed at [facility], [address] [place], [place] [address]    Report Status 02/01/2018 FINAL  Final         Radiology Studies: Dg Chest Port 1 View  Result Date: 02/01/2018 CLINICAL DATA:  Hypertension, coronary artery disease EXAM: PORTABLE CHEST 1 VIEW COMPARISON:  01/28/2018, 01/27/2018, 01/09/2018 FINDINGS: Aeration is slightly improved. Small right-sided pleural effusion and right basilar airspace disease. Decreased vascular congestion and edema. Stable enlarged cardiomediastinal silhouette with aortic atherosclerosis. Dense mitral calcification. IMPRESSION: 1. Slightly improved aeration with decreased vascular congestion and edema 2. Small right pleural effusion with right basilar airspace disease. Electronically Signed   By: Jasmine Pang M.D.   On: 02/01/2018 14:30        Scheduled Meds: . sodium chloride   Intravenous Once  . capsaicin  1 application Topical Daily  . feeding supplement (ENSURE ENLIVE)  237 mL Oral BID BM  . gabapentin  300 mg Oral Daily  . pantoprazole  40 mg Oral BID AC  . potassium chloride  40 mEq Oral Daily  . sodium chloride flush  10-40 mL Intracatheter Q12H  .  sodium chloride flush  3 mL Intravenous Q12H   Continuous Infusions:    LOS: 6 days    Time spent: 35 minutes.  Kathlen Mody, MD [facility] Pager [phone]  If 7PM-7AM, please contact night-coverage [email or web address] Password [email or web address] 02/02/2018, 4:30 PM

## 2018-02-02 NOTE — Evaluation (Signed)
Physical Therapy Evaluation Patient Details Name: Krystal Patrick MRN: [id number] DOB: 17-Dec-1920 Today's Date: 02/02/2018   History of Present Illness  84 y.o. female with medical history significant of HTN, CAD, AAA, and depression; who presents with altered mental status and admitted 09/23 w/ SIRS, ?UGIB, hypotension, rapid atrial fib.  Patient just recently been hospitalized from 9/5-9/9, after having a fall with complaints of low back pain and sciatica.  Patient had been sent to [facility]   Clinical Impression  Pt admitted with above diagnosis. Pt currently with functional limitations due to the deficits listed below (see PT Problem List).  Pt will benefit from skilled PT to increase their independence and safety with mobility to allow discharge to the venue listed below.   Pt reports staff using a lift at SNF however pt was able to ambulate with rollator prior to last admission.  Pt reports LE and sciatica pain better and no c/o pain during session today.  Pt attempted to perform standing x3 however unable to achieve full upright posture due to generalized weakness.  HR also elevated to 150s on telemetry monitor with attempted sit to stands.  Recommend pt return to SNF for continuing rehab.    Follow Up Recommendations SNF;Supervision/Assistance - 24 hour    Equipment Recommendations  None recommended by PT    Recommendations for Other Services       Precautions / Restrictions Precautions Precautions: Fall      Mobility  Bed Mobility Overal bed mobility: Needs Assistance Bed Mobility: Supine to Sit;Sit to Supine     Supine to sit: Mod assist Sit to supine: Max assist   General bed mobility comments: verbal cues for technique, pt able to assist more coming to sit EOB, max assist for back to bed likely due to fatigue  Transfers Overall transfer level: Needs assistance Equipment used: Rolling walker (2 wheeled) Transfers: Sit to/from Stand Sit to Stand: +2  physical assistance;Max assist         General transfer comment: attempted sit to stands x3, pt barely able to clear buttocks from bed due to generalized weakness. HR also elevated to 150s with standing, rest breaks for HR to decrease back down to 110s  Ambulation/Gait                Stairs            Wheelchair Mobility    Modified Rankin (Stroke Patients Only)       Balance Overall balance assessment: Needs assistance Sitting-balance support: Feet supported Sitting balance-Leahy Scale: Fair     Standing balance support: Bilateral upper extremity supported;During functional activity Standing balance-Leahy Scale: Zero                               Pertinent Vitals/Pain Pain Assessment: No/denies pain    Home Living Family/patient expects to be discharged to:: Skilled nursing facility                      Prior Function Level of Independence: Needs assistance   Gait / Transfers Assistance Needed: walking with Rollator prior to last admission, admitted from SNF and pt reports mostly bed to chair     Comments: macular degeneration     Hand Dominance        Extremity/Trunk Assessment   Upper Extremity Assessment Upper Extremity Assessment: Generalized weakness    Lower Extremity Assessment Lower Extremity Assessment: Generalized weakness  Cervical / Trunk Assessment Cervical / Trunk Assessment: Kyphotic  Communication   Communication: HOH  Cognition Arousal/Alertness: Awake/alert Behavior During Therapy: WFL for tasks assessed/performed Overall Cognitive Status: Within Functional Limits for tasks assessed                                 General Comments: pt reports fear of falling      General Comments      Exercises     Assessment/Plan    PT Assessment Patient needs continued PT services  PT Problem List Decreased strength;Decreased mobility;Decreased activity tolerance;Decreased  balance;Decreased knowledge of use of DME       PT Treatment Interventions DME instruction;Therapeutic activities;Gait training;Therapeutic exercise;Patient/family education;Balance training;Stair training;Wheelchair mobility training;Functional mobility training    PT Goals (Current goals can be found in the Care Plan section)  Acute Rehab PT Goals Patient Stated Goal: "be able to walk to the bathroom on my own" PT Goal Formulation: With patient Time For Goal Achievement: 02/16/18 Potential to Achieve Goals: Fair    Frequency Min 3X/week   Barriers to discharge        Co-evaluation               AM-PAC PT "6 Clicks" Daily Activity  Outcome Measure Difficulty turning over in bed (including adjusting bedclothes, sheets and blankets)?: A Lot Difficulty moving from lying on back to sitting on the side of the bed? : Unable Difficulty sitting down on and standing up from a chair with arms (e.g., wheelchair, bedside commode, etc,.)?: Unable Help needed moving to and from a bed to chair (including a wheelchair)?: Total Help needed walking in hospital room?: Total Help needed climbing 3-5 steps with a railing? : Total 6 Click Score: 7    End of Session Equipment Utilized During Treatment: Gait belt Activity Tolerance: Patient limited by fatigue Patient left: in bed;with bed alarm set;with family/visitor present Nurse Communication: Mobility status PT Visit Diagnosis: History of falling (Z91.81);Muscle weakness (generalized) (M62.81);Difficulty in walking, not elsewhere classified (R26.2)    Time: 1428-1500 PT Time Calculation (min) (ACUTE ONLY): 32 min   Charges:   PT Evaluation $PT Eval Moderate Complexity: 1 Mod         Zenovia Jarred, PT, DPT Acute Rehabilitation Services Office: [phone] Pager: [phone]  Sarajane Jews 02/02/2018, 4:17 PM

## 2018-02-02 NOTE — Progress Notes (Addendum)
Progress Note  Patient Name: Krystal Patrick Date of Encounter: 02/02/2018  Primary Cardiologist: Rollene Rotunda, MD   Subjective   Pt feels well today. EGD may be tomorrow  Inpatient Medications    Scheduled Meds: . sodium chloride   Intravenous Once  . capsaicin  1 application Topical Daily  . feeding supplement (ENSURE ENLIVE)  237 mL Oral BID BM  . gabapentin  300 mg Oral Daily  . pantoprazole  40 mg Oral BID AC  . potassium chloride  40 mEq Oral Daily  . sodium chloride flush  10-40 mL Intracatheter Q12H  . sodium chloride flush  3 mL Intravenous Q12H   Continuous Infusions:  PRN Meds: acetaminophen **OR** acetaminophen, albuterol, magnesium hydroxide, Melatonin, metoprolol tartrate, ondansetron **OR** ondansetron (ZOFRAN) IV, RESOURCE THICKENUP CLEAR, sodium chloride flush, traMADol   Vital Signs    Vitals:   02/01/18 0507 02/01/18 1500 02/01/18 2122 02/02/18 0539  BP: 115/65 107/62 100/69 113/74  Pulse: 71 78 86 85  Resp: 16 16 17 18   Temp: 97.8 F (36.6 C) 98.3 F (36.8 C) 97.6 F (36.4 C) 97.7 F (36.5 C)  TempSrc: Oral Oral Oral Oral  SpO2: 96% 99% 94% 92%  Weight:      Height:        Intake/Output Summary (Last 24 hours) at 02/02/2018 0930 Last data filed at 02/02/2018 0538 Gross per 24 hour  Intake -  Output 900 ml  Net -900 ml   Filed Weights   01/26/18 2126  Weight: 57 kg    Telemetry    Generally sinus - Personally Reviewed  ECG    No new tracings - Personally Reviewed  Physical Exam   GEN: No acute distress.   Neck: No JVD Cardiac: irregular rhythm, regular rate, + murmur.  Respiratory: Clear to auscultation bilaterally. GI: Soft, nontender, non-distended  MS: No edema; No deformity. Neuro:  Nonfocal  Psych: Normal affect   Labs    Chemistry Recent Labs  Lab 01/26/18 2211  01/29/18 1154 01/30/18 0647 01/31/18 0530  NA 130*   < > 143 144 142  K 5.4*   < > 3.7 3.8 4.0  CL 94*   < > 112* 110 104  CO2 22   < >  20* 24 29  GLUCOSE 129*   < > 74 93 104*  BUN 35*   < > 23 20 23   CREATININE 0.93   < > 0.81 0.71 0.77  CALCIUM 8.0*   < > 8.0* 8.6* 8.7*  PROT 5.5*  --   --   --   --   ALBUMIN 2.4*  --   --   --   --   AST 25  --   --   --   --   ALT 21  --   --   --   --   ALKPHOS 145*  --   --   --   --   BILITOT 1.5*  --   --   --   --   GFRNONAA 51*   < > >60 >60 >60  GFRAA 59*   < > >60 >60 >60  ANIONGAP 14   < > 11 10 9    < > = values in this interval not displayed.     Hematology Recent Labs  Lab 01/28/18 0311 01/29/18 0003 01/29/18 1154 01/31/18 0530  WBC 23.2*  --  17.7* 10.4  RBC 2.38*  --  4.06 4.40  HGB 7.1* 12.5 11.8* 13.0  HCT 22.1* 37.5 36.2 39.1  MCV 92.9  --  89.2 88.9  MCH 29.8  --  29.1 29.5  MCHC 32.1  --  32.6 33.2  RDW 14.5  --  15.4 15.4  PLT 452*  --  399 308    Cardiac Enzymes Recent Labs  Lab 01/29/18 0003 01/29/18 0334 01/29/18 0812  TROPONINI 3.08* 4.11* 3.51*    Recent Labs  Lab 01/27/18 0057  TROPIPOC 0.06     BNPNo results for input(s): BNP, PROBNP in the last 168 hours.   DDimer No results for input(s): DDIMER in the last 168 hours.   Radiology    Dg Chest Port 1 View  Result Date: 02/01/2018 CLINICAL DATA:  Hypertension, coronary artery disease EXAM: PORTABLE CHEST 1 VIEW COMPARISON:  01/28/2018, 01/27/2018, 01/09/2018 FINDINGS: Aeration is slightly improved. Small right-sided pleural effusion and right basilar airspace disease. Decreased vascular congestion and edema. Stable enlarged cardiomediastinal silhouette with aortic atherosclerosis. Dense mitral calcification. IMPRESSION: 1. Slightly improved aeration with decreased vascular congestion and edema 2. Small right pleural effusion with right basilar airspace disease. Electronically Signed   By: Jasmine Pang M.D.   On: 02/01/2018 14:30    Cardiac Studies   Echo 01/09/18: Study Conclusions - Left ventricle: The cavity size was normal. Wall thickness was   normal. Systolic function  was vigorous. The estimated ejection   fraction was in the range of 65% to 70%. Wall motion was normal;   there were no regional wall motion abnormalities. Doppler   parameters are consistent with abnormal left ventricular   relaxation (grade 1 diastolic dysfunction). - Aortic valve: There was no stenosis. - Mitral valve: Moderately calcified annulus. There was trivial   regurgitation. - Right ventricle: The cavity size was normal. Systolic function   was normal. - Tricuspid valve: Peak RV-RA gradient (S): 60 mm Hg. - Pulmonary arteries: PA peak pressure: 63 mm Hg (S). - Inferior vena cava: The vessel was normal in size. The   respirophasic diameter changes were in the normal range (= 50%),   consistent with normal central venous pressure.  Impressions: - Normal LV size with EF 65-70%. Normal RV size and systolic   function. No significant valvular abnormalities. Moderate   pulmonary hypertension.  Patient Profile     84 y.o. female w/ hx AAA, GERD, reported CAD but no documentation, of this, HTN, was admitted 09/23 w/ SIRS, ?UGIB, hypotension, rapid atrial fib.  She has also had sinus bradycardia observed.  Assessment & Plan    1. Sinus bradycardia - suspected vagal etiology - avoid AV nodal blocking agents - telemetry without bradycardia  2. Persistent atrial fibrillation, PVCs - no anticoagulation secondary to anemia - generally in sinus rhythm  3. Anemia - per primary team - anticipate EGD, ?tomorrow - Hb 13.0  4. Acute respiratory failure, pulmonary HTN, chronic diastolic heart failure - pt was diuresing on 20 mg IV lasix daily - D/C'ed this AM by primary team - question I&Os - on room air    For questions or updates, please contact [facility] Please consult [email or web address] for contact info under     Signed, Marcelino Duster, PA  02/02/2018, 9:30 AM    The patient was seen, examined and discussed with Duane Boston , PA-C and I agree with the above.    A very pleasant 83 yea old female with sinus bradycardia and PAF - short episodes, not on anticoagulation as she was admitted with anemia, I have reviewed her telemetry,  there is no AVB or significant pauses and only short episodes of a-fib lasting less than a minute. She is awaiting EGD for evaluation of anemia. Hb 7.1-->13.0 today. She had troponin elevation in the settings of symptomatic anemia and a-fib with RVR, given her age, anemia, we will not proceed with ischemic work up, also he is chest pain free.  Avoid AVN blocking agents.  She appears euvolemic, no further lasix needed.   Tobias Alexander, MD 02/02/2018

## 2018-02-02 NOTE — Progress Notes (Addendum)
[facility] Progress Note   Chief Complaint:   Dysphagia, abnormal barium swallow and anemia.     SUBJECTIVE:    Feels okay. She denies swallowing problems while eating.  ASSESSMENT AND PLAN:   84 year old female with ? Dysphagia / abnormal esophagram  / anemia.    Admitted  9/24 with SIRS (? Source) and altered mental status.  Hospital course complicated by respiratory failure requiring bipap (possibly volume overload) and rapid Afib.  We saw her for this admission for evaluation of dysphagia /  abnormal esophagus on CT scan as well as anemia in setting of family reports of black stool while in ED.  Patient was not felt to be suitable candidate for EGD until cardiopulmonary condition stabilized.  Cardiology feels patient is now stable for upper endoscopy.  -Difficult to get a true picture of anemia.  Hgb this admission down several grams from baseline ~ 14.  Hgb back up to ~ 13 with just 2 units of blood.  -Will proceed with EGD in am. The risks and benefits of EGD were discussed and the patient agrees to proceed. I will call patient's daughter to see if she has any questions / concerns EGD  ADDENDUM: I called Daughter and discussed EGD. She is comfortable proceeding with the procedure and will be at hospital around 8 am tomorrow   OBJECTIVE:     Vital signs in last 24 hours: Temp:  [97.6 F (36.4 C)-98.3 F (36.8 C)] 97.7 F (36.5 C) (09/30 0539) Pulse Rate:  [78-86] 85 (09/30 0539) Resp:  [16-18] 18 (09/30 0539) BP: (100-113)/(62-74) 113/74 (09/30 0539) SpO2:  [92 %-99 %] 92 % (09/30 0539) Last BM Date: 02/01/18 General:   Alert, female in NAD EENT:  Normal hearing, non icteric sclera, conjunctive pink.  Heart:  irreg rhythm, regular rate. No lower extremity edema Pulm: Normal respiratory effort, lungs CTA bilaterally without wheezes or crackles. Abdomen:  Soft, nondistended, nontender.  Normal bowel sounds, no masses felt.     Neurologic:  Alert and   oriented x4;  grossly normal neurologically. Psych:  Pleasant, cooperative.  Normal mood and affect.   Intake/Output from previous day: 09/29 0701 - 09/30 0700 In: -  Out: 900 [Urine:900] Intake/Output this shift: No intake/output data recorded.  Lab Results: Recent Labs    01/31/18 0530  WBC 10.4  HGB 13.0  HCT 39.1  PLT 308   BMET Recent Labs    01/31/18 0530 02/02/18 0930  NA 142 146*  K 4.0 3.9  CL 104 103  CO2 29 31  GLUCOSE 104* 124*  BUN 23 26*  CREATININE 0.77 0.78  CALCIUM 8.7* 8.6*    Dg Chest Port 1 View  Result Date: 02/01/2018 CLINICAL DATA:  Hypertension, coronary artery disease EXAM: PORTABLE CHEST 1 VIEW COMPARISON:  01/28/2018, 01/27/2018, 01/09/2018 FINDINGS: Aeration is slightly improved. Small right-sided pleural effusion and right basilar airspace disease. Decreased vascular congestion and edema. Stable enlarged cardiomediastinal silhouette with aortic atherosclerosis. Dense mitral calcification. IMPRESSION: 1. Slightly improved aeration with decreased vascular congestion and edema 2. Small right pleural effusion with right basilar airspace disease. Electronically Signed   By: Jasmine Pang M.D.   On: 02/01/2018 14:30     Principal Problem:   SIRS (systemic inflammatory response syndrome) (HCC) Active Problems:   AAA (abdominal aortic aneurysm) without rupture (HCC)   GERD (gastroesophageal reflux disease)   Acute metabolic encephalopathy   Hypotension   Acute blood loss anemia     LOS: 6  days   Willette Cluster ,NP 02/02/2018, 11:35 AM    Attending physician's note   I have taken an interval history, reviewed the chart and examined the patient. I agree with the Advanced Practitioner's note, impression and recommendations.  Plan for EGD tomorrow to further evaluate esophageal thickening on CT scan. The risks and benefits as well as alternatives of endoscopic procedure(s) have been discussed and reviewed. All questions answered. The  patient agrees to proceed. Iona Beard , MD [phone]

## 2018-02-03 ENCOUNTER — Inpatient Hospital Stay (HOSPITAL_COMMUNITY): Payer: Medicare Other | Admitting: Anesthesiology

## 2018-02-03 ENCOUNTER — Encounter (HOSPITAL_COMMUNITY)
Admission: EM | Disposition: A | Discharge status: Skilled Nursing Facility | Payer: Self-pay | Source: Skilled Nursing Facility | Attending: Internal Medicine

## 2018-02-03 ENCOUNTER — Encounter (HOSPITAL_COMMUNITY): Payer: Self-pay | Admitting: *Deleted

## 2018-02-03 HISTORY — PX: BIOPSY: SHX5522

## 2018-02-03 HISTORY — PX: ESOPHAGOGASTRODUODENOSCOPY: SHX5428

## 2018-02-03 LAB — BASIC METABOLIC PANEL
ANION GAP: 12 (ref 5–15)
BUN: 24 mg/dL — ABNORMAL HIGH (ref 8–23)
CHLORIDE: 102 mmol/L (ref 98–111)
CO2: 30 mmol/L (ref 22–32)
Calcium: 8.6 mg/dL — ABNORMAL LOW (ref 8.9–10.3)
Creatinine, Ser: 0.81 mg/dL (ref 0.44–1.00)
GFR calc Af Amer: >60 mL/min (ref >60)
GFR calc non Af Amer: 60 mL/min — ABNORMAL LOW (ref >60)
GLUCOSE: 154 mg/dL — AB (ref 70–99)
POTASSIUM: 4.1 mmol/L (ref 3.5–5.1)
Sodium: 144 mmol/L (ref 135–145)

## 2018-02-03 LAB — CBC
HEMATOCRIT: 42.2 % (ref 36.0–46.0)
HEMOGLOBIN: 13.2 g/dL (ref 12.0–15.0)
MCH: 29.4 pg (ref 26.0–34.0)
MCHC: 31.3 g/dL (ref 30.0–36.0)
MCV: 94 fL (ref 78.0–100.0)
Platelets: 304 10*3/uL (ref 150–400)
RBC: 4.49 MIL/uL (ref 3.87–5.11)
RDW: 15 % (ref 11.5–15.5)
WBC: 11.1 10*3/uL — ABNORMAL HIGH (ref 4.0–10.5)

## 2018-02-03 SURGERY — EGD (ESOPHAGOGASTRODUODENOSCOPY)
Anesthesia: Monitor Anesthesia Care

## 2018-02-03 MED ORDER — LACTATED RINGERS IV SOLN
INTRAVENOUS | Status: DC | PRN
  Administered 2018-02-03: 15:00:00 via INTRAVENOUS

## 2018-02-03 MED ORDER — PROPOFOL 10 MG/ML IV BOLUS
INTRAVENOUS | Status: AC
  Filled 2018-02-03: qty 20

## 2018-02-03 MED ORDER — LIDOCAINE 2% (20 MG/ML) 5 ML SYRINGE
INTRAMUSCULAR | Status: DC | PRN
  Administered 2018-02-03: 50 mg via INTRAVENOUS

## 2018-02-03 MED ORDER — PROPOFOL 10 MG/ML IV BOLUS
INTRAVENOUS | Status: DC | PRN
  Administered 2018-02-03 (×2): 20 mg via INTRAVENOUS
  Administered 2018-02-03: 30 mg via INTRAVENOUS

## 2018-02-03 MED ORDER — FENTANYL CITRATE (PF) 100 MCG/2ML IJ SOLN: 25.0000 ug | INTRAMUSCULAR | Status: DC | PRN

## 2018-02-03 MED ORDER — PHENYLEPHRINE 40 MCG/ML (10ML) SYRINGE FOR IV PUSH (FOR BLOOD PRESSURE SUPPORT)
PREFILLED_SYRINGE | INTRAVENOUS | Status: DC | PRN
  Administered 2018-02-03: 80 ug via INTRAVENOUS

## 2018-02-03 NOTE — Op Note (Signed)
[facility] Patient Name: Krystal Patrick Procedure Date: 02/03/2018 MRN: [id number] Attending MD: Napoleon Form , MD Date of Birth: [birth year] CSN: [id number] Age: 84 Admit Type: Inpatient Procedure:                Upper GI endoscopy Indications:              Recent gastrointestinal bleeding, Abnormal CT of                            the GI tract. Thickening of lower esophagus Providers:                Napoleon Form, MD, Clearnce Sorrel, RN, Zoila Shutter, Technician, [facility], CRNA Referring MD:              Medicines:                Monitored Anesthesia Care Complications:            No immediate complications. Estimated Blood Loss:     Estimated blood loss was minimal. Procedure:                Pre-Anesthesia Assessment:                           - Prior to the procedure, a History and Physical                            was performed, and patient medications and                            allergies were reviewed. The patient's tolerance of                            previous anesthesia was also reviewed. The risks                            and benefits of the procedure and the sedation                            options and risks were discussed with the patient.                            All questions were answered, and informed consent                            was obtained. Prior Anticoagulants: The patient has                            taken no previous anticoagulant or antiplatelet                            agents. ASA Grade Assessment: III - A patient with  severe systemic disease. After reviewing the risks                            and benefits, the patient was deemed in                            satisfactory condition to undergo the procedure.                           After obtaining informed consent, the endoscope was                            passed under direct vision.  Throughout the                            procedure, the patient's blood pressure, pulse, and                            oxygen saturations were monitored continuously. The                            GIF-H190 ([id number]) Olympus adult endoscope was                            introduced through the mouth, and advanced to the                            second part of duodenum. The upper GI endoscopy was                            accomplished without difficulty. The patient                            tolerated the procedure well. Scope In: Scope Out: Findings:      LA Grade D (one or more mucosal breaks involving at least 75% of       esophageal circumference) esophagitis was found 20 to 38 cm from the       incisors. Biopsies were taken with a cold forceps for histology.      A hiatal hernia was present. Biopsies were taken with a cold forceps for       Helicobacter pylori testing.      The exam of the stomach was otherwise normal.      One partially obstructing non-bleeding cratered duodenal ulcer with no       stigmata of bleeding was found in the duodenal bulb. The lesion was 12       mm in largest dimension. There is no evidence of perforation.      One non-bleeding superficial duodenal ulcer with no stigmata of bleeding       was found in the duodenal bulb. The lesion was 2 mm in largest dimension.      The second portion of the duodenum was normal. Impression:               - LA Grade D esophagitis. Biopsied.                           -  Hiatal hernia. Biopsied.                           - One partially obstructing non-bleeding duodenal                            ulcer with no stigmata of bleeding. There is no                            evidence of perforation.                           - One non-bleeding duodenal ulcer with no stigmata                            of bleeding.                           - Normal second portion of the duodenum. Moderate Sedation:      N/A- Per  Anesthesia Care Recommendation:           - Mechanical soft diet.                           - Continue present medications.                           - Use Prilosec (omeprazole) 40 mg PO BID.                           - No high dose aspirin, ibuprofen, naproxen, or                            other non-steroidal anti-inflammatory drugs.                           - Await pathology results and treat if H.pylori                            positive                           - Repeat Hgb in 1-2 weeks                           - OK to discharge home from GI stanpoint.                           - Follow an antireflux regimen indefinitely. This                            includes:                           - Do not lie down for at least 3 to 4 hours after  meals.                           - Raise the head of the bed 4 to 6 inches.                           - Decrease excess weight.                           - Avoid citrus juices and other acidic foods,                            alcohol, chocolate, mints, coffee and other                            caffeinated beverages, carbonated beverages, fatty                            and fried foods.                           - Avoid tight-fitting clothing.                           - Avoid cigarettes and other tobacco products. Procedure Code(s):        --- Professional ---                           [phone], Esophagogastroduodenoscopy, flexible,                            transoral; with biopsy, single or multiple Diagnosis Code(s):        --- Professional ---                           K20.9, Esophagitis, unspecified                           K44.9, Diaphragmatic hernia without obstruction or                            gangrene                           K26.9, Duodenal ulcer, unspecified as acute or                            chronic, without hemorrhage or perforation                           K92.2, Gastrointestinal hemorrhage,  unspecified                           R93.3, Abnormal findings on diagnostic imaging of                            other parts of digestive tract CPT copyright 2017 American  Medical Association. All rights reserved. The codes documented in this report are preliminary and upon coder review may  be revised to meet current compliance requirements. Napoleon Form, MD 02/03/2018 3:19:42 PM This report has been signed electronically. Number of Addenda: 0

## 2018-02-03 NOTE — Anesthesia Preprocedure Evaluation (Addendum)
Anesthesia Evaluation  Patient identified by MRN, date of birth, ID band Patient awake    Reviewed: Allergy & Precautions, NPO status , Patient's Chart, lab work & pertinent test results  History of Anesthesia Complications Negative for: history of anesthetic complications  Airway Mallampati: II  TM Distance: >3 FB Neck ROM: Full    Dental  (+) Poor Dentition, Missing, Dental Advisory Given, Chipped   Pulmonary shortness of breath (off of BiPAP now), former smoker,    breath sounds clear to auscultation       Cardiovascular hypertension, Pt. on medications and Pt. on home beta blockers (-) angina+ CAD and + Peripheral Vascular Disease (AAA)  + dysrhythmias Atrial Fibrillation  Rhythm:Regular Rate:Normal  01/09/18 ECHO: Normal LV size with EF 65-70%. Normal RV size and systolicfunction. No significant valvular abnormalities. Moderate pulmonary hypertension.   Neuro/Psych Depression negative neurological ROS     GI/Hepatic Neg liver ROS, GERD  Controlled,  Endo/Other  negative endocrine ROS  Renal/GU negative Renal ROS     Musculoskeletal  (+) Arthritis , Osteoarthritis,    Abdominal   Peds  Hematology negative hematology ROS (+)   Anesthesia Other Findings   Reproductive/Obstetrics                            Anesthesia Physical Anesthesia Plan  ASA: III  Anesthesia Plan: MAC   Post-op Pain Management:    Induction: Intravenous  PONV Risk Score and Plan: 2 and Treatment may vary due to age or medical condition  Airway Management Planned: Natural Airway and Simple Face Mask  Additional Equipment:   Intra-op Plan:   Post-operative Plan:   Informed Consent: I have reviewed the patients History and Physical, chart, labs and discussed the procedure including the risks, benefits and alternatives for the proposed anesthesia with the patient or authorized representative who has indicated  his/her understanding and acceptance.   Dental advisory given  Plan Discussed with: CRNA and Surgeon  Anesthesia Plan Comments: (Plan routine monitors, MAC)        Anesthesia Quick Evaluation

## 2018-02-03 NOTE — Clinical Social Work Note (Signed)
Clinical Social Work Assessment  Patient Details  Name: Krystal Patrick MRN: [id number] Date of Birth: 26-Apr-1921  Date of referral:  02/02/18               Reason for consult:  Facility Placement                Permission sought to share information with:  Facility Art therapist granted to share information::  Yes, Verbal Permission Granted  Name::     Kristen Cardinal  Agency::  SNF  Relationship::  Daughter  Contact Information:  [phone]  Housing/Transportation Living arrangements for the past 2 months:  Single Family Home, Skilled Nursing Facility(Lives at home with daughter primarily but had been at [facility] for short term rehab.) Source of Information:  Adult Children Patient Interpreter Needed:  None Criminal Activity/Legal Involvement Pertinent to Current Situation/Hospitalization:  No - Comment as needed Significant Relationships:  Adult Children, Other Family Members Lives with:  Adult Children Do you feel safe going back to the place where you live?  Yes Need for family participation in patient care:  Yes (Comment)  Care giving concerns:  Patient lives at home with daughter, Johnny Bridge. She is currently +2 assist and needs extensive assist to transfer.    Social Worker assessment / plan:  CSW spoke with patient, patient's daughter Johnny Bridge), and patient's great-grandson at beside to discuss discharge plan. Patient had been at [facility] receiving short term therapy prior to being readmitted to hospital.   Patient's daughter expressed multiple concerns with patient returning to [facility] and prefers for patient to return home with home health and hospital bed if patient can do transfers while inpatient. Patient's daughter stated if patient were to return to [facility], she would only want it to be for a "very short amount of time." While patient was at [facility], patient had high amounts of pain and could barely participate in rehab. Patient's daughter expressed  that it seemed useless for her to be there because patient was unable to make progress with therapy.  Patient's daughter has concerns about patient potentially being discharged too early. Patient was recently inpatient early September due to fall. Patient's daughter feels patient was discharged too early from that stay. There was also a problem with patient being discharged before Medicare qualifying stay had been met. Patient's daughter expressed frustrations surrounding that, as well.  Patient wants to have in-person conversation with medical team. CSW will alert Dr. Karleen Hampshire.  CSW will ask RNCM to discuss [place] options with patient and family.   CSW will continue to follow in case SNF placement is decided.    Employment status:  Retired Forensic scientist:  Medicare PT Recommendations:  [place] / Referral to community resources:  [place]  Patient/Family's Response to care:  Family is frustrated with care and patient's pain levels while at SNF prior to this admission. Family is appreciative of CSW involvement but generally concerned with discharge too soon and patient's pain levels.  Patient/Family's Understanding of and Emotional Response to Diagnosis, Current Treatment, and Prognosis:  Patient's family seemingly understands discharge options and process involved. They asked appropriate questions and requested CSW reach out to [facility] about [place].  Emotional Assessment Appearance:  Appears stated age Attitude/Demeanor/Rapport:  Unable to Assess Affect (typically observed):  Unable to Assess Orientation:  Oriented to Self Alcohol / Substance use:  Not Applicable Psych involvement (Current and /or in the community):  No (Comment)  Discharge Needs  Concerns to  be addressed:  Care Coordination Readmission within the last 30 days:  Yes Current discharge risk:  Physical Impairment Barriers to Discharge:  Continued Medical Work up   [employer],  [place] 02/03/2018, 12:13 PM

## 2018-02-03 NOTE — Progress Notes (Signed)
Nutrition Follow-up  INTERVENTION:   Continue Ensure Enlive po BID, each supplement provides 350 kcal and 20 grams of protein  NUTRITION DIAGNOSIS:   Inadequate oral intake related to inability to eat as evidenced by NPO status.  Now NPO for EGD.  GOAL:   Patient will meet greater than or equal to 90% of their needs  Not meeting.  MONITOR:   PO intake, Supplement acceptance, Labs, Weight trends, I & O's   ASSESSMENT:   84 y.o. female with medical history significant of HTN, CAD, AAA, and depression. She presented to the ED with AMS. History was obtained from the patient's grandson as patient was unable to provide her own history. Patient recently hospitalized from 9/5-9/9, after having a fall with associated low back and sciatica pain. Patient had been sent to [facility] after discharge. She had elevated WBC count on recent lab work. Grandson noted that around noon on 9/22 she was mildly confused which is not abnormal for her but by 5 PM she was completely altered. Patient was not making sense with what she was saying, talking to people that are not there, and slurring her speech. While at [facility] patient reportedly had a poor appetite, poor oral intake, and new cough over the past week. Family suspected that the patient was dehydrated and/or possibly had a urinary tract infection.  Patient currently NPO for EGD. SLP performed MBS and  recommended on 9/27, dysphagia 3 diet (ground meats) with thin liquids. Prior to diet change, pt consuming very little. Recommend continue supplements once diet advanced.   No new weight has been recorded since admission 9/23.  Medications: K-DUR tablet daily Labs reviewed: Elevated Na  Diet Order:   Diet Order            Diet NPO time specified  Diet effective now              EDUCATION NEEDS:   Not appropriate for education at this time  Skin:  Skin Assessment: Reviewed RN Assessment  Last BM:  9/29  Height:   Ht Readings from Last 1  Encounters:  01/27/18 5' (1.524 m)    Weight:   Wt Readings from Last 1 Encounters:  01/26/18 57 kg    Ideal Body Weight:  45.45 kg  BMI:  Body mass index is 24.54 kg/m.  Estimated Nutritional Needs:   Kcal:  1610-9604  Protein:  57-68 grams  Fluid:  >/= 1.6 L/day  Tilda Franco, MS, RD, LDN [facility] Inpatient Clinical Dietitian Pager: [phone] After Hours Pager: [phone]

## 2018-02-03 NOTE — Anesthesia Postprocedure Evaluation (Signed)
Anesthesia Post Note  Patient: Krystal Patrick  Procedure(s) Performed: ESOPHAGOGASTRODUODENOSCOPY (EGD) (N/A ) BIOPSY     Patient location during evaluation: Endoscopy Anesthesia Type: MAC Level of consciousness: awake and alert, oriented and patient cooperative Pain management: pain level controlled Vital Signs Assessment: post-procedure vital signs reviewed and stable Respiratory status: spontaneous breathing, nonlabored ventilation, respiratory function stable and patient connected to nasal cannula oxygen Cardiovascular status: blood pressure returned to baseline and stable Postop Assessment: no apparent nausea or vomiting Anesthetic complications: no    Last Vitals:  Vitals:   02/03/18 1525 02/03/18 1531  BP: (!) 109/57 124/63  Pulse: 92 86  Resp: 19 (!) 24  Temp:    SpO2: 95% 92%    Last Pain:  Vitals:   02/03/18 1531  TempSrc:   PainSc: 0-No pain                 [name],E. [place]

## 2018-02-03 NOTE — Care Management Note (Signed)
Case Management Note  Patient Details  Name: Karolyne Timmons MRN: [id number] Date of Birth: 08-Feb-1921  Subjective/Objective:                  Discharge Planning  Action/Plan: Ansered question from the family about hhc versus snf and the difference that hhc will only be out to see patient three times a week at the most.  Daughter wants p.t. Done and to get patient to where she can transfer from the bed to a chair.  Daughter has just had a hip replacement herself. Lucila Maine is here temporarily from [place].  Suggest that she gather all the information that she can concerning the patient level of strength and level activities before making a decision as to home versus snf.  If the patient goes to a snf they do want her to go back to [facility] in [place].  Left my name and number with family for any further questions.   Expected Discharge Date:                  Expected Discharge Plan:     In-House Referral:     Discharge planning Services     Post Acute Care Choice:    Choice offered to:     DME Arranged:    DME Agency:     HH Arranged:    HH Agency:     Status of Service:     If discussed at [employer], dates discussed:    Additional Comments:  Golda Acre, RN 02/03/2018, 1:20 PM

## 2018-02-03 NOTE — Progress Notes (Signed)
SLP Cancellation Note  Patient Details Name: Krystal Patrick MRN: [id number] DOB: 1920-09-07   Cancelled treatment:       Reason Eval/Treat Not Completed: Medical issues which prohibited therapy(pt npo for procedure, will continue efforts)   Chales Abrahams 02/03/2018, 7:16 AM  Donavan Burnet, MS [facility] SLP Acute Rehab Services Pager [phone] Office [phone]

## 2018-02-03 NOTE — Interval H&P Note (Signed)
History and Physical Interval Note:  02/03/2018 2:20 PM  Krystal Patrick  has presented today for surgery, with the diagnosis of dysphagia, abnormal barium swallow, anemia with black stools  The various methods of treatment have been discussed with the patient and family. After consideration of risks, benefits and other options for treatment, the patient has consented to  Procedure(s): ESOPHAGOGASTRODUODENOSCOPY (EGD) (N/A) as a surgical intervention .  The patient's history has been reviewed, patient examined, no change in status, stable for surgery.  I have reviewed the patient's chart and labs.  Questions were answered to the patient's satisfaction.     Hubert Raatz

## 2018-02-03 NOTE — Transfer of Care (Signed)
Immediate Anesthesia Transfer of Care Note  Patient: Krystal Patrick  Procedure(s) Performed: ESOPHAGOGASTRODUODENOSCOPY (EGD) (N/A ) BIOPSY  Patient Location: PACU  Anesthesia Type:MAC  Level of Consciousness: awake, alert  and oriented  Airway & Oxygen Therapy: Patient Spontanous Breathing and Patient connected to nasal cannula oxygen  Post-op Assessment: Report given to RN and Post -op Vital signs reviewed and stable  Post vital signs: Reviewed and stable  Last Vitals:  Vitals Value Taken Time  BP    Temp    Pulse    Resp    SpO2      Last Pain:  Vitals:   02/03/18 1356  TempSrc: Oral  PainSc: Asleep      Patients Stated Pain Goal: 2 (88/50/27 7412)  Complications: No apparent anesthesia complications

## 2018-02-03 NOTE — Progress Notes (Signed)
15 run v-tach at 1915, (2) pauses at 0200(2 sec)/0400(4sec) asymptomatic On call notified no new orders noted

## 2018-02-03 NOTE — Care Management Important Message (Signed)
Important Message  Patient Details  Name: Yakelin Grenier MRN: [id number] Date of Birth: [birth year]   Medicare Important Message Given:  Yes    Caren Macadam 02/03/2018, 12:24 PMImportant Message  Patient Details  Name: Lucylle Foulkes MRN: [id number] Date of Birth: 1921/03/06   Medicare Important Message Given:  Yes    Caren Macadam 02/03/2018, 12:24 PM

## 2018-02-03 NOTE — Progress Notes (Signed)
PROGRESS NOTE    Krystal Patrick  ZOX:[id number] DOB: [birth year] DOA: 01/26/2018 PCP: Kaleen Mask, MD    Brief Narrative:   Krystal Patrick is a 84 y.o. female with medical history significant of HTN, CAD, AAA, and depression; who presents with altered mental status.   Patient just recently been hospitalized from 9/5-9/9, after having a fall with complaints of low back pain and sciatica.  Patient had been sent to [facility] where she was nearing discharge. On admission, pt was afebrile, tachycardic, hypotensive, elevated wbc count,  UA, and CXR negative. CT abd and pelvis showing 3.5 infra renal AAA aneurysm and esophagitis.  Her stool for occult blood was positive and her hemoglobin dropped from 9 to 7.1 this admission. She was transferred to step down foro acute respiratory failure with hypoxia sec to acute diastolic heart failure.    Assessment & Plan:   Principal Problem:   SIRS (systemic inflammatory response syndrome) (HCC) Active Problems:   AAA (abdominal aortic aneurysm) without rupture (HCC)   GERD (gastroesophageal reflux disease)   Acute metabolic encephalopathy   Hypotension   Acute blood loss anemia   Abnormal CT scan, gastrointestinal tract    Acute respiratory failure with hypoxia: improved.  Suspect secondary to fluid overload, mild acute on chronic diastolic heart failure.  She received about 4.8 liters of fluid on admission. Transferred the patient to stepdown for possible BIPAP and closer monitoring. CXR showed CHF, with vascular congestion. She was started on IV lasix and diuresed about 4 lit since admission.  Cardiology consulted , appreciate recommendations.   lasix discontinued as per cardiology recommendations.   Atrial fibrillation with RVR:  Possibly from CHF and demand ischemia from acute anemia. Rate controlled this am.  Elevated troponins possibly from demand ischemia from CHF. Pt has bradycardia, so we will discontinue  metoprolol.   GI bleed/ acute anemia of blood loss:  Patient's baseline hemoglobin around 14, dropped to 7.1 .  Stool for occult blood is positive. CT abd and pelvis showed esophagitis and Possible diverticula of the medial wall of the second portion of the duodenum and Extensive sigmoid diverticulosis. 2 units of prbc transfusion ordered. Post transfusion lasix IV 20 mg  given.  GI consulted from [facility] for possible endoscopy, underwent EGd showing  - LA Grade D esophagitis. Biopsied. - Hiatal hernia. Biopsied. - One partially obstructing non-bleeding duodenal ulcer with no stigmata of bleeding. There is no evidence of perforation. - One non-bleeding duodenal ulcer with no stigmata of bleeding. - Normal second portion of the duodenum. - recommendations to continue with PPI BID.  - follow up biopsy report and monitor H&H.  H&H around 13., repeat in am.  No obvious bleeding per rectum.   Acute metabolic encephalopathy: suspect from the gabapentin that was recently started at [facility] for sciatica.  Initial CT head negative.  - decreased the frequency TID to daily dose of gabapentin.  - pain control with physical therapy.  - pt appears to be back to baseline, she Is alert and answering questions appropriately.  - no changes in meds.   Nutrition:  SLP evaluated, recommended dysphagia 3 diet.   SIRS on admission:  She was empirically started on IV antibiotics, but no source of infection found. Discontinued abx and monitor.    Leukocytosis:  Suspect reactive. Cbc shows normal wbc count.     DVT prophylaxis: scd's Code Status: Full code.  Family Communication:  Family at bedside. Had extensive discussions with family.  Disposition Plan: SNF on  discharge. Possibly after GI work up.   Consultants:   Gastroenterology Corinda Gubler.   Cardiology.   Procedures:  Echo.  EGD- LA Grade D esophagitis. Biopsied. - Hiatal hernia. Biopsied. - One partially obstructing non-bleeding duodenal  ulcer with no stigmata of bleeding. There is no evidence of perforation. - One non-bleeding duodenal ulcer with no stigmata of bleeding. - Normal second portion of the duodenum.  Antimicrobials: None.   Subjective:  no chest pain or sob.  No nausea, vomiting or abdominal pain.  No back pain.   Objective: Vitals:   02/02/18 2009 02/03/18 0435 02/03/18 1356 02/03/18 1519  BP: 108/66 129/72 (!) 168/52 (!) 74/43  Pulse: 82 72 85 97  Resp: (!) 25 20 (!) 23 (!) 26  Temp: 98 F (36.7 C) 97.9 F (36.6 C) 98.2 F (36.8 C) 98.8 F (37.1 C)  TempSrc: Oral Oral Oral Axillary  SpO2: 93% 94% 91% 100%  Weight:      Height:        Intake/Output Summary (Last 24 hours) at 02/03/2018 1523 Last data filed at 02/03/2018 1516 Gross per 24 hour  Intake 260 ml  Output -  Net 260 ml   Filed Weights   01/26/18 2126  Weight: 57 kg    Examination:  General exam: comfortable. On RA.  Respiratory system: air entry fair, no wheezing or rhonchi.  Cardiovascular system: S1 & S2 heard, irregular, No JVD, Gastrointestinal system: Abdomen is soft, non tender non distended bowel sounds  Central nervous system: alert and oriented to place and person.  Extremities: no cyanosis or clubbing, edema in the upper extremities improving.  Skin: No rashes, lesions or ulcers Psychiatry: calm and comfortable.     Data Reviewed: I have personally reviewed following labs and imaging studies  CBC: Recent Labs  Lab 01/28/18 0311 01/29/18 0003 01/29/18 1154 01/31/18 0530  WBC 23.2*  --  17.7* 10.4  HGB 7.1* 12.5 11.8* 13.0  HCT 22.1* 37.5 36.2 39.1  MCV 92.9  --  89.2 88.9  PLT 452*  --  399 308   Basic Metabolic Panel: Recent Labs  Lab 01/28/18 0311 01/29/18 1154 01/30/18 0647 01/31/18 0530 02/02/18 0930  NA 138 143 144 142 146*  K 4.5 3.7 3.8 4.0 3.9  CL 111 112* 110 104 103  CO2 22 20* 24 29 31   GLUCOSE 109* 74 93 104* 124*  BUN 31* 23 20 23  26*  CREATININE 0.64 0.81 0.71 0.77 0.78    CALCIUM 7.2* 8.0* 8.6* 8.7* 8.6*   GFR: Estimated Creatinine Clearance: 33.3 mL/min (by C-G formula based on SCr of 0.78 mg/dL). Liver Function Tests: No results for input(s): AST, ALT, ALKPHOS, BILITOT, PROT, ALBUMIN in the last 168 hours. No results for input(s): LIPASE, AMYLASE in the last 168 hours. No results for input(s): AMMONIA in the last 168 hours. Coagulation Profile: No results for input(s): INR, PROTIME in the last 168 hours. Cardiac Enzymes: Recent Labs  Lab 01/29/18 0003 01/29/18 0334 01/29/18 0812  TROPONINI 3.08* 4.11* 3.51*   BNP (last 3 results) No results for input(s): PROBNP in the last 8760 hours. HbA1C: No results for input(s): HGBA1C in the last 72 hours. CBG: No results for input(s): GLUCAP in the last 168 hours. Lipid Profile: No results for input(s): CHOL, HDL, LDLCALC, TRIG, CHOLHDL, LDLDIRECT in the last 72 hours. Thyroid Function Tests: No results for input(s): TSH, T4TOTAL, FREET4, T3FREE, THYROIDAB in the last 72 hours. Anemia Panel: No results for input(s): VITAMINB12, FOLATE, FERRITIN, TIBC, IRON,  RETICCTPCT in the last 72 hours. Sepsis Labs: Recent Labs  Lab 01/29/18 0003 01/29/18 0334  LATICACIDVEN 2.4* 1.3    Recent Results (from the past 240 hour(s))  Blood Culture (routine x 2)     Status: None   Collection Time: 01/27/18 12:44 AM  Result Value Ref Range Status   Specimen Description   Final    BLOOD RIGHT UPPER ARM Performed at [facility], [address] [place], [place] [address]    Special Requests   Final    BOTTLES DRAWN AEROBIC AND ANAEROBIC Blood Culture adequate volume Performed at [facility], [address] [place], [place] [address]    Culture   Final    NO GROWTH 5 DAYS Performed at [facility], [address] [place], [place] [address]    Report Status 02/01/2018 FINAL  Final  MRSA PCR Screening     Status: None   Collection Time: 01/27/18 11:07 AM  Result Value Ref  Range Status   MRSA by PCR NEGATIVE NEGATIVE Final    Comment:        The GeneXpert MRSA Assay (FDA approved for NASAL specimens only), is one component of a comprehensive MRSA colonization surveillance program. It is not intended to diagnose MRSA infection nor to guide or monitor treatment for MRSA infections. Performed at [facility], [address] [place], [place] [address]   Culture, Urine     Status: None   Collection Time: 01/27/18 11:26 AM  Result Value Ref Range Status   Specimen Description   Final    Urine Performed at [facility], [address] [place], [place] [address]    Special Requests   Final    NONE Performed at [facility], [address] [place], [place] [address]    Culture   Final    NO GROWTH Performed at [facility], [address], [place], [place] [address]    Report Status 01/28/2018 FINAL  Final  Blood Culture (routine x 2)     Status: None   Collection Time: 01/27/18  2:14 PM  Result Value Ref Range Status   Specimen Description   Final    BLOOD LEFT HAND Performed at [facility], [address] [place], [place] [address]    Special Requests   Final    BOTTLES DRAWN AEROBIC ONLY Blood Culture adequate volume Performed at [facility], [address] [place], [place] [address]    Culture   Final    NO GROWTH 5 DAYS Performed at [facility], [address], [place], [place] [address]    Report Status 02/01/2018 FINAL  Final         Radiology Studies: No results found.      Scheduled Meds: . [MAR Hold] sodium chloride   Intravenous Once  . [MAR Hold] capsaicin  1 application Topical Daily  . [MAR Hold] feeding supplement (ENSURE ENLIVE)  237 mL Oral BID BM  . [MAR Hold] gabapentin  300 mg Oral Daily  . [MAR Hold] pantoprazole  40 mg Oral BID AC  . [MAR Hold] potassium chloride  40 mEq Oral Daily  . [MAR  Hold] sodium chloride flush  10-40 mL Intracatheter Q12H  . [MAR Hold] sodium chloride flush  3 mL Intravenous Q12H   Continuous Infusions:    LOS: 7 days    Time spent: 35 minutes.  Kathlen Mody, MD [facility] Pager [phone]  If 7PM-7AM, please contact night-coverage [email or web address] Password [email or web address] 02/03/2018, 3:23 PM

## 2018-02-04 DIAGNOSIS — R933 Abnormal findings on diagnostic imaging of other parts of digestive tract: Secondary | ICD-10-CM

## 2018-02-04 MED ORDER — TRAMADOL HCL 50 MG PO TABS
50.0000 mg | ORAL_TABLET | Freq: Once | ORAL | Status: AC
  Administered 2018-02-05: 50 mg via ORAL
  Filled 2018-02-04: qty 1

## 2018-02-04 NOTE — Progress Notes (Signed)
Physical Therapy Treatment Patient Details Name: Krystal Patrick MRN: [id number] DOB: August 08, 1920 Today's Date: 02/04/2018    History of Present Illness 84 y.o. female with medical history significant of HTN, CAD, AAA, and depression; who presents with altered mental status and admitted 09/23 w/ SIRS, ?UGIB, hypotension, rapid atrial fib.  Patient just recently been hospitalized from 9/5-9/9, after having a fall with complaints of low back pain and sciatica.  Patient had been sent to [facility]     PT Comments    Pt participated fairly well. She is weak and fatigues easily with minimal activity. HR up to 160 bpm with activity. Recommend return to SNF, however pt stated, during session, that she is planning to go home instead.    Follow Up Recommendations  SNF(HHPT, 24 hour care if pt decides to d/c home instead)     Equipment Recommendations  None recommended by PT    Recommendations for Other Services       Precautions / Restrictions Precautions Precautions: Fall Precaution Comments: pt has a fear of falling Restrictions Weight Bearing Restrictions: No    Mobility  Bed Mobility Overal bed mobility: Needs Assistance Bed Mobility: Supine to Sit     Supine to sit: Mod assist;HOB elevated     General bed mobility comments: Assist for LEs and trunk. Cues for hand placement, technique. Utilized bedpad to aid with scooting, positioning.   Transfers Overall transfer level: Needs assistance   Transfers: Sit to/from Stand Sit to Stand: Mod assist;Max assist         General transfer comment: Mod Assist from STEDY seat. Max assist from elevated bed. Multimodal cueing required (hand placement, knee/trunk extension). Pt stood x 2 for ~10-15 seconds before fatiguing. HR up to 160 bpm per RN.   Ambulation/Gait                 Stairs             Wheelchair Mobility    Modified Rankin (Stroke Patients Only)       Balance Overall balance  assessment: Needs assistance         Standing balance support: Bilateral upper extremity supported Standing balance-Leahy Scale: Poor                              Cognition Arousal/Alertness: Awake/alert Behavior During Therapy: WFL for tasks assessed/performed Overall Cognitive Status: Within Functional Limits for tasks assessed                                        Exercises General Exercises - Lower Extremity Ankle Circles/Pumps: AROM;Both;10 reps;Supine Quad Sets: AROM;Both;10 reps;Supine Long Arc Quad: AROM;Both;10 reps;Seated Heel Slides: AROM;Both;10 reps;Supine Hip ABduction/ADduction: AROM;Both;10 reps;Supine    General Comments        Pertinent Vitals/Pain Pain Assessment: No/denies pain    Home Living                      Prior Function            PT Goals (current goals can now be found in the care plan section) Progress towards PT goals: Progressing toward goals    Frequency    Min 3X/week      PT Plan Current plan remains appropriate    Co-evaluation  AM-PAC PT "6 Clicks" Daily Activity  Outcome Measure  Difficulty turning over in bed (including adjusting bedclothes, sheets and blankets)?: A Lot Difficulty moving from lying on back to sitting on the side of the bed? : Unable Difficulty sitting down on and standing up from a chair with arms (e.g., wheelchair, bedside commode, etc,.)?: Unable Help needed moving to and from a bed to chair (including a wheelchair)?: Total Help needed walking in hospital room?: Total Help needed climbing 3-5 steps with a railing? : Total 6 Click Score: 7    End of Session   Activity Tolerance: Patient limited by fatigue Patient left: in chair;with call bell/phone within reach;with chair alarm set   PT Visit Diagnosis: History of falling (Z91.81);Muscle weakness (generalized) (M62.81);Difficulty in walking, not elsewhere classified (R26.2)     Time:  1610-9604 PT Time Calculation (min) (ACUTE ONLY): 23 min  Charges:  $Therapeutic Activity: 23-37 mins             Rebeca Alert, PT Acute Rehabilitation Services Pager: [phone] Office: [phone]

## 2018-02-04 NOTE — Progress Notes (Signed)
  Speech Language Pathology Treatment: Dysphagia  Patient Details Name: Krystal Patrick MRN: [id number] DOB: 1920/11/19 Today's Date: 02/04/2018 Time: 7846-9629 SLP Time Calculation (min) (ACUTE ONLY): 35 min  Assessment / Plan / Recommendation Clinical Impression  Pt seen for assessment of diet tolerance and education. Safe swallow precautions posted at [facility]. Pt independently recalled need for chin tuck. SLP provided education regarding use of unbent straw to facilitate chin tuck position. This information was passed along to pt's great grandson upon his arrival. No overt s/s aspiration observed with trials of thin liquids or dys 3 solids. ST will continue to follow for diet tolerance and education.    HPI HPI: Patient is a 84 yr old female admit with increased cough/congestion from SNF.  Recent admit 01/08/2018 with diagnosis of near syncopal episode and fall at home 5 days prior to admission.  PMH: AAA, HTN, CAD, GERD, depression.  Swallow evaluation ordered.  Family present and reports pt had increased cough over the last few weeks and reported problems swallowing stating it was difficult to get food down and effortful swallow with poor intake.  CXR negative yesterday.  Imaging study showed esophagitis with rec for endoscopy after acute esoph issues resolve.  Pt with respiratory issues during hospital admit and thus required transfer to ICU and Bipap usage - ? fluid overload?  MBS completed today and purpose of visit was to educated daughter and pt and reinforce effective compensation strategies.        SLP Plan  Continue with current plan of care       Recommendations  Diet recommendations: Dysphagia 3 (mechanical soft);Thin liquid Liquids provided via: Straw Medication Administration: Crushed with puree Supervision: Staff to assist with self feeding;Full supervision/cueing for compensatory strategies;Patient able to self feed Compensations: Slow rate;Small sips/bites;Follow solids  with liquid;Other (Comment);Minimize environmental distractions(clear throat and dry swallow) Postural Changes and/or Swallow Maneuvers: Seated upright 90 degrees;Upright 30-60 min after meal                Oral Care Recommendations: Oral care QID Follow up Recommendations: Skilled Nursing facility SLP Visit Diagnosis: Dysphagia, pharyngoesophageal phase (R13.14) Plan: Continue with current plan of care       GO              Zenon Leaf B. Murvin Natal [facility], CCC-SLP Speech Language Pathologist [phone]  Krystal Patrick 02/04/2018, 12:58 PM

## 2018-02-04 NOTE — Progress Notes (Signed)
PROGRESS NOTE    Krystal Patrick  ZOX:[id number] DOB: 02-20-1921 DOA: 01/26/2018 PCP: Kaleen Mask, MD   Brief Narrative: Krystal Patrick is a 84 y.o. femalewith medical history significant ofHTN, CAD, AAA, and depression. She presented with altered mental status likely secondary to gabapentin. SIRS on admission but infection ruled out. Hospitalization complicated by acute heart failure and atrial fibrillation with RVR in addition to bradycardia.   Assessment & Plan:   Principal Problem:   SIRS (systemic inflammatory response syndrome) (HCC) Active Problems:   AAA (abdominal aortic aneurysm) without rupture (HCC)   GERD (gastroesophageal reflux disease)   Acute metabolic encephalopathy   Hypotension   Acute blood loss anemia   Abnormal CT scan, gastrointestinal tract   Acute respiratory failure with hypoxia Secondary to fluid overload from acute heart failure. Resolved with diuresis.  Atrial fibrillation with RVR Thought secondary to heart failure/demand ischemia. Cardiology consulted earlier. Seems to have converted to sinus rhythm. Patient with episodes of sinus tachycardia still. Combined with multiple episodes of sinus bradycardia with ~2 second pauses -Re-consult cardiology  Bradycardia Now with ~2 second pauses. Seen by EP who did not feel she was a candidate for pacemaker. -Re-consult cardiology  Acute blood loss anemia GI bleeding Patient with esophagitis and diverticulosis. She received 2 units of PRBC with significant improvement of hemoglobin which is now at baseline. She had an EGD which was significant for LA Grade D esophagitis in addition to a hiatal hernia and multiple non-bleeding ulcers. Biopsies pending -Continue PPI  Acute metabolic encephalopathy Thought to be secondary to gabapentin. Dosing changed.  Nutrition Dysphagia 3 diet  SIRS No source. IV antibiotics empirically which have been discontinued.   DVT prophylaxis: SCDs Code  Status:   Code Status: Full Code Family Communication: Daughter at bedside Disposition Plan: Discharge home vs SNF likely in 24 hours depending on cardiology recommendations   Consultants:   Cardiology  Gastroenterology  Procedures:   10/1: EGD Impression:       - LA Grade D esophagitis. Biopsied.                           - Hiatal hernia. Biopsied.                           - One partially obstructing non-bleeding duodenal                            ulcer with no stigmata of bleeding. There is no                            evidence of perforation.                           - One non-bleeding duodenal ulcer with no stigmata                            of bleeding.                           - Normal second portion of the duodenum.  Recommendation:    - Mechanical soft diet.                           -  Continue present medications.                           - Use Prilosec (omeprazole) 40 mg PO BID.                           - No high dose aspirin, ibuprofen, naproxen, or                            other non-steroidal anti-inflammatory drugs.                           - Await pathology results and treat if H.pylori                            positive                           - Repeat Hgb in 1-2 weeks                           - OK to discharge home from GI stanpoint.                           - Follow an antireflux regimen indefinitely. This                            includes:                           - Do not lie down for at least 3 to 4 hours after                            meals.                           - Raise the head of the bed 4 to 6 inches.                           - Decrease excess weight.                           - Avoid citrus juices and other acidic foods,                            alcohol, chocolate, mints, coffee and other                            caffeinated beverages, carbonated beverages, fatty                            and fried foods.                            - Avoid tight-fitting clothing.                           -  Avoid cigarettes and other tobacco   Antimicrobials:  Vancomycin (9/23>>9/24)  Ceftriaxone (9/23)  Cefepime (9/24>>9/25)  Aztreonam (9/23>>9/24)    Subjective: Didn't sleep well overnight.  Objective: Vitals:   02/04/18 0452 02/04/18 1247 02/04/18 1250 02/04/18 1346  BP: (!) 156/78  (!) 121/55 110/62  Pulse: 83  83 83  Resp: 17   20  Temp: (!) 97.3 F (36.3 C)   97.9 F (36.6 C)  TempSrc:    Oral  SpO2: 94% 92% 96% (!) 72%  Weight:      Height:       No intake or output data in the 24 hours ending 02/04/18 1556 Filed Weights   01/26/18 2126  Weight: 57 kg    Examination:  General exam: Appears calm and comfortable Respiratory system: Clear to auscultation. Respiratory effort normal. Cardiovascular system: S1 & S2 heard, RRR. No murmurs, rubs, gallops or clicks. Gastrointestinal system: Abdomen is nondistended, soft and nontender. Normal bowel sounds heard. Central nervous system: Alert and oriented to person and place. No focal neurological deficits. Extremities: No edema. No calf tenderness Skin: No cyanosis. No rashes Psychiatry: Judgement and insight appear normal. Mood & affect appropriate.     Data Reviewed: I have personally reviewed following labs and imaging studies  CBC: Recent Labs  Lab 01/29/18 0003 01/29/18 1154 01/31/18 0530 02/03/18 1618  WBC  --  17.7* 10.4 11.1*  HGB 12.5 11.8* 13.0 13.2  HCT 37.5 36.2 39.1 42.2  MCV  --  89.2 88.9 94.0  PLT  --  399 308 304   Basic Metabolic Panel: Recent Labs  Lab 01/29/18 1154 01/30/18 0647 01/31/18 0530 02/02/18 0930 02/03/18 1618  NA 143 144 142 146* 144  K 3.7 3.8 4.0 3.9 4.1  CL 112* 110 104 103 102  CO2 20* 24 29 31 30   GLUCOSE 74 93 104* 124* 154*  BUN 23 20 23  26* 24*  CREATININE 0.81 0.71 0.77 0.78 0.81  CALCIUM 8.0* 8.6* 8.7* 8.6* 8.6*   GFR: Estimated Creatinine Clearance: 32.9 mL/min (by C-G formula  based on SCr of 0.81 mg/dL). Liver Function Tests: No results for input(s): AST, ALT, ALKPHOS, BILITOT, PROT, ALBUMIN in the last 168 hours. No results for input(s): LIPASE, AMYLASE in the last 168 hours. No results for input(s): AMMONIA in the last 168 hours. Coagulation Profile: No results for input(s): INR, PROTIME in the last 168 hours. Cardiac Enzymes: Recent Labs  Lab 01/29/18 0003 01/29/18 0334 01/29/18 0812  TROPONINI 3.08* 4.11* 3.51*   BNP (last 3 results) No results for input(s): PROBNP in the last 8760 hours. HbA1C: No results for input(s): HGBA1C in the last 72 hours. CBG: No results for input(s): GLUCAP in the last 168 hours. Lipid Profile: No results for input(s): CHOL, HDL, LDLCALC, TRIG, CHOLHDL, LDLDIRECT in the last 72 hours. Thyroid Function Tests: No results for input(s): TSH, T4TOTAL, FREET4, T3FREE, THYROIDAB in the last 72 hours. Anemia Panel: No results for input(s): VITAMINB12, FOLATE, FERRITIN, TIBC, IRON, RETICCTPCT in the last 72 hours. Sepsis Labs: Recent Labs  Lab 01/29/18 0003 01/29/18 0334  LATICACIDVEN 2.4* 1.3    Recent Results (from the past 240 hour(s))  Blood Culture (routine x 2)     Status: None   Collection Time: 01/27/18 12:44 AM  Result Value Ref Range Status   Specimen Description   Final    BLOOD RIGHT UPPER ARM Performed at [facility], [address] [place], [place] [address]    Special Requests  Final    BOTTLES DRAWN AEROBIC AND ANAEROBIC Blood Culture adequate volume Performed at [facility], [address] [place], [place] [address]    Culture   Final    NO GROWTH 5 DAYS Performed at [facility], [address], [place], [place] [address]    Report Status 02/01/2018 FINAL  Final  MRSA PCR Screening     Status: None   Collection Time: 01/27/18 11:07 AM  Result Value Ref Range Status   MRSA by PCR NEGATIVE NEGATIVE Final    Comment:        The GeneXpert MRSA Assay  (FDA approved for NASAL specimens only), is one component of a comprehensive MRSA colonization surveillance program. It is not intended to diagnose MRSA infection nor to guide or monitor treatment for MRSA infections. Performed at [facility], [address] [place], [place] [address]   Culture, Urine     Status: None   Collection Time: 01/27/18 11:26 AM  Result Value Ref Range Status   Specimen Description   Final    Urine Performed at [facility], [address], [place], [place] [address]    Special Requests   Final    NONE Performed at [facility], [address] [place], [place] [address]    Culture   Final    NO GROWTH Performed at [facility], [address], [place], [place] [address]    Report Status 01/28/2018 FINAL  Final  Blood Culture (routine x 2)     Status: None   Collection Time: 01/27/18  2:14 PM  Result Value Ref Range Status   Specimen Description   Final    BLOOD LEFT HAND Performed at [facility], [address], [place], [place] [address]    Special Requests   Final    BOTTLES DRAWN AEROBIC ONLY Blood Culture adequate volume Performed at [facility], [address], [place], [place] [address]    Culture   Final    NO GROWTH 5 DAYS Performed at [facility], [address] [place], [place] [address]    Report Status 02/01/2018 FINAL  Final         Radiology Studies: No results found.      Scheduled Meds: . sodium chloride   Intravenous Once  . capsaicin  1 application Topical Daily  . feeding supplement (ENSURE ENLIVE)  237 mL Oral BID BM  . gabapentin  300 mg Oral Daily  . pantoprazole  40 mg Oral BID AC  . potassium chloride  40 mEq Oral Daily  . sodium chloride flush  10-40 mL Intracatheter Q12H  . sodium chloride flush  3 mL Intravenous Q12H   Continuous Infusions:   LOS: 8 days     Jacquelin Hawking, MD [facility] 02/04/2018, 3:56 PM Pager: [phone]  If 7PM-7AM, please contact night-coverage [email or web address] 02/04/2018, 3:56 PM

## 2018-02-05 ENCOUNTER — Encounter (HOSPITAL_COMMUNITY): Payer: Self-pay | Admitting: Gastroenterology

## 2018-02-05 DIAGNOSIS — I471 Supraventricular tachycardia: Secondary | ICD-10-CM

## 2018-02-05 MED ORDER — FUROSEMIDE 10 MG/ML IJ SOLN
20.0000 mg | Freq: Every day | INTRAMUSCULAR | Status: DC
  Administered 2018-02-05: 20 mg via INTRAVENOUS
  Filled 2018-02-05: qty 2

## 2018-02-05 MED ORDER — HYDROCORTISONE ACETATE 25 MG RE SUPP
25.0000 mg | Freq: Two times a day (BID) | RECTAL | Status: DC | PRN
  Filled 2018-02-05: qty 1

## 2018-02-05 NOTE — Progress Notes (Signed)
PROGRESS NOTE    Krystal Patrick  ZOX:[id number] DOB: [birth year] DOA: 01/26/2018 PCP: Kaleen Mask, MD   Brief Narrative: Krystal Patrick is a 84 y.o. femalewith medical history significant ofHTN, CAD, AAA, and depression. She presented with altered mental status likely secondary to gabapentin. SIRS on admission but infection ruled out. Hospitalization complicated by acute heart failure and atrial fibrillation with RVR in addition to bradycardia.   Assessment & Plan:   Principal Problem:   SIRS (systemic inflammatory response syndrome) (HCC) Active Problems:   AAA (abdominal aortic aneurysm) without rupture (HCC)   GERD (gastroesophageal reflux disease)   Acute metabolic encephalopathy   Hypotension   Acute blood loss anemia   Abnormal CT scan, gastrointestinal tract   Acute respiratory failure with hypoxia Secondary to fluid overload from acute heart failure. Resolved with diuresis.  Atrial fibrillation with RVR Thought secondary to heart failure/demand ischemia. Cardiology consulted earlier. Seems to have converted to sinus rhythm. Patient with episodes of sinus tachycardia still. Combined with multiple episodes of sinus bradycardia with ~2 second pauses -Cardiology recommendations: Continue no BB, no plans for antiarrhythmic per recommendations  Bradycardia Now with ~2 second pauses. Seen by EP who did not feel she was a candidate for pacemaker. -Cardiology: recommend no PPM  Acute blood loss anemia GI bleeding Patient with esophagitis and diverticulosis. She received 2 units of PRBC with significant improvement of hemoglobin which is now at baseline. She had an EGD which was significant for LA Grade D esophagitis in addition to a hiatal hernia and multiple non-bleeding ulcers. Biopsies pending -Continue PPI  Acute metabolic encephalopathy Thought to be secondary to gabapentin. Dosing changed.  Nutrition Dysphagia 3 diet  SIRS No source. IV  antibiotics empirically which have been discontinued.  Acute on chronic diastolic heart failure Secondary to fluid resuscitation on admission. Diuresed with resolution. Normal EF. -Cardiology recommendations: restart lasix IV  Rectal pain Could not find source. Patient cannot localize it well. -Restart Anusol prn   DVT prophylaxis: SCDs Code Status:   Code Status: Full Code Family Communication: Daughter at bedside Disposition Plan: Discharge to SNF when medically ready   Consultants:   Cardiology  Gastroenterology  Procedures:   10/1: EGD Impression:       - LA Grade D esophagitis. Biopsied.                           - Hiatal hernia. Biopsied.                           - One partially obstructing non-bleeding duodenal                            ulcer with no stigmata of bleeding. There is no                            evidence of perforation.                           - One non-bleeding duodenal ulcer with no stigmata                            of bleeding.                           -  Normal second portion of the duodenum.  Recommendation:    - Mechanical soft diet.                           - Continue present medications.                           - Use Prilosec (omeprazole) 40 mg PO BID.                           - No high dose aspirin, ibuprofen, naproxen, or                            other non-steroidal anti-inflammatory drugs.                           - Await pathology results and treat if H.pylori                            positive                           - Repeat Hgb in 1-2 weeks                           - OK to discharge home from GI stanpoint.                           - Follow an antireflux regimen indefinitely. This                            includes:                           - Do not lie down for at least 3 to 4 hours after                            meals.                           - Raise the head of the bed 4 to 6 inches.                            - Decrease excess weight.                           - Avoid citrus juices and other acidic foods,                            alcohol, chocolate, mints, coffee and other                            caffeinated beverages, carbonated beverages, fatty                            and fried foods.                           -  Avoid tight-fitting clothing.                           - Avoid cigarettes and other tobacco   Antimicrobials:  Vancomycin (9/23>>9/24)  Ceftriaxone (9/23)  Cefepime (9/24>>9/25)  Aztreonam (9/23>>9/24)    Subjective: No issues overnight. Has some rectal pain.  Objective: Vitals:   02/05/18 0800 02/05/18 1335 02/05/18 1339 02/05/18 1400  BP: 128/60 (!) 86/49 (!) 100/50 110/60  Pulse:  82    Resp:  (!) 22 20   Temp:  (!) 97.5 F (36.4 C) 98.1 F (36.7 C)   TempSrc:  Oral    SpO2:  96%    Weight:      Height:        Intake/Output Summary (Last 24 hours) at 02/05/2018 1604 Last data filed at 02/05/2018 1500 Gross per 24 hour  Intake 503 ml  Output 250 ml  Net 253 ml   Filed Weights   01/26/18 2126  Weight: 57 kg    Examination:  General exam: Appears calm and comfortable Respiratory system: Clear to auscultation. Respiratory effort normal. Cardiovascular system: S1 & S2 heard, RRR. No murmurs. Gastrointestinal system: Abdomen is nondistended, soft and nontender. Normal bowel sounds heard. Central nervous system: Alert and oriented. No focal neurological deficits. GU: Anus with fresh stool. No external lesions visible. no tender hemorrhoids, stool in rectum. Extremities: No edema. No calf tenderness Skin: No cyanosis. No rashes Psychiatry: Judgement and insight appear normal. Mood & affect appropriate.     Data Reviewed: I have personally reviewed following labs and imaging studies  CBC: Recent Labs  Lab 01/31/18 0530 02/03/18 1618  WBC 10.4 11.1*  HGB 13.0 13.2  HCT 39.1 42.2  MCV 88.9 94.0  PLT 308 304   Basic Metabolic  Panel: Recent Labs  Lab 01/30/18 0647 01/31/18 0530 02/02/18 0930 02/03/18 1618  NA 144 142 146* 144  K 3.8 4.0 3.9 4.1  CL 110 104 103 102  CO2 24 29 31 30   GLUCOSE 93 104* 124* 154*  BUN 20 23 26* 24*  CREATININE 0.71 0.77 0.78 0.81  CALCIUM 8.6* 8.7* 8.6* 8.6*   GFR: Estimated Creatinine Clearance: 32.9 mL/min (by C-G formula based on SCr of 0.81 mg/dL).   Recent Results (from the past 240 hour(s))  Blood Culture (routine x 2)     Status: None   Collection Time: 01/27/18 12:44 AM  Result Value Ref Range Status   Specimen Description   Final    BLOOD RIGHT UPPER ARM Performed at [facility], [address] [place], [place] [address]    Special Requests   Final    BOTTLES DRAWN AEROBIC AND ANAEROBIC Blood Culture adequate volume Performed at [facility], [address] [place], [place] [address]    Culture   Final    NO GROWTH 5 DAYS Performed at [facility], [address], [place], [place] [address]    Report Status 02/01/2018 FINAL  Final  MRSA PCR Screening     Status: None   Collection Time: 01/27/18 11:07 AM  Result Value Ref Range Status   MRSA by PCR NEGATIVE NEGATIVE Final    Comment:        The GeneXpert MRSA Assay (FDA approved for NASAL specimens only), is one component of a comprehensive MRSA colonization surveillance program. It is not intended to diagnose MRSA infection nor to guide or monitor treatment for MRSA infections.  Performed at [facility], [address], [place], [place] [address]   Culture, Urine     Status: None   Collection Time: 01/27/18 11:26 AM  Result Value Ref Range Status   Specimen Description   Final    Urine Performed at [facility], [address], [place], [place] [address]    Special Requests   Final    NONE Performed at [facility], [address] [place], [place] [address]    Culture   Final    NO  GROWTH Performed at [facility], [address] [place], [place] [address]    Report Status 01/28/2018 FINAL  Final  Blood Culture (routine x 2)     Status: None   Collection Time: 01/27/18  2:14 PM  Result Value Ref Range Status   Specimen Description   Final    BLOOD LEFT HAND Performed at [facility], [address] [place], [place] [address]    Special Requests   Final    BOTTLES DRAWN AEROBIC ONLY Blood Culture adequate volume Performed at [facility], [address], [place], [place] [address]    Culture   Final    NO GROWTH 5 DAYS Performed at [facility], [address], [place], [place] [address]    Report Status 02/01/2018 FINAL  Final         Radiology Studies: No results found.      Scheduled Meds: . sodium chloride   Intravenous Once  . capsaicin  1 application Topical Daily  . feeding supplement (ENSURE ENLIVE)  237 mL Oral BID BM  . furosemide  20 mg Intravenous Daily  . gabapentin  300 mg Oral Daily  . pantoprazole  40 mg Oral BID AC  . potassium chloride  40 mEq Oral Daily  . sodium chloride flush  10-40 mL Intracatheter Q12H  . sodium chloride flush  3 mL Intravenous Q12H   Continuous Infusions:   LOS: 9 days     Jacquelin Hawking, MD [facility] 02/05/2018, 4:04 PM  If 7PM-7AM, please contact night-coverage [email or web address] 02/05/2018, 4:04 PM

## 2018-02-05 NOTE — Progress Notes (Signed)
BP= 100/50; RN checked manually.Paged MD at 1344. Waiting for order.

## 2018-02-05 NOTE — Progress Notes (Signed)
Patient runs SVT about 1 minute; paged MD. Waiting for order.

## 2018-02-05 NOTE — Progress Notes (Addendum)
Progress Note  Patient Name: Krystal Patrick Date of Encounter: 02/05/2018  Primary Cardiologist: Rollene Rotunda, MD   Subjective   Denies CP or SOB, but has not done much. No palpitations.  Inpatient Medications    Scheduled Meds: . sodium chloride   Intravenous Once  . capsaicin  1 application Topical Daily  . feeding supplement (ENSURE ENLIVE)  237 mL Oral BID BM  . gabapentin  300 mg Oral Daily  . pantoprazole  40 mg Oral BID AC  . potassium chloride  40 mEq Oral Daily  . sodium chloride flush  10-40 mL Intracatheter Q12H  . sodium chloride flush  3 mL Intravenous Q12H   Continuous Infusions:  PRN Meds: acetaminophen **OR** acetaminophen, albuterol, magnesium hydroxide, Melatonin, metoprolol tartrate, ondansetron **OR** ondansetron (ZOFRAN) IV, RESOURCE THICKENUP CLEAR, sodium chloride flush, traMADol   Vital Signs    Vitals:   02/04/18 1346 02/04/18 2057 02/04/18 2243 02/05/18 0520  BP: 110/62 (!) 81/45 (!) 132/22 (!) 127/44  Pulse: 83 73 82 74  Resp: 20 (!) 24 20 18   Temp: 97.9 F (36.6 C) 98.2 F (36.8 C) 98 F (36.7 C) 98 F (36.7 C)  TempSrc: Oral   Oral  SpO2: (!) 72% 94%  94%  Weight:      Height:        Intake/Output Summary (Last 24 hours) at 02/05/2018 0751 Last data filed at 02/05/2018 0524 Gross per 24 hour  Intake 600 ml  Output 250 ml  Net 350 ml   Filed Weights   01/26/18 2126  Weight: 57 kg    Telemetry    SR w/ several episodes ?atrial tach, PACs - Personally Reviewed  ECG    No new tracings - Personally Reviewed  Physical Exam   General: Well developed, frail, elderly, female in no acute distress Head: Eyes PERRLA, No xanthomas.   Normocephalic and atraumatic  Lungs: rales bases bilaterally to auscultation. Heart: HRRR S1 S2, without RG. +SEM.  Pulses are 2+ & equal. No carotid bruit. JVD 10 cm. Abdomen: Bowel sounds are present, abdomen soft and non-tender without masses or  hernias noted. Msk: Weak strength and tone  for age. Extremities: No clubbing, cyanosis or edema.    Skin:  No rashes or lesions noted. Neuro: Alert and oriented X 3. Psych:  Good affect, responds appropriately   Labs    Chemistry Recent Labs  Lab 01/31/18 0530 02/02/18 0930 02/03/18 1618  NA 142 146* 144  K 4.0 3.9 4.1  CL 104 103 102  CO2 29 31 30   GLUCOSE 104* 124* 154*  BUN 23 26* 24*  CREATININE 0.77 0.78 0.81  CALCIUM 8.7* 8.6* 8.6*  GFRNONAA >60 >60 60*  GFRAA >60 >60 >60  ANIONGAP 9 12 12      Hematology Recent Labs  Lab 01/29/18 1154 01/31/18 0530 02/03/18 1618  WBC 17.7* 10.4 11.1*  RBC 4.06 4.40 4.49  HGB 11.8* 13.0 13.2  HCT 36.2 39.1 42.2  MCV 89.2 88.9 94.0  MCH 29.1 29.5 29.4  MCHC 32.6 33.2 31.3  RDW 15.4 15.4 15.0  PLT 399 308 304    Cardiac Enzymes Recent Labs  Lab 01/29/18 0812  TROPONINI 3.51*     Radiology    No results found.  Cardiac Studies   Echo 01/09/18: Study Conclusions - Left ventricle: The cavity size was normal. Wall thickness was   normal. Systolic function was vigorous. The estimated ejection   fraction was in the range of 65% to 70%. Wall  motion was normal;   there were no regional wall motion abnormalities. Doppler   parameters are consistent with abnormal left ventricular   relaxation (grade 1 diastolic dysfunction). - Aortic valve: There was no stenosis. - Mitral valve: Moderately calcified annulus. There was trivial   regurgitation. - Right ventricle: The cavity size was normal. Systolic function   was normal. - Tricuspid valve: Peak RV-RA gradient (S): 60 mm Hg. - Pulmonary arteries: PA peak pressure: 63 mm Hg (S). - Inferior vena cava: The vessel was normal in size. The   respirophasic diameter changes were in the normal range (= 50%),   consistent with normal central venous pressure.  Impressions: - Normal LV size with EF 65-70%. Normal RV size and systolic   function. No significant valvular abnormalities. Moderate   pulmonary  hypertension.  Patient Profile     84 y.o. female w/ hx AAA, GERD, reported CAD but no documentation, of this, HTN, was admitted 09/23 w/ SIRS, ?UGIB, hypotension, rapid atrial fib.  She has also had sinus bradycardia observed.  Assessment & Plan    1. Sinus bradycardia - may have been vagal - none in > 24 hr - no AV nodal blocking agents - no indication for PPM  2. Persistent atrial fibrillation, PVCs - SR w/ episodes of ?atrial tach and PACs on tele - follow, no BB./CCB  3. Anemia - EGD 09/30 w/ grade D esophagitis, HH, 2 duodenal ulcers - Per GI/IM  4. Acute respiratory failure, pulmonary HTN, chronic diastolic heart failure - diuresed w/ IV Lasix 20 mg qd, volume improved initially but worsening now - no Lasix since 09/30 - pt was diuresing on 20 mg IV lasix daily, will restart x 3 days - no weights and I/O incomplete  5. Elevated troponin - felt 2nd demand ischemia - no additional workup planned    For questions or updates, please contact [facility] Please consult [email or web address] for contact info under     Signed, Theodore Demark, PA-C  02/05/2018, 7:51 AM    The patient was seen, examined and discussed with Theodore Demark, PA-C and I agree with the above.   84 yea old female with sinus bradycardia and PAF - short episodes, not on anticoagulation as she was admitted with anemia, I have reviewed her telemetry, there are no more episodes of sinus bradycardia, but short episodes of atrial tachycardia, asymptomatic. No indication for any intervention right now, continue current management, avoid AVN blocking agent.   Tobias Alexander, MD 02/05/2018

## 2018-02-05 NOTE — Progress Notes (Signed)
CSW spoke with patient's daughter, Cathlyn Parsons, at her request. Patient's daughter states she has decided it will be best for patient to return to [facility] to continue short term rehab before returning home.   CSW will continue to follow for discharge coordination when patient is medically stable.   Enid Cutter, MSW, LCSWA Clinical Social Work [phone]

## 2018-02-06 LAB — BASIC METABOLIC PANEL
ANION GAP: 9 (ref 5–15)
BUN: 26 mg/dL — ABNORMAL HIGH (ref 8–23)
CHLORIDE: 104 mmol/L (ref 98–111)
CO2: 29 mmol/L (ref 22–32)
Calcium: 8.3 mg/dL — ABNORMAL LOW (ref 8.9–10.3)
Creatinine, Ser: 0.84 mg/dL (ref 0.44–1.00)
GFR calc non Af Amer: 57 mL/min — ABNORMAL LOW (ref >60)
GLUCOSE: 99 mg/dL (ref 70–99)
POTASSIUM: 5.8 mmol/L — AB (ref 3.5–5.1)
Sodium: 142 mmol/L (ref 135–145)

## 2018-02-06 MED ORDER — BISACODYL 10 MG RE SUPP
10.0000 mg | Freq: Once | RECTAL | Status: AC
  Administered 2018-02-06: 10 mg via RECTAL

## 2018-02-06 MED ORDER — SODIUM POLYSTYRENE SULFONATE 15 GM/60ML PO SUSP
15.0000 g | Freq: Once | ORAL | Status: AC
  Administered 2018-02-06: 15 g via ORAL
  Filled 2018-02-06: qty 60

## 2018-02-06 MED ORDER — CEFDINIR 300 MG PO CAPS
300.0000 mg | ORAL_CAPSULE | Freq: Two times a day (BID) | ORAL | Status: DC
  Administered 2018-02-06 – 2018-02-07 (×2): 300 mg via ORAL
  Filled 2018-02-06 (×3): qty 1

## 2018-02-06 MED ORDER — METHOCARBAMOL 500 MG PO TABS: 500.0000 mg | ORAL_TABLET | Freq: Three times a day (TID) | ORAL | Status: AC | PRN

## 2018-02-06 MED ORDER — HYDROCORTISONE ACETATE 25 MG RE SUPP: 25.0000 mg | Freq: Two times a day (BID) | RECTAL | Status: AC | PRN

## 2018-02-06 MED ORDER — PANTOPRAZOLE SODIUM 40 MG PO TBEC: 40.0000 mg | DELAYED_RELEASE_TABLET | Freq: Two times a day (BID) | ORAL | Status: AC

## 2018-02-06 MED ORDER — TRAMADOL HCL 50 MG PO TABS: 25.0000 mg | ORAL_TABLET | Freq: Two times a day (BID) | ORAL | 0 refills | Status: AC | PRN

## 2018-02-06 MED ORDER — CEFDINIR 300 MG PO CAPS: 300.0000 mg | ORAL_CAPSULE | Freq: Two times a day (BID) | ORAL | Status: AC

## 2018-02-06 NOTE — Progress Notes (Signed)
PROGRESS NOTE    Krystal Patrick  ZOX:[id number] DOB: 1920/09/21 DOA: 01/26/2018 PCP: Kaleen Mask, MD   Brief Narrative: Krystal Patrick is a 84 y.o. femalewith medical history significant ofHTN, CAD, AAA, and depression. She presented with altered mental status likely secondary to gabapentin. SIRS on admission but infection ruled out. Hospitalization complicated by acute heart failure and atrial fibrillation with RVR in addition to bradycardia.   Assessment & Plan:   Principal Problem:   SIRS (systemic inflammatory response syndrome) (HCC) Active Problems:   AAA (abdominal aortic aneurysm) without rupture (HCC)   GERD (gastroesophageal reflux disease)   Acute metabolic encephalopathy   Hypotension   Acute blood loss anemia   Abnormal CT scan, gastrointestinal tract   Acute respiratory failure with hypoxia Secondary to fluid overload from acute heart failure. Resolved with diuresis.  Atrial fibrillation with RVR Thought secondary to heart failure/demand ischemia. Cardiology consulted earlier. Seems to have converted to sinus rhythm. Patient with episodes of sinus tachycardia still. Combined with multiple episodes of sinus bradycardia with ~2 second pauses -Cardiology recommendations: Continue no BB, no plans for antiarrhythmic per recommendations  Bradycardia Now with ~2 second pauses. Seen by EP who did not feel she was a candidate for pacemaker. -Cardiology: recommend no PPM  Acute blood loss anemia GI bleeding Patient with esophagitis and diverticulosis. She received 2 units of PRBC with significant improvement of hemoglobin which is now at baseline. She had an EGD which was significant for LA Grade D esophagitis in addition to a hiatal hernia and multiple non-bleeding ulcers. Biopsies pending -Continue PPI  Acute metabolic encephalopathy Thought to be secondary to gabapentin. Dosing changed.  Nutrition Dysphagia 3 diet  SIRS No source. IV  antibiotics empirically which have been discontinued.  Acute on chronic diastolic heart failure Secondary to fluid resuscitation on admission. Diuresed with resolution. Normal EF. -Cardiology recommendations: restart lasix IV  Dysuria Could not find source. Patient cannot localize it well. Seems possibly dysuria rather than rectal pain. Patient with associated frequency -Cefdinir -Urine culture  Hyperkalemia In setting of supplementation. -EKG -Kayexalate    DVT prophylaxis: SCDs Code Status:   Code Status: Full Code Family Communication: Daughter at bedside Disposition Plan: Discharge to SNF in 24 hours if potassium improved   Consultants:   Cardiology  Gastroenterology  Procedures:   10/1: EGD Impression:       - LA Grade D esophagitis. Biopsied.                           - Hiatal hernia. Biopsied.                           - One partially obstructing non-bleeding duodenal                            ulcer with no stigmata of bleeding. There is no                            evidence of perforation.                           - One non-bleeding duodenal ulcer with no stigmata  of bleeding.                           - Normal second portion of the duodenum.  Recommendation:    - Mechanical soft diet.                           - Continue present medications.                           - Use Prilosec (omeprazole) 40 mg PO BID.                           - No high dose aspirin, ibuprofen, naproxen, or                            other non-steroidal anti-inflammatory drugs.                           - Await pathology results and treat if H.pylori                            positive                           - Repeat Hgb in 1-2 weeks                           - OK to discharge home from GI stanpoint.                           - Follow an antireflux regimen indefinitely. This                            includes:                           - Do not lie  down for at least 3 to 4 hours after                            meals.                           - Raise the head of the bed 4 to 6 inches.                           - Decrease excess weight.                           - Avoid citrus juices and other acidic foods,                            alcohol, chocolate, mints, coffee and other                            caffeinated beverages, carbonated beverages, fatty  and fried foods.                           - Avoid tight-fitting clothing.                           - Avoid cigarettes and other tobacco   Antimicrobials:  Vancomycin (9/23>>9/24)  Ceftriaxone (9/23)  Cefepime (9/24>>9/25)  Aztreonam (9/23>>9/24)    Subjective: Dysuria. No other issues  Objective: Vitals:   02/05/18 1400 02/05/18 2050 02/06/18 0459 02/06/18 1400  BP: 110/60 98/64 122/65 120/74  Pulse:  78 75 83  Resp:  16 17 17   Temp:  98.1 F (36.7 C) 98.8 F (37.1 C) (!) 97.5 F (36.4 C)  TempSrc:  Oral Oral Oral  SpO2:  92% 94% 92%  Weight:      Height:        Intake/Output Summary (Last 24 hours) at 02/06/2018 1417 Last data filed at 02/06/2018 0500 Gross per 24 hour  Intake 250 ml  Output 1000 ml  Net -750 ml   Filed Weights   01/26/18 2126  Weight: 57 kg    Examination:  General exam: Appears calm and comfortable Respiratory system: Clear to auscultation. Respiratory effort normal. Cardiovascular system: S1 & S2 heard, RRR. No murmurs, rubs, gallops or clicks. Gastrointestinal system: Abdomen is nondistended, soft and nontender. No organomegaly or masses felt. Normal bowel sounds heard. Central nervous system: Alert and oriented. No focal neurological deficits. Extremities: No edema. No calf tenderness Skin: No cyanosis. No rashes Psychiatry: Judgement and insight appear normal. Mood & affect appropriate.     Data Reviewed: I have personally reviewed following labs and imaging studies  CBC: Recent Labs  Lab  01/31/18 0530 02/03/18 1618  WBC 10.4 11.1*  HGB 13.0 13.2  HCT 39.1 42.2  MCV 88.9 94.0  PLT 308 304   Basic Metabolic Panel: Recent Labs  Lab 01/31/18 0530 02/02/18 0930 02/03/18 1618 02/06/18 1327  NA 142 146* 144 142  K 4.0 3.9 4.1 5.8*  CL 104 103 102 104  CO2 29 31 30 29   GLUCOSE 104* 124* 154* 99  BUN 23 26* 24* 26*  CREATININE 0.77 0.78 0.81 0.84  CALCIUM 8.7* 8.6* 8.6* 8.3*   GFR: Estimated Creatinine Clearance: 31.7 mL/min (by C-G formula based on SCr of 0.84 mg/dL).   No results found for this or any previous visit (from the past 240 hour(s)).       Radiology Studies: No results found.      Scheduled Meds: . sodium chloride   Intravenous Once  . capsaicin  1 application Topical Daily  . cefdinir  300 mg Oral Q12H  . feeding supplement (ENSURE ENLIVE)  237 mL Oral BID BM  . gabapentin  300 mg Oral Daily  . pantoprazole  40 mg Oral BID AC  . sodium chloride flush  10-40 mL Intracatheter Q12H  . sodium chloride flush  3 mL Intravenous Q12H  . sodium polystyrene  15 g Oral Once   Continuous Infusions:   LOS: 10 days     Jacquelin Hawking, MD [facility] 02/06/2018, 2:17 PM  If 7PM-7AM, please contact night-coverage [email or web address] 02/06/2018, 2:17 PM

## 2018-02-06 NOTE — Care Management Important Message (Signed)
Important Message  Patient Details  Name: Krystal Patrick MRN: [id number] Date of Birth: [birth year]   Medicare Important Message Given:  Yes    Caren Macadam 02/06/2018, 10:54 AM

## 2018-02-06 NOTE — Progress Notes (Signed)
Patient did not receive first ordered dose of Omnicef d/t waiting for urine sample.  Sample is being sent.  Per pharmacy give 10 pm dose at 8pm and will continue with 10 am & 10 pm.

## 2018-02-06 NOTE — Discharge Summary (Addendum)
Physician Discharge Summary  Krystal Patrick ZOX:[id number] DOB: November 01, 1920 DOA: 01/26/2018  PCP: Kaleen Mask, MD  Admit date: 01/26/2018 Discharge date: 02/07/2018  Admitted From: SNF Disposition: SNF  Recommendations for Outpatient Follow-up:  1. Follow up with PCP in 1 week 2. Follow up with GI as needed 3. Follow up with cardiology as needed 4. Please follow up on the following pending results: Urine culture  Home Health: SNF Equipment/Devices: None  Discharge Condition: Stable CODE STATUS: Full code Diet recommendation: Dysphagia 3, thin liquids   Brief/Interim Summary:  Admission HPI written by Clydie Braun, MD   HPI: Krystal Patrick is a 84 y.o. female with medical history significant of HTN, CAD, AAA, and depression; who presents with altered mental status.  History is obtained from the patient's grandson who is present at bedside as patient is unable to provide her own history at this time as she is altered.  Patient just recently been hospitalized from 9/5-9/9, after having a fall with complaints of low back pain and sciatica.  Patient had been sent to [facility] where she was nearing discharge.  However, patient had been noted to have a elevated white blood cell count on recent lab work.  At baseline patient is very sharp and with it.  Her grandson noted that around noon yesterday she was just mildly confused which is not abnormal for her from time to time, but by 5 PM that afternoon she was completely altered.  Patient reportedly was not making sense with what she was saying, talking to people that are not there, and slurring her speech.  While at rehabilitation patient reportedly having a poor appetite with poor oral intake and new cough over the last 1 week.  Family suspected that the patient was dehydrated and/or possibly had a urinary tract infection.   ED Course: On admission into the emergency department patient was noted to be  afebrile, pulse 98-1 13, respirations 17-26, blood pressure 87/47-121/68, and O2 saturations maintained maintained.  Labs revealed WBC 29.8, hemoglobin 9.7, sodium 130, potassium 5.4, BUN 35, and creatinine 0.93.  Urinalysis did not show any acute signs of infection.  Chest x-ray and CT of the brain showed no acute abnormalities.  CT of the abdomen and pelvis revealed a 3.5 infrarenal AAA aneurysm and inflammation at the esophagus concerning for esophagitis.  Sepsis protocol initiated with patient given total of 1.75 L of normal saline IV fluids and Rocephin for presumed urinary source.  However, after urinalysis came back negative for signs of infection aztreonam and vancomycin were added on.  [facility] called to admit.    Hospital course:  Acute respiratory failure with hypoxia Secondary to fluid overload from acute heart failure. Resolved with diuresis.  Atrial fibrillation with RVR Thought secondary to heart failure/demand ischemia. Cardiology consulted earlier. Had associated elevated troponin thought to be secondary to demand ischemia. Seems to have converted to sinus rhythm. Patient with intermittent episodes of sinus tachycardia.   Bradycardia Sinus with two episodes of ~2 second pauses. Seen by EP who did not feel she was a candidate for pacemaker. No treatment at this time. Avoid beta blockers/calcium channel blockers. No treatment plan at this time.  Acute blood loss anemia GI bleeding Patient with esophagitis and diverticulosis. She received 2 units of PRBC with significant improvement of hemoglobin which is now at baseline. She had an EGD which was significant for LA Grade D esophagitis in addition to a hiatal hernia and multiple non-bleeding ulcers. H. Pylori negative  on biopsy. Proton pump inhibitor.  Acute metabolic encephalopathy Thought to be secondary to gabapentin. Dosing changed.  Nutrition Dysphagia 3 diet  SIRS No source. IV antibiotics empirically which have been  discontinued.  Acute on chronic diastolic heart failure Secondary to fluid resuscitation on admission. Diuresed with resolution. Normal EF. Euvolemic. Monitor daily weights. No lasix prescription on discharge. Outpatient follow-up as needed.  Rectal pain Could not find source. Patient cannot localize it well. Anusol prn  Discharge Diagnoses:  Principal Problem:   SIRS (systemic inflammatory response syndrome) (HCC) Active Problems:   AAA (abdominal aortic aneurysm) without rupture (HCC)   GERD (gastroesophageal reflux disease)   Acute metabolic encephalopathy   Hypotension   Acute blood loss anemia   Abnormal CT scan, gastrointestinal tract    Discharge Instructions   Allergies as of 02/07/2018      Reactions   Penicillins Rash   Has patient had a PCN reaction causing immediate rash, facial/tongue/throat swelling, SOB or lightheadedness with hypotension: Unknown Has patient had a PCN reaction causing severe rash involving mucus membranes or skin necrosis: Unknown Has patient had a PCN reaction that required hospitalization: No Has patient had a PCN reaction occurring within the last 10 years: No If all of the above answers are "NO", then may proceed with Cephalosporin use.      Medication List    STOP taking these medications   amLODipine 10 MG tablet Commonly known as:  NORVASC   lisinopril 40 MG tablet Commonly known as:  PRINIVIL,ZESTRIL   metoprolol tartrate 25 MG tablet Commonly known as:  LOPRESSOR     TAKE these medications   acetaminophen 650 MG CR tablet Commonly known as:  TYLENOL Take 650 mg by mouth every 8 (eight) hours as needed for pain.   ARTHRITIS PAIN RELIEF 0.025 % cream Generic drug:  capsaicin Apply 1 application topically daily.   aspirin EC 81 MG tablet Take 81 mg by mouth daily.   cefdinir 300 MG capsule Commonly known as:  OMNICEF Take 1 capsule (300 mg total) by mouth every 12 (twelve) hours for 7 days.   furosemide 20 MG  tablet Commonly known as:  LASIX Take 20 mg by mouth daily. Monday, wednesday, and friday   gabapentin 300 MG capsule Commonly known as:  NEURONTIN Take 1 capsule (300 mg total) by mouth daily. Start taking on:  02/08/2018 What changed:  when to take this   hydrocortisone 25 MG suppository Commonly known as:  ANUSOL-HC Place 1 suppository (25 mg total) rectally 2 (two) times daily as needed for hemorrhoids or anal itching.   Melatonin 5 MG Tabs Take 5 mg by mouth at bedtime as needed (sleep).   methocarbamol 500 MG tablet Commonly known as:  ROBAXIN Take 1 tablet (500 mg total) by mouth every 8 (eight) hours as needed for muscle spasms. What changed:    when to take this  reasons to take this  Another medication with the same name was removed. Continue taking this medication, and follow the directions you see here.   NUTRITIONAL DRINK PO Take 90 mLs by mouth 2 (two) times daily.   pantoprazole 40 MG tablet Commonly known as:  PROTONIX Take 1 tablet (40 mg total) by mouth 2 (two) times daily before a meal.   polyethylene glycol packet Commonly known as:  MIRALAX / GLYCOLAX Take 17 g by mouth daily as needed for mild constipation.   traMADol 50 MG tablet Commonly known as:  ULTRAM Take 0.5 tablets (25 mg  total) by mouth every 12 (twelve) hours as needed for moderate pain. What changed:  additional instructions   vitamin B-12 500 MCG tablet Commonly known as:  CYANOCOBALAMIN Take 500 mcg by mouth daily.   Vitamin D3 10000 units Tabs Take 1,000 Units by mouth daily.       Allergies  Allergen Reactions  . Penicillins Rash    Has patient had a PCN reaction causing immediate rash, facial/tongue/throat swelling, SOB or lightheadedness with hypotension: Unknown Has patient had a PCN reaction causing severe rash involving mucus membranes or skin necrosis: Unknown Has patient had a PCN reaction that required hospitalization: No Has patient had a PCN reaction occurring  within the last 10 years: No If all of the above answers are "NO", then may proceed with Cephalosporin use.     Consultations:  Cardiology   Procedures/Studies: Dg Chest 2 View  Result Date: 01/08/2018 CLINICAL DATA:  Weakness. EXAM: CHEST - 2 VIEW COMPARISON:  April 08, 2016 FINDINGS: The heart size and mediastinal contours are stable. The heart size is enlarged. The aorta is tortuous. There is no focal infiltrate, pulmonary edema, or pleural effusion. Calcified granulomas are identified bilaterally. The visualized skeletal structures are stable. IMPRESSION: No active cardiopulmonary disease. Electronically Signed   By: Sherian Rein M.D.   On: 01/08/2018 19:46   Dg Lumbar Spine 2-3 Views  Result Date: 01/27/2018 CLINICAL DATA:  Pain across lower back, no injury EXAM: LUMBAR SPINE - 2-3 VIEW COMPARISON:  Earlier CT abdomen and pelvis of 01/27/2018 FINDINGS: Osseous demineralization. Five non-rib-bearing lumbar vertebra. Hypoplastic last ribs. Vertebral body heights maintained without fracture or bone destruction. Minimal anterolisthesis L4-L5 likely due to facet degenerative changes, which are present throughout the lower lumbar spine. SI joints preserved. Excreted contrast material within urinary bladder. Atherosclerotic calcifications aorta with fusiform aneurysmal dilatation of the mid to distal abdominal aorta measuring 4.0 cm greatest AP diameter on lateral view, though this contains inherent radiographic magnification; the preceding CT abdomen and pelvis demonstrated maximal AP diameter of 3.4 cm at the same level. 7 mm nonobstructing inferior pole RIGHT renal calculus. IMPRESSION: Osseous demineralization with mild scattered degenerative facet disease changes of the lumbar spine. No acute lumbar spine abnormalities. Abdominal aortic aneurysm, please refer to preceding CT for accurate assessment Nonobstructing 7 mm RIGHT renal calculus. Electronically Signed   By: Ulyses Southward M.D.   On:  01/27/2018 14:56   Dg Lumbar Spine Complete  Result Date: 01/08/2018 CLINICAL DATA:  84 year old female with progressive pain with ambulation after suffering a fall last Saturday. EXAM: LUMBAR SPINE - COMPLETE 4+ VIEW COMPARISON:  Prior radiographs of the lumbar spine 06/02/2017 FINDINGS: Enlarging abdominal aortic aneurysm now measuring up to 4.1 cm compared to 3.9 cm previously. No evidence of acute fracture or malalignment. The vertebral body heights are maintained. Stable degenerative disc disease and lower lumbar facet arthropathy. Mild grade 1 anterolisthesis of L4 on L5 is similar compared to prior. Surgical clips in the right upper quadrant consistent with prior cholecystectomy. The bones are diffusely demineralized. IMPRESSION: 1. No acute fracture or malalignment. 2. Slight interval enlargement in abdominal aortic aneurysm now measuring up to 4.1 cm compared to 3.9 cm in January of 2019. 3. Lower lumbar degenerative disc disease and facet arthropathy. 4. Diffuse demineralization. Electronically Signed   By: Malachy Moan M.D.   On: 01/08/2018 18:03   Dg Pelvis 1-2 Views  Result Date: 01/08/2018 CLINICAL DATA:  84 year old female with progressive pain during ambulation since suffering a fall last  Saturday. EXAM: PELVIS - 1-2 VIEW COMPARISON:  Concurrently obtained radiographs of the lumbar spine and sacrum; prior pelvic radiographs 06/02/2017 FINDINGS: There is no evidence of pelvic fracture or diastasis. No pelvic bone lesions are seen. The bones are diffusely demineralized. Atherosclerotic calcifications noted in the bilateral femoral arteries. IMPRESSION: No definite acute fracture identified. Please note that evaluation for nondisplaced fractures is limited by the underlying osteopenia. If clinical concern persists, further imaging with MRI would be recommended as CT scan would also likely be insensitive. Electronically Signed   By: Malachy Moan M.D.   On: 01/08/2018 18:07   Dg  Sacrum/coccyx  Result Date: 01/08/2018 CLINICAL DATA:  85 year old female with progressive lower back pain with ambulation since sustaining a fall last Saturday EXAM: SACRUM AND COCCYX - 2+ VIEW COMPARISON:  Concurrently obtained radiographs of the lumbar spine and pelvis; prior radiographs of the pelvis 06/02/2017 FINDINGS: The bones are diffusely demineralized. No evidence of acute fracture or malalignment. Lower lumbar degenerative disc disease and facet arthropathy. IMPRESSION: No definite fracture identified although sensitivity for nondisplaced fractures is significantly limited by underlying osteopenia. If clinical concern persists, recommend further evaluation with pelvic MRI. Pelvic CT scan will likely be insufficiently sensitive due to the underlying osteopenia. Electronically Signed   By: Malachy Moan M.D.   On: 01/08/2018 18:05   Ct Head Wo Contrast  Result Date: 01/27/2018 CLINICAL DATA:  Altered mental status.  No reported injury. EXAM: CT HEAD WITHOUT CONTRAST TECHNIQUE: Contiguous axial images were obtained from the base of the skull through the vertex without intravenous contrast. COMPARISON:  05/30/2017 head CT. FINDINGS: Motion degraded scan, limiting assessment. Brain: Bilateral basal ganglia lacunes. No evidence of parenchymal hemorrhage or extra-axial fluid collection. No mass lesion, mass effect, or midline shift. No CT evidence of acute infarction. Generalized cerebral volume loss. Nonspecific moderate subcortical and periventricular white matter hypodensity, most in keeping with chronic small vessel ischemic change. Cerebral ventricle sizes are stable and concordant with the degree of cerebral volume loss. Vascular: No acute abnormality. Skull: No evidence of calvarial fracture. Sinuses/Orbits: Partial opacification of the sphenoid sinus and posterior right ethmoidal air cells, similar to prior. Other:  The mastoid air cells are unopacified. IMPRESSION: 1. Limited motion degraded  scan. 2.  No evidence of acute intracranial abnormality. 3. Generalized cerebral volume loss with moderate chronic small vessel ischemic changes in the cerebral white matter. 4. Chronic paranasal sinusitis. Electronically Signed   By: Delbert Phenix M.D.   On: 01/27/2018 02:48   Ct Angio Chest Pe W And/or Wo Contrast  Result Date: 01/08/2018 CLINICAL DATA:  Shortness of breath EXAM: CT ANGIOGRAPHY CHEST WITH CONTRAST TECHNIQUE: Multidetector CT imaging of the chest was performed using the standard protocol during bolus administration of intravenous contrast. Multiplanar CT image reconstructions and MIPs were obtained to evaluate the vascular anatomy. CONTRAST:  80 mL Isovue 370 intravenous COMPARISON:  Chest x-ray 01/08/2018 FINDINGS: Cardiovascular: Satisfactory opacification of the pulmonary arteries to the segmental level. No evidence of pulmonary embolism. Nonaneurysmal aorta. Moderate severe aortic atherosclerosis. No dissection. Coronary vascular calcification. Mild cardiomegaly. No pericardial effusion Mediastinum/Nodes: Midline trachea. 11 mm hypodense nodule left lobe of thyroid. Small hiatal hernia. No significant adenopathy Lungs/Pleura: Mild emphysema. No acute consolidation or effusion. No pneumothorax Upper Abdomen: No acute abnormality. Musculoskeletal: Mild compression deformity of T2, uncertain chronicity. Review of the MIP images confirms the above findings. IMPRESSION: 1. Negative for acute pulmonary embolus or aortic dissection 2. No acute pulmonary infiltrate.  Mild emphysema 3. Age  indeterminate mild compression deformity T2 Aortic Atherosclerosis (ICD10-I70.0) and Emphysema (ICD10-J43.9). Electronically Signed   By: Jasmine Pang M.D.   On: 01/08/2018 23:19   Ct Abdomen Pelvis W Contrast  Result Date: 01/27/2018 CLINICAL DATA:  84 year old female with sepsis. EXAM: CT ABDOMEN AND PELVIS WITH CONTRAST TECHNIQUE: Multidetector CT imaging of the abdomen and pelvis was performed using the  standard protocol following bolus administration of intravenous contrast. CONTRAST:  75mL OMNIPAQUE IOHEXOL 300 MG/ML  SOLN COMPARISON:  Chest CT dated 01/08/2018 FINDINGS: Evaluation of this exam is limited in the absence of intravenous contrast. Lower chest: Partially visualized small right pleural effusion and subsegmental right lung base atelectatic changes. Calcified right lung base granuloma. Multi vessel coronary vascular calcification and calcification of the mitral annulus. No intra-abdominal free air or free fluid. Hepatobiliary: The liver is unremarkable. No intrahepatic biliary ductal dilatation. Cholecystectomy. Pancreas: Unremarkable. No pancreatic ductal dilatation or surrounding inflammatory changes. Spleen: Normal in size without focal abnormality. Adrenals/Urinary Tract: The adrenal glands are unremarkable. There is no hydronephrosis or nephrolithiasis on either side. There is a 5 cm right renal inferior pole cyst. There is symmetric enhancement and excretion of contrast by both kidneys. A 2.5 cm hypodense lesion arising from the superior pole of the left kidney is not well characterized but may represent a complex or septated cyst. This can be further evaluated with ultrasound or MRI on a nonemergent basis. Duplicated bilateral renal collecting systems. The urinary bladder is unremarkable. Stomach/Bowel: There is diffuse circumferential thickening of the esophagus most consistent with esophagitis. There is apparent thickening of the gastroesophageal junction which may be inflammatory in nature although a neoplasm is not excluded. Further evaluation with endoscopy after resolution of active inflammatory changes of the esophagus is recommended. Small pocket of gas along the medial wall of the second portion of the duodenum is suboptimally evaluated but may represent gas within the lumen although duodenal diverticulum or ulcer is not excluded. Evaluation of this area is very limited on this CT. There  is extensive sigmoid diverticulosis without active inflammatory changes. Large amount of stool noted in the distal colon. There is no bowel obstruction. Vascular/Lymphatic: Advanced aortoiliac atherosclerotic disease. There is a 3.5 cm infrarenal aortic aneurysm. Reproductive: Hysterectomy.  No pelvic mass. Other: None Musculoskeletal: Osteopenia with degenerative changes of the spine. Grade 1 L4-L5 anterolisthesis. Probable old healed sacral fracture. No acute osseous pathology. IMPRESSION: 1. Circumferential thickening and inflammation of the esophagus most consistent with esophagitis. Soft tissue thickening of the gastroesophageal junction may be inflammatory or neoplastic. Further evaluation with endoscopy following resolution of the acute esophageal inflammation recommended. 2. Possible diverticula of the medial wall of the second portion of the duodenum. 3. Extensive sigmoid diverticulosis.  No bowel obstruction. 4. Advanced choose 1 5. A 3.5 cm infrarenal aortic aneurysm. Recommend followup by ultrasound in 2 years. This recommendation follows ACR consensus guidelines: White Paper of the ACR Incidental Findings Committee II on Vascular Findings. J Am Coll Radiol 2013; 10:789-794. Electronically Signed   By: Elgie Collard M.D.   On: 01/27/2018 03:39   Dg Chest Port 1 View  Result Date: 02/01/2018 CLINICAL DATA:  Hypertension, coronary artery disease EXAM: PORTABLE CHEST 1 VIEW COMPARISON:  01/28/2018, 01/27/2018, 01/09/2018 FINDINGS: Aeration is slightly improved. Small right-sided pleural effusion and right basilar airspace disease. Decreased vascular congestion and edema. Stable enlarged cardiomediastinal silhouette with aortic atherosclerosis. Dense mitral calcification. IMPRESSION: 1. Slightly improved aeration with decreased vascular congestion and edema 2. Small right pleural effusion with right basilar  airspace disease. Electronically Signed   By: Jasmine Pang M.D.   On: 02/01/2018 14:30   Dg  Chest Port 1 View  Result Date: 01/28/2018 CLINICAL DATA:  Acute respiratory distress EXAM: PORTABLE CHEST 1 VIEW COMPARISON:  01/27/2018 FINDINGS: The heart remains moderately enlarged. Vascular congestion. Mild interstitial edema. Small pleural effusions are suspected. IMPRESSION: CHF with mild pulmonary edema and suspected small pleural effusions. Electronically Signed   By: Jolaine Click M.D.   On: 01/28/2018 12:57   Dg Chest Port 1 View  Result Date: 01/27/2018 CLINICAL DATA:  84 year old female with hypotension and altered mental status. EXAM: PORTABLE CHEST 1 VIEW COMPARISON:  Chest radiograph dated 01/09/2018 FINDINGS: The lungs are clear. There is no pleural effusion or pneumothorax. There is mild cardiomegaly. There is calcification of the mitral annulus as well as atherosclerotic calcification of the thoracic aorta. No acute osseous pathology. IMPRESSION: No active disease. Electronically Signed   By: Elgie Collard M.D.   On: 01/27/2018 02:16   Dg Chest Port 1 View  Result Date: 01/09/2018 CLINICAL DATA:  Hypoxia. EXAM: PORTABLE CHEST 1 VIEW COMPARISON:  Yesterday. FINDINGS: Stable enlarged cardiac silhouette and tortuous and calcified thoracic aorta. The lungs remain mildly hyperexpanded with stable mild prominence of the pulmonary vasculature and interstitial markings. Previously demonstrated right lower lobe calcified granuloma. Diffuse osteopenia. IMPRESSION: Stable cardiomegaly, mild pulmonary vascular congestion and changes of COPD. Electronically Signed   By: Beckie Salts M.D.   On: 01/09/2018 15:41   Dg Swallowing Func-speech Pathology  Result Date: 01/30/2018 Objective Swallowing Evaluation: Type of Study: MBS-Modified Barium Swallow Study  Patient Details Name: Krystal Patrick MRN: [id number] Date of Birth: Dec 03, 1920 Today's Date: 01/30/2018 Time: SLP Start Time (ACUTE ONLY): 4098 -SLP Stop Time (ACUTE ONLY): 0906 SLP Time Calculation (min) (ACUTE ONLY): 31 min Past Medical  History: Past Medical History: Diagnosis Date . AAA (abdominal aortic aneurysm) (HCC)  . CAD (coronary artery disease)  . GERD (gastroesophageal reflux disease)  . Hypertension  Past Surgical History: Past Surgical History: Procedure Laterality Date . CHOLECYSTECTOMY   . SMALL BOWEL REPAIR   HPI: Patient is a 84 yr old female admit with increased cough/congestion from SNF.  Recent admit 01/08/2018 with diagnosis of near syncopal episode and fall at home 5 days prior to admission.  PMH: AAA, HTN, CAD, GERD, depression.  Swallow evaluation ordered.  Family present and reports pt had increased cough over the last few weeks and reported problems swallowing stating it was difficult to get food down and effortful swallow with poor intake.  CXR negative yesterday.  Imaging study showed esophagitis with rec for endoscopy after acute esoph issues resolve.  Pt with respiratory issues during hospital admit and thus required transfer to ICU and Bipap usage - ? fluid overload?  Subjective: pt awake in flouro chair Assessment / Plan / Recommendation CHL IP CLINICAL IMPRESSIONS 01/30/2018 Clinical Impression Patient presents with moderately severe pharyngeal dysphagia with sensorimotor deficits and ? mechanical component due to anterior curvature of spin impacting epiglottic deflection.  Decreased laryngeal elevation/tongue base retraction results in very poor epiglottic deflection and thus significant pharyngeal (vallecular more than pyriform) residuals WITHOUT pt awareness.  Laryngeal penetration of thin/nectar due to decrease pharyngeal clearance noted- but does clear larynx with further swallows.  Cued dry swallows marginally effective to decrease residuals and following solids with liquids with chin tuck help to mitigate.  Barium tablet given with pudding precariously lodged at vallecular region - not clearing with thin and next pudding bolus -  transited to pyriform sinus and 3rd bolus of pudding with dry swallow cleared it  into esophagus.  Pt does not fully clear vallecular space and thus will be high aspiration risk.  Throat clearing and reswallow helpful but also difficult for pt to conduct.  Recommend if pt's pill are of any size to CRUSH them if not contraindicated.  Chin tuck posture decreased laryngeal penetration of minimal even with sequential straw swallows.  Thin via tsp with head neutral was not aspirated or penetrated.  Pt remains aspiration risk due to sensorimotor deficits .  Recommend dys3/ground meats/thin with strict precautions.  SLP to follow up - suspect some chronic component to dysphagia with exacerbation due to deconditioning given pt's sensory deficit.  SLP Visit Diagnosis Dysphagia, pharyngeal phase (R13.13);Dysphagia, pharyngoesophageal phase (R13.14) Attention and concentration deficit following -- Frontal lobe and executive function deficit following -- Impact on safety and function Moderate aspiration risk   [facility] IP TREATMENT RECOMMENDATION 01/30/2018 Treatment Recommendations Therapy as outlined in treatment plan below   Prognosis 01/30/2018 Prognosis for Safe Diet Advancement Good Barriers to Reach Goals Time post onset;Other (Comment) Barriers/Prognosis Comment -- [facility] IP DIET RECOMMENDATION 01/30/2018 SLP Diet Recommendations Dysphagia 3 (Mech soft) solids;Thin liquid Liquid Administration via Straw Medication Administration Crushed with puree Compensations Slow rate;Small sips/bites;Multiple dry swallows after each bite/sip;Effortful swallow;Chin tuck;Use straw to facilitate chin tuck Postural Changes Remain semi-upright after after feeds/meals (Comment);Seated upright at 90 degrees   [facility] IP OTHER RECOMMENDATIONS 01/30/2018 Recommended Consults -- Oral Care Recommendations Oral care BID Other Recommendations --   [facility] IP FOLLOW UP RECOMMENDATIONS 01/30/2018 Follow up Recommendations Skilled Nursing facility   [facility] IP FREQUENCY AND DURATION 01/28/2018 Speech Therapy Frequency (ACUTE ONLY) min 1 x/week Treatment  Duration --      [facility] IP ORAL PHASE 01/30/2018 Oral Phase Impaired Oral - Pudding Teaspoon -- Oral - Pudding Cup -- Oral - Honey Teaspoon -- Oral - Honey Cup -- Oral - Nectar Teaspoon Delayed oral transit;Premature spillage;Piecemeal swallowing Oral - Nectar Cup Delayed oral transit;Premature spillage;Piecemeal swallowing Oral - Nectar Straw Delayed oral transit;Premature spillage;Piecemeal swallowing Oral - Thin Teaspoon Delayed oral transit;Premature spillage;Piecemeal swallowing Oral - Thin Cup Delayed oral transit;Premature spillage;Piecemeal swallowing Oral - Thin Straw Delayed oral transit;Premature spillage;Piecemeal swallowing Oral - Puree Delayed oral transit;Premature spillage;Piecemeal swallowing Oral - Mech Soft Delayed oral transit;Premature spillage Oral - Regular -- Oral - Multi-Consistency -- Oral - Pill Delayed oral transit;Premature spillage Oral Phase - Comment --  [facility] IP PHARYNGEAL PHASE 01/30/2018 Pharyngeal Phase Impaired Pharyngeal- Pudding Teaspoon -- Pharyngeal -- Pharyngeal- Pudding Cup -- Pharyngeal -- Pharyngeal- Honey Teaspoon -- Pharyngeal -- Pharyngeal- Honey Cup -- Pharyngeal -- Pharyngeal- Nectar Teaspoon Reduced epiglottic inversion;Reduced tongue base retraction;Pharyngeal residue - valleculae Pharyngeal -- Pharyngeal- Nectar Cup Reduced epiglottic inversion;Reduced tongue base retraction;Pharyngeal residue - valleculae Pharyngeal -- Pharyngeal- Nectar Straw Reduced epiglottic inversion;Reduced tongue base retraction;Pharyngeal residue - valleculae;Pharyngeal residue - pyriform Pharyngeal -- Pharyngeal- Thin Teaspoon Reduced epiglottic inversion;Reduced tongue base retraction;Pharyngeal residue - valleculae Pharyngeal -- Pharyngeal- Thin Cup Reduced epiglottic inversion;Reduced tongue base retraction;Penetration/Aspiration during swallow;Penetration/Apiration after swallow;Pharyngeal residue - valleculae;Pharyngeal residue - pyriform Pharyngeal Material enters airway, remains ABOVE  vocal cords and not ejected out Pharyngeal- Thin Straw Reduced epiglottic inversion;Reduced tongue base retraction;Penetration/Aspiration during swallow;Penetration/Apiration after swallow;Pharyngeal residue - valleculae;Pharyngeal residue - pyriform Pharyngeal Material enters airway, remains ABOVE vocal cords and not ejected out Pharyngeal- Puree -- Pharyngeal -- Pharyngeal- Mechanical Soft -- Pharyngeal -- Pharyngeal- Regular -- Pharyngeal -- Pharyngeal- Multi-consistency -- Pharyngeal -- Pharyngeal- Pill Reduced  epiglottic inversion;Reduced tongue base retraction;Pharyngeal residue - valleculae;Pharyngeal residue - pyriform Pharyngeal -- Pharyngeal Comment chin tuck posture   CHL IP CERVICAL ESOPHAGEAL PHASE 01/30/2018 Cervical Esophageal Phase Impaired Pudding Teaspoon -- Pudding Cup -- Honey Teaspoon -- Honey Cup -- Nectar Teaspoon -- Nectar Cup -- Nectar Straw -- Thin Teaspoon -- Thin Cup -- Thin Straw -- Puree -- Mechanical Soft -- Regular -- Multi-consistency -- Pill -- Cervical Esophageal Comment appearance of "wide esophagus" with barium coating esophagus, barium tablet lodged at GE, did not clear with multiple boluses of thin or pudding Chales Abrahams 01/30/2018, 9:38 AM  Donavan Burnet, MS [facility] SLP Acute Rehab Services Pager [phone] Office [phone]               10/1: EGD Impression:       - LA Grade D esophagitis. Biopsied.                           - Hiatal hernia. Biopsied.                           - One partially obstructing non-bleeding duodenal                            ulcer with no stigmata of bleeding. There is no                            evidence of perforation.                           - One non-bleeding duodenal ulcer with no stigmata                            of bleeding.                           - Normal second portion of the duodenum.  Recommendation:    - Mechanical soft diet.                           - Continue present medications.                            - Use Prilosec (omeprazole) 40 mg PO BID.                           - No high dose aspirin, ibuprofen, naproxen, or                            other non-steroidal anti-inflammatory drugs.                           - Await pathology results and treat if H.pylori                            positive                           - Repeat Hgb in  1-2 weeks                           - OK to discharge home from GI stanpoint.                           - Follow an antireflux regimen indefinitely. This                            includes:                           - Do not lie down for at least 3 to 4 hours after                            meals.                           - Raise the head of the bed 4 to 6 inches.                           - Decrease excess weight.                           - Avoid citrus juices and other acidic foods,                            alcohol, chocolate, mints, coffee and other                            caffeinated beverages, carbonated beverages, fatty                            and fried foods.                           - Avoid tight-fitting clothing.                           - Avoid cigarettes and other tobacco    Subjective: Some pain around her buttock area. Hard to localized. Also complains of dysuria.  Discharge Exam: Vitals:   02/06/18 2010 02/07/18 0425  BP: 106/60 139/64  Pulse: 78 82  Resp: (!) 22 (!) 22  Temp: 98.5 F (36.9 C) (!) 97.4 F (36.3 C)  SpO2: 91% 96%   Vitals:   02/06/18 0459 02/06/18 1400 02/06/18 2010 02/07/18 0425  BP: 122/65 120/74 106/60 139/64  Pulse: 75 83 78 82  Resp: 17 17 (!) 22 (!) 22  Temp: 98.8 F (37.1 C) (!) 97.5 F (36.4 C) 98.5 F (36.9 C) (!) 97.4 F (36.3 C)  TempSrc: Oral Oral Oral Oral  SpO2: 94% 92% 91% 96%  Weight:      Height:        General: Pt is alert, awake, not in acute distress Cardiovascular: RRR, S1/S2 +, no rubs, no gallops Respiratory: CTA bilaterally, no wheezing, no  rhonchi Abdominal: Soft, NT, ND, bowel sounds + Extremities: no edema, no cyanosis    The results  of significant diagnostics from this hospitalization (including imaging, microbiology, ancillary and laboratory) are listed below for reference.     Microbiology: No results found for this or any previous visit (from the past 240 hour(s)).   Labs: BNP (last 3 results) Recent Labs    01/08/18 1941  BNP 109.6*   Basic Metabolic Panel: Recent Labs  Lab 02/02/18 0930 02/03/18 1618 02/06/18 1327 02/07/18 0552  NA 146* 144 142 139  K 3.9 4.1 5.8* 4.5  CL 103 102 104 101  CO2 31 30 29 29   GLUCOSE 124* 154* 99 106*  BUN 26* 24* 26* 21  CREATININE 0.78 0.81 0.84 0.75  CALCIUM 8.6* 8.6* 8.3* 8.0*   CBC: Recent Labs  Lab 02/03/18 1618  WBC 11.1*  HGB 13.2  HCT 42.2  MCV 94.0  PLT 304   Urinalysis    Component Value Date/Time   COLORURINE YELLOW 01/26/2018 2251   APPEARANCEUR CLEAR 01/26/2018 2251   LABSPEC 1.017 01/26/2018 2251   PHURINE 5.0 01/26/2018 2251   GLUCOSEU NEGATIVE 01/26/2018 2251   HGBUR NEGATIVE 01/26/2018 2251   BILIRUBINUR NEGATIVE 01/26/2018 2251   KETONESUR NEGATIVE 01/26/2018 2251   PROTEINUR NEGATIVE 01/26/2018 2251   NITRITE NEGATIVE 01/26/2018 2251   LEUKOCYTESUR NEGATIVE 01/26/2018 2251    SIGNED:   Jacquelin Hawking, MD [facility] 02/07/2018, 11:35 AM

## 2018-02-06 NOTE — Progress Notes (Signed)
CSW contacted [facility] as patient is expected to discharge today. FL2 sent to [facility] and will send Discharge Summary to [facility] when it becomes available, afterward [facility] will provide bed assignment and phone number to call for report.  Enid Cutter, LCSW-A Clinical Social Worker [phone]

## 2018-02-06 NOTE — [place] FL2 (Signed)
[place] MEDICAID FL2 LEVEL OF CARE SCREENING TOOL     IDENTIFICATION  Patient Name: Krystal Patrick Birthdate: [birth year] Sex: female Admission Date (Current Location): 01/26/2018  [facility] and [place] Number:  Producer, television/film/video and Address:  [facility],  [address] [place], [place] [address]      Provider Number: [id number]  Attending Physician Name and Address:  Narda Bonds, MD  Relative Name and Phone Number:  Judie Bonus: [phone]    Current Level of Care: Hospital Recommended Level of Care: Skilled Nursing Facility Prior Approval Number:    Date Approved/Denied:   PASRR Number: [id number]  Discharge Plan: SNF    Current Diagnoses: Patient Active Problem List   Diagnosis Date Noted  . Abnormal CT scan, gastrointestinal tract   . SIRS (systemic inflammatory response syndrome) (HCC) 01/27/2018  . Hypotension 01/27/2018  . Acute blood loss anemia 01/27/2018  . Fall at home, initial encounter 01/09/2018  . Near syncope 01/09/2018  . Syncope 01/09/2018  . Pressure injury of skin 05/31/2017  . UTI (urinary tract infection) 05/30/2017  . Acute metabolic encephalopathy 05/30/2017  . Hypertensive urgency 05/30/2017  . Depression 05/30/2017  . Hypertension   . CAD (coronary artery disease)   . GERD (gastroesophageal reflux disease)   . AAA (abdominal aortic aneurysm) without rupture (HCC) 09/06/2014    Orientation RESPIRATION BLADDER Height & Weight     Self, Time, Situation, Place  Normal External catheter Weight: 125 lb 10.6 oz (57 kg) Height:  5' (152.4 cm)  BEHAVIORAL SYMPTOMS/MOOD NEUROLOGICAL BOWEL NUTRITION STATUS      Continent Diet(Regular)  AMBULATORY STATUS COMMUNICATION OF NEEDS Skin   Extensive Assist Verbally Normal                       Personal Care Assistance Level of Assistance  Bathing, Feeding, Dressing Bathing Assistance: Maximum assistance Feeding assistance: Independent Dressing Assistance: Maximum  assistance     Functional Limitations Info    Sight Info: Impaired Hearing Info: Adequate Speech Info: Adequate    SPECIAL CARE FACTORS FREQUENCY  PT (By licensed PT), OT (By licensed OT)     PT Frequency: 3x OT Frequency: 3x            Contractures Contractures Info: Not present    Additional Factors Info  Code Status, Allergies Code Status Info: FULL Allergies Info: Penicillins           Current Medications (02/06/2018):  This is the current hospital active medication list Current Facility-Administered Medications  Medication Dose Route Frequency Provider Last Rate Last Dose  . 0.9 %  sodium chloride infusion (Manually program via Guardrails IV Fluids)   Intravenous Once Kathlen Mody, MD      . acetaminophen (TYLENOL) tablet 650 mg  650 mg Oral Q6H PRN Kathlen Mody, MD       Or  . acetaminophen (TYLENOL) suppository 650 mg  650 mg Rectal Q6H PRN Kathlen Mody, MD      . albuterol (PROVENTIL) (2.5 MG/3ML) 0.083% nebulizer solution 2.5 mg  2.5 mg Nebulization Q6H PRN Kathlen Mody, MD      . capsaicin (ZOSTRIX) 0.025 % cream 1 application  1 application Topical Daily Kathlen Mody, MD   1 application at 02/05/18 0919  . feeding supplement (ENSURE ENLIVE) (ENSURE ENLIVE) liquid 237 mL  237 mL Oral BID BM Kathlen Mody, MD   237 mL at 02/06/18 0917  . gabapentin (NEURONTIN) capsule 300 mg  300 mg Oral Daily  Kathlen Mody, MD   300 mg at 02/06/18 1610  . hydrocortisone (ANUSOL-HC) suppository 25 mg  25 mg Rectal BID PRN Narda Bonds, MD      . magnesium hydroxide (MILK OF MAGNESIA) suspension 30 mL  30 mL Oral Daily PRN Kathlen Mody, MD   30 mL at 02/05/18 1613  . Melatonin TABS 5 mg  5 mg Oral QHS PRN Kathlen Mody, MD   5 mg at 02/04/18 2244  . metoprolol tartrate (LOPRESSOR) injection 5 mg  5 mg Intravenous Q6H PRN Kathlen Mody, MD      . ondansetron (ZOFRAN) tablet 4 mg  4 mg Oral Q6H PRN Kathlen Mody, MD       Or  . ondansetron (ZOFRAN) injection 4 mg  4 mg  Intravenous Q6H PRN Kathlen Mody, MD      . pantoprazole (PROTONIX) EC tablet 40 mg  40 mg Oral BID AC Kathlen Mody, MD   40 mg at 02/06/18 0917  . potassium chloride SA (K-DUR,KLOR-CON) CR tablet 40 mEq  40 mEq Oral Daily Barrett, Rhonda G, PA-C   40 mEq at 02/06/18 0917  . RESOURCE THICKENUP CLEAR   Oral PRN Kathlen Mody, MD      . sodium chloride flush (NS) 0.9 % injection 10-40 mL  10-40 mL Intracatheter Q12H Kathlen Mody, MD   10 mL at 02/06/18 0918  . sodium chloride flush (NS) 0.9 % injection 10-40 mL  10-40 mL Intracatheter PRN Kathlen Mody, MD   10 mL at 02/05/18 1437  . sodium chloride flush (NS) 0.9 % injection 3 mL  3 mL Intravenous Q12H Kathlen Mody, MD   3 mL at 02/06/18 0918  . traMADol (ULTRAM) tablet 25 mg  25 mg Oral Q12H PRN Kathlen Mody, MD   25 mg at 02/05/18 1833     Discharge Medications: Please see discharge summary for a list of discharge medications.  Relevant Imaging Results:  Relevant Lab Results:   Additional Information SSN: [id number]  Darreld Mclean, [place]

## 2018-02-06 NOTE — Progress Notes (Addendum)
Progress Note  Patient Name: Krystal Patrick Date of Encounter: 02/06/2018  Primary Cardiologist: Rollene Rotunda, MD   Subjective   The patient appears confused, denies any symptoms.  Inpatient Medications    Scheduled Meds: . sodium chloride   Intravenous Once  . capsaicin  1 application Topical Daily  . feeding supplement (ENSURE ENLIVE)  237 mL Oral BID BM  . gabapentin  300 mg Oral Daily  . pantoprazole  40 mg Oral BID AC  . potassium chloride  40 mEq Oral Daily  . sodium chloride flush  10-40 mL Intracatheter Q12H  . sodium chloride flush  3 mL Intravenous Q12H   Continuous Infusions:  PRN Meds: acetaminophen **OR** acetaminophen, albuterol, hydrocortisone, magnesium hydroxide, Melatonin, metoprolol tartrate, ondansetron **OR** ondansetron (ZOFRAN) IV, RESOURCE THICKENUP CLEAR, sodium chloride flush, traMADol   Vital Signs    Vitals:   02/05/18 1339 02/05/18 1400 02/05/18 2050 02/06/18 0459  BP: (!) 100/50 110/60 98/64 122/65  Pulse:   78 75  Resp: 20  16 17   Temp: 98.1 F (36.7 C)  98.1 F (36.7 C) 98.8 F (37.1 C)  TempSrc:   Oral Oral  SpO2:   92% 94%  Weight:      Height:        Intake/Output Summary (Last 24 hours) at 02/06/2018 0818 Last data filed at 02/06/2018 0500 Gross per 24 hour  Intake 383 ml  Output 1000 ml  Net -617 ml   Filed Weights   01/26/18 2126  Weight: 57 kg    Telemetry    SR w/ several episodes ?atrial tach, PACs - Personally Reviewed  ECG    No new tracings - Personally Reviewed  Physical Exam   General: Well developed, frail, elderly, female in no acute distress Head: Eyes PERRLA, No xanthomas.   Normocephalic and atraumatic  Lungs: rales bases bilaterally to auscultation. Heart: HRRR S1 S2, without RG. +SEM.  Pulses are 2+ & equal. No carotid bruit. JVD 10 cm. Abdomen: Bowel sounds are present, abdomen soft and non-tender without masses or  hernias noted. Msk: Weak strength and tone for age. Extremities: No  clubbing, cyanosis or edema.    Skin:  No rashes or lesions noted. Neuro: Alert and oriented X 3. Psych:  Good affect, responds appropriately   Labs    Chemistry Recent Labs  Lab 01/31/18 0530 02/02/18 0930 02/03/18 1618  NA 142 146* 144  K 4.0 3.9 4.1  CL 104 103 102  CO2 29 31 30   GLUCOSE 104* 124* 154*  BUN 23 26* 24*  CREATININE 0.77 0.78 0.81  CALCIUM 8.7* 8.6* 8.6*  GFRNONAA >60 >60 60*  GFRAA >60 >60 >60  ANIONGAP 9 12 12      Hematology Recent Labs  Lab 01/31/18 0530 02/03/18 1618  WBC 10.4 11.1*  RBC 4.40 4.49  HGB 13.0 13.2  HCT 39.1 42.2  MCV 88.9 94.0  MCH 29.5 29.4  MCHC 33.2 31.3  RDW 15.4 15.0  PLT 308 304    Cardiac Enzymes No results for input(s): TROPONINI in the last 168 hours.   Radiology    No results found.  Cardiac Studies   Echo 01/09/18: Study Conclusions - Left ventricle: The cavity size was normal. Wall thickness was   normal. Systolic function was vigorous. The estimated ejection   fraction was in the range of 65% to 70%. Wall motion was normal;   there were no regional wall motion abnormalities. Doppler   parameters are consistent with abnormal left ventricular  relaxation (grade 1 diastolic dysfunction). - Aortic valve: There was no stenosis. - Mitral valve: Moderately calcified annulus. There was trivial   regurgitation. - Right ventricle: The cavity size was normal. Systolic function   was normal. - Tricuspid valve: Peak RV-RA gradient (S): 60 mm Hg. - Pulmonary arteries: PA peak pressure: 63 mm Hg (S). - Inferior vena cava: The vessel was normal in size. The   respirophasic diameter changes were in the normal range (= 50%),   consistent with normal central venous pressure.  Impressions: - Normal LV size with EF 65-70%. Normal RV size and systolic   function. No significant valvular abnormalities. Moderate   pulmonary hypertension.  Patient Profile     84 y.o. female w/ hx AAA, GERD, reported CAD but no  documentation, of this, HTN, was admitted 09/23 w/ SIRS, ?UGIB, hypotension, rapid atrial fib.  She has also had sinus bradycardia observed.  Assessment & Plan    1. Sinus bradycardia - may have been vagal - resolved - no AV nodal blocking agents - no indication for PPM  2. Paroxysmal atrial tachycardia - asymptomatic - no meds needed  3. Anemia - EGD 09/30 w/ grade D esophagitis, HH, 2 duodenal ulcers - Per GI/IM  4. Acute respiratory failure, pulmonary HTN, chronic diastolic heart failure - diuresed w/ IV Lasix 20 mg qd, volume improved initially but worsening now - no Lasix since 09/30 - pt was diuresing on 20 mg IV lasix daily, will restart x 3 days - no weights and I/O incomplete  5. Elevated troponin - felt 2nd demand ischemia - no additional workup planned  [facility] will sign off.   Medication Recommendations:  As above Other recommendations (labs, testing, etc):  No further testing Follow up as an outpatient:  As needed  Tobias Alexander, MD 02/06/2018

## 2018-02-07 LAB — BASIC METABOLIC PANEL
Anion gap: 9 (ref 5–15)
BUN: 21 mg/dL (ref 8–23)
CALCIUM: 8 mg/dL — AB (ref 8.9–10.3)
CO2: 29 mmol/L (ref 22–32)
CREATININE: 0.75 mg/dL (ref 0.44–1.00)
Chloride: 101 mmol/L (ref 98–111)
GFR calc Af Amer: >60 mL/min (ref >60)
Glucose, Bld: 106 mg/dL — ABNORMAL HIGH (ref 70–99)
Potassium: 4.5 mmol/L (ref 3.5–5.1)
SODIUM: 139 mmol/L (ref 135–145)

## 2018-02-07 MED ORDER — GABAPENTIN 300 MG PO CAPS: 300.0000 mg | ORAL_CAPSULE | Freq: Every day | ORAL | Status: AC

## 2018-02-07 NOTE — Clinical Social Work Note (Addendum)
CSW spoke with [facility] and they can accept patient back today.  Patient to be d/c'ed today to [facility] room 209.  Patient and family agreeable to plans will transport via ems RN to call report to [phone].  CSW spoke with patient's daughter Cathlyn Parsons [phone], who is aware that patient will be discharging back to SNF today.   Ervin Knack. Richetta Cubillos, MSW, [facility] [phone]  02/07/2018 11:28 AM

## 2018-02-07 NOTE — Clinical Social Work Note (Addendum)
CSW received phone call that patient's daughter wanted a phone call back, CSW attempted to call her at [phone] and left a message on voice mail.  Awaiting for call back.  11:36am  CSW spoke to patient's daughter, she was inquiring if patient is ready for discharge today.  CSW was informed by bedside nurse that physician would like to discharge patient today back to [facility], CSW informed patient's daughter that as long as she is medically ready for discharge and the orders have been received, patient will be discharging today.  Ervin Knack. Gala Padovano, MSW, [facility] [phone]  02/07/2018 10:53 AM

## 2018-02-07 NOTE — Discharge Instructions (Signed)
Krystal Patrick,  You were admitted because of confusion. This was likely from your gabapentin. During your stay, however, you developed heart failure and needed lasix to remove excess fluid. You also had complications with your heart rhythm being too fast and also too slow. The cardiologists evaluated you and recommended no pacemaker. They have also recommended that you do not use beta blocker or calcium channel blocker medications. Physical therapy thought it would benefit you to go back to rehab before going home to get stronger.

## 2018-02-07 NOTE — Progress Notes (Signed)
Report given to [facility] at [employer].  All questions answered.

## 2018-02-09 LAB — URINE CULTURE: Culture: 40000 — AB

## 2018-03-16 ENCOUNTER — Encounter (HOSPITAL_COMMUNITY): Payer: Self-pay | Admitting: Gastroenterology

## 2018-05-06 DEATH — deceased

## 2018-10-17 IMAGING — CT CT ABD-PELV W/ CM
2 of 6 series · 15 of 46 positions shown, 17 images · IV contrast (ISOVUE)
Comparison: Chest CT dated 01/08/2018

CLINICAL DATA: [AGE] female with sepsis.

EXAM:
CT ABDOMEN AND PELVIS WITH CONTRAST
TECHNIQUE: Multidetector CT imaging of the abdomen and pelvis was performed
using the standard protocol following bolus administration of
intravenous contrast.
CONTRAST:  75mL OMNIPAQUE IOHEXOL 300 MG/ML  SOLN

[Series 3: axial st · axial · 0.68mm/px · z∈[-701,-376]mm · 12 of 77 slices shown, 14 images]
[im 6/77  soft-tissue]
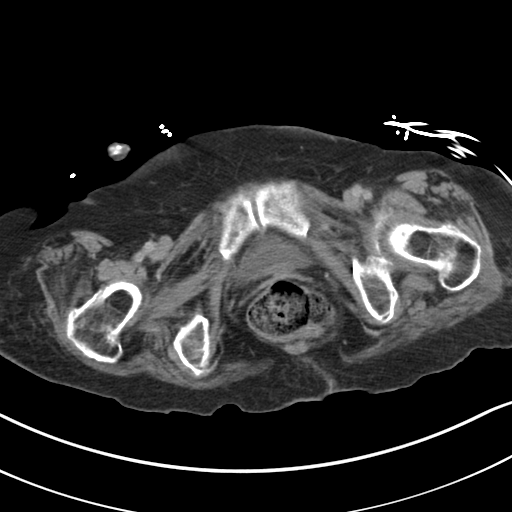
[im 6/77  bone]
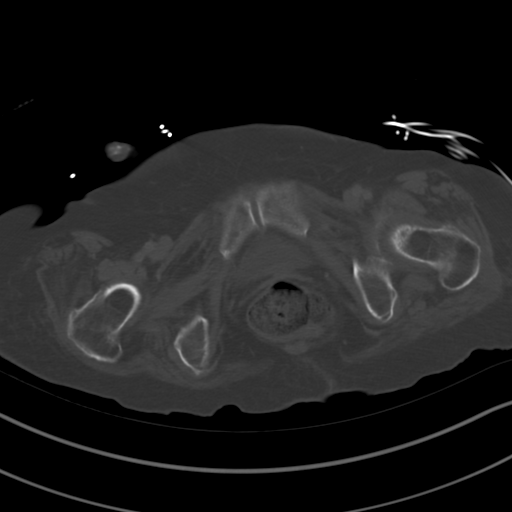
[im 11/77  soft-tissue]
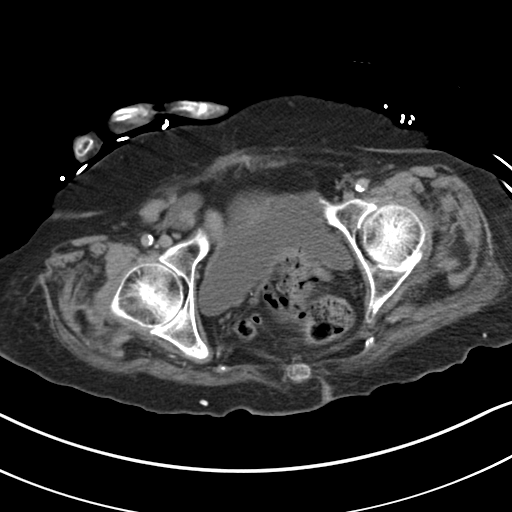
[im 16/77  soft-tissue]
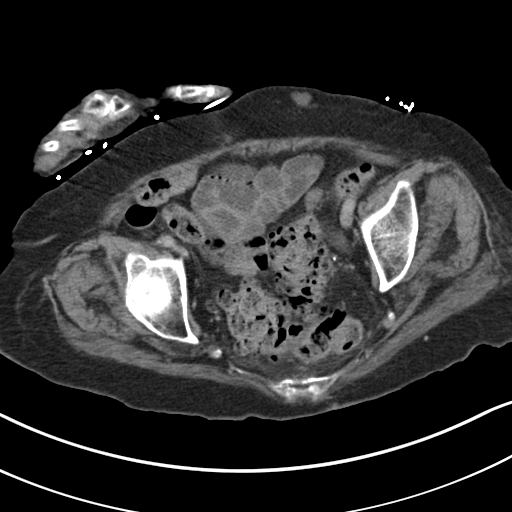
[im 26/77  soft-tissue]
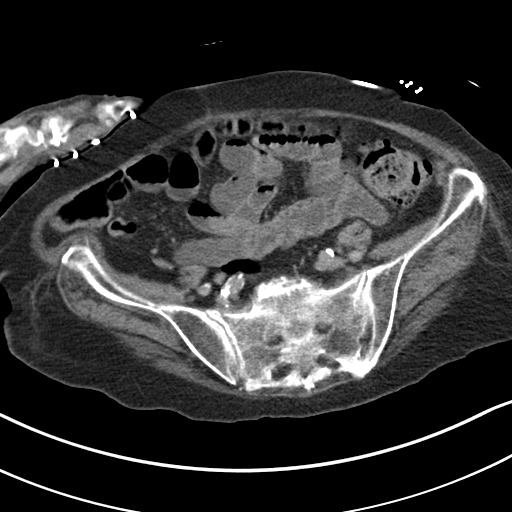
[im 31/77  soft-tissue]
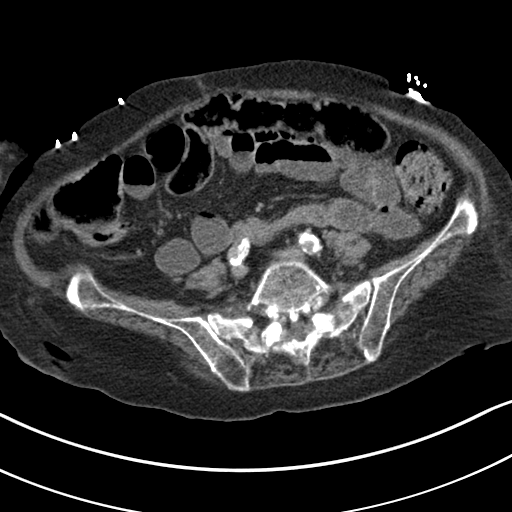
[im 36/77  soft-tissue]
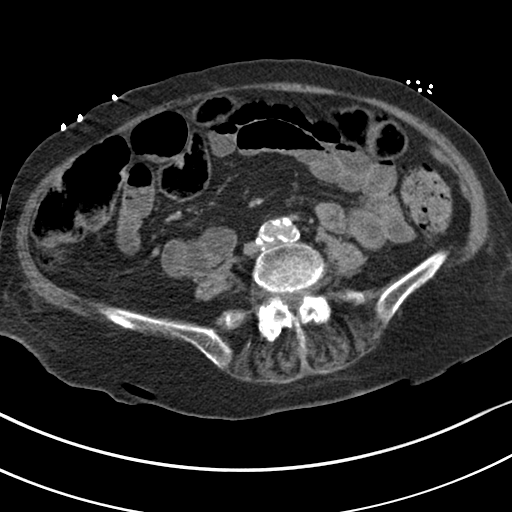
[im 41/77  soft-tissue]
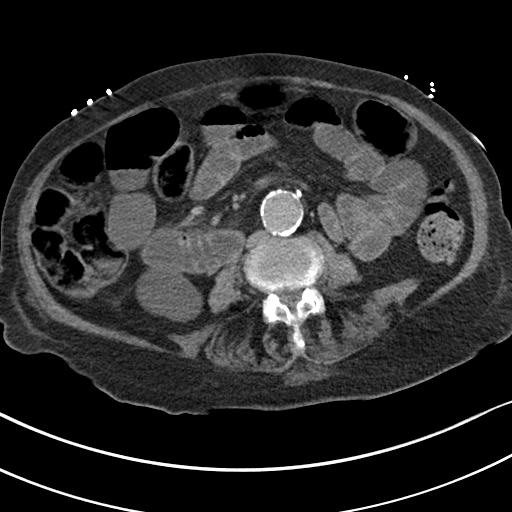
[im 46/77  soft-tissue]
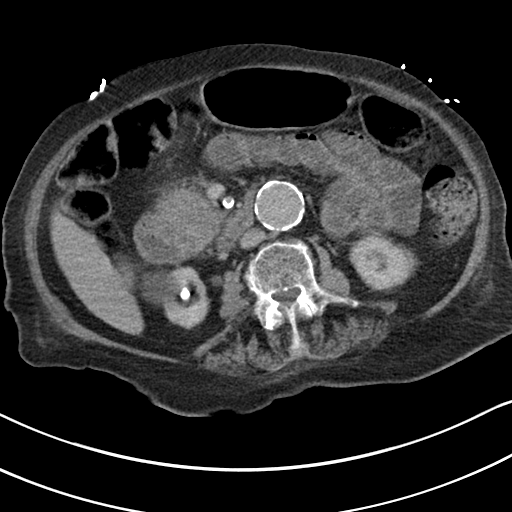
[im 51/77  soft-tissue]
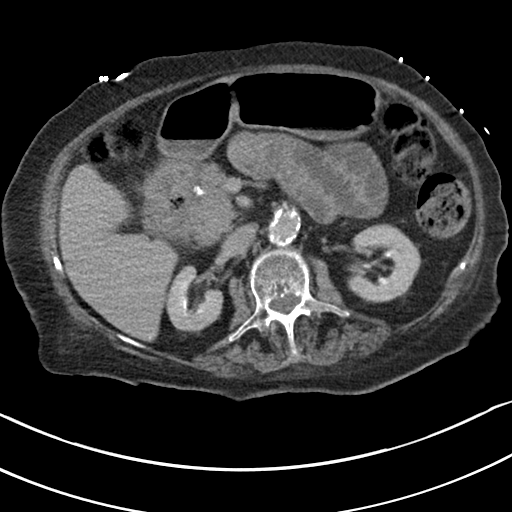
[im 51/77  bone]
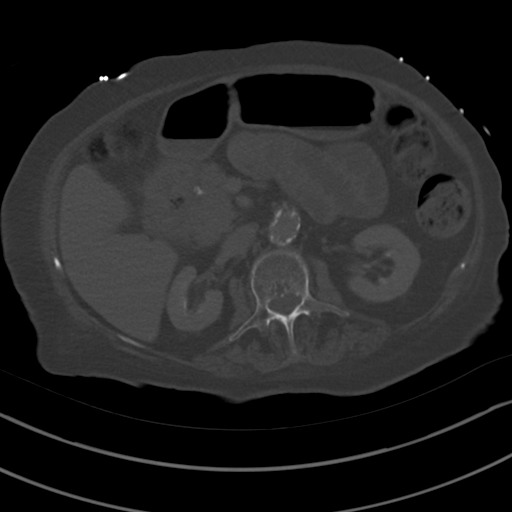
[im 61/77  soft-tissue]
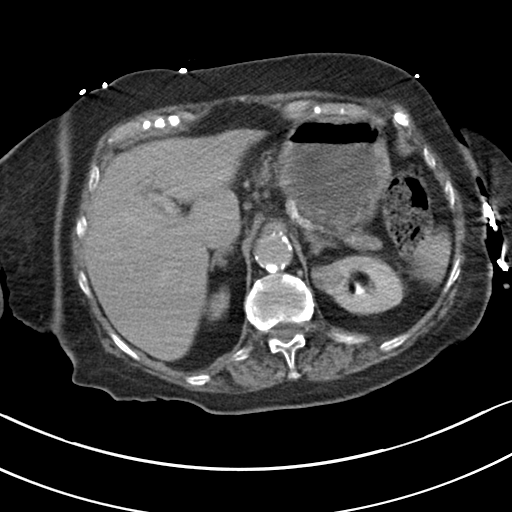
[im 66/77  soft-tissue]
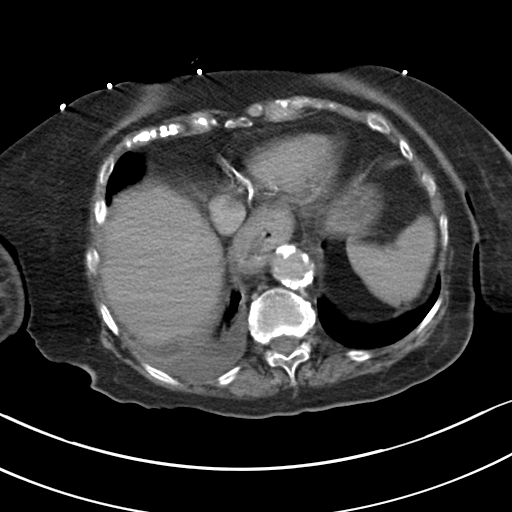
[im 71/77  soft-tissue]
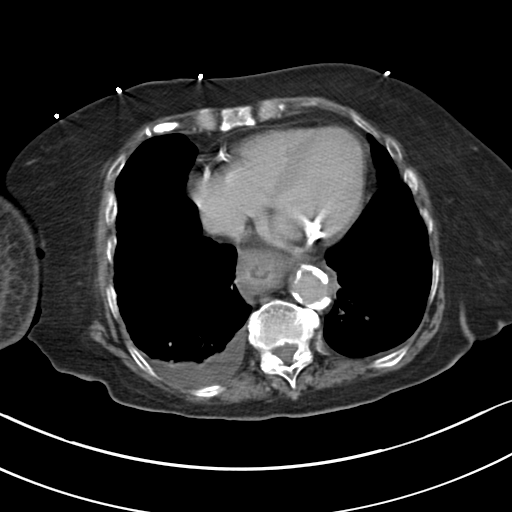

[Series 8: coronal st · coronal · 0.68mm/px · 3 of 83 slices shown]
[im 28/83  soft-tissue]
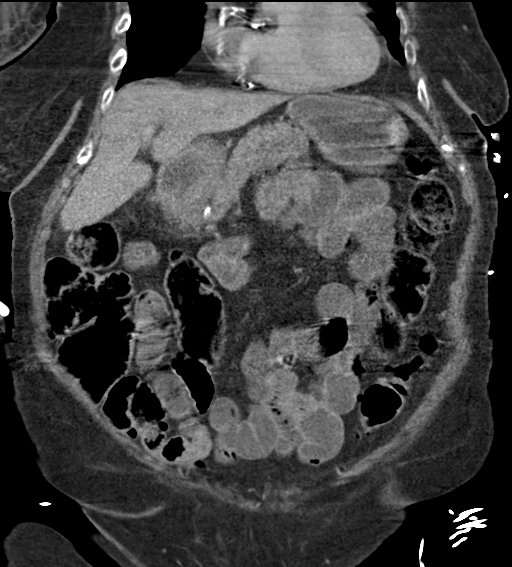
[im 37/83  soft-tissue]
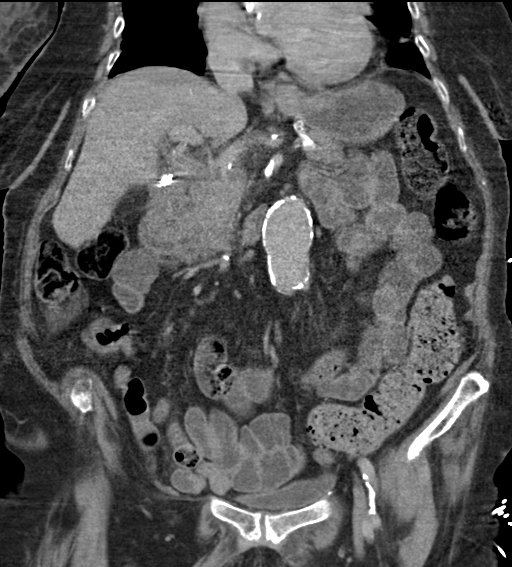
[im 46/83  soft-tissue]
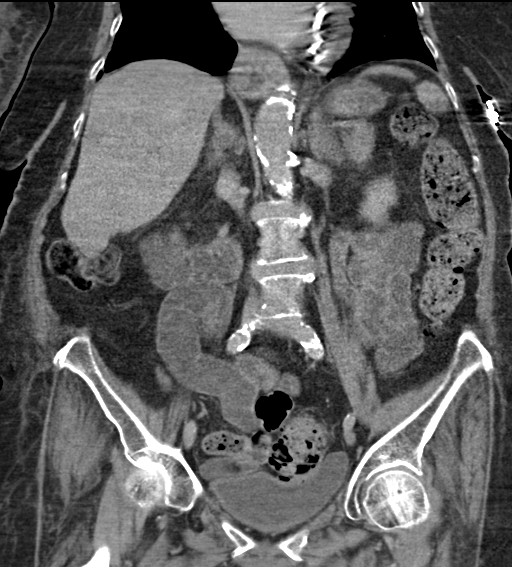

[15 of 46 positions shown; findings below may reference images not displayed]

FINDINGS: Evaluation of this exam is limited in the absence of intravenous
contrast.

Lower chest: Partially visualized small right pleural effusion and
subsegmental right lung base atelectatic changes. Calcified right
lung base granuloma. Multi vessel coronary vascular calcification
and calcification of the mitral annulus.

No intra-abdominal free air or free fluid.

Hepatobiliary: The liver is unremarkable. No intrahepatic biliary
ductal dilatation. Cholecystectomy.

Pancreas: Unremarkable. No pancreatic ductal dilatation or
surrounding inflammatory changes.

Spleen: Normal in size without focal abnormality.

Adrenals/Urinary Tract: The adrenal glands are unremarkable. There
is no hydronephrosis or nephrolithiasis on either side. There is a 5
cm right renal inferior pole cyst. There is symmetric enhancement
and excretion of contrast by both kidneys. A 2.5 cm hypodense lesion
arising from the superior pole of the left kidney is not well
characterized but may represent a complex or septated cyst. This can
be further evaluated with ultrasound or MRI on a nonemergent basis.
Duplicated bilateral renal collecting systems. The urinary bladder
is unremarkable.

Stomach/Bowel: There is diffuse circumferential thickening of the
esophagus most consistent with esophagitis. There is apparent
thickening of the gastroesophageal junction which may be
inflammatory in nature although a neoplasm is not excluded. Further
evaluation with endoscopy after resolution of active inflammatory
changes of the esophagus is recommended. Small pocket of gas along
the medial wall of the second portion of the duodenum is
suboptimally evaluated but may represent gas within the lumen
although duodenal diverticulum or ulcer is not excluded. Evaluation
of this area is very limited on this CT. There is extensive sigmoid
diverticulosis without active inflammatory changes. Large amount of
stool noted in the distal colon. There is no bowel obstruction.

Vascular/Lymphatic: Advanced aortoiliac atherosclerotic disease.
There is a 3.5 cm infrarenal aortic aneurysm.

Reproductive: Hysterectomy.  No pelvic mass.

Other: None

Musculoskeletal: Osteopenia with degenerative changes of the spine.
Grade 1 L4-L5 anterolisthesis. Probable old healed sacral fracture.
No acute osseous pathology.
IMPRESSION: 1. Circumferential thickening and inflammation of the esophagus most
consistent with esophagitis. Soft tissue thickening of the
gastroesophageal junction may be inflammatory or neoplastic. Further
evaluation with endoscopy following resolution of the acute
esophageal inflammation recommended.
2. Possible diverticula of the medial wall of the second portion of
the duodenum.
3. Extensive sigmoid diverticulosis.  No bowel obstruction.
4. Advanced choose 1
5. A 3.5 cm infrarenal aortic aneurysm. Recommend followup by
ultrasound in 2 years. This recommendation follows ACR consensus
guidelines: White Paper of the ACR Incidental Findings Committee II
